# Patient Record
Sex: Male | Born: 2000 | Race: White | Hispanic: No | Marital: Single | Smoking: Current some day smoker
Health system: Southern US, Community
[De-identification: ages and names within clinical notes are randomized; demographics above are authoritative.]

## PROBLEM LIST (undated history)

## (undated) DIAGNOSIS — J45909 Unspecified asthma, uncomplicated: Secondary | ICD-10-CM

## (undated) DIAGNOSIS — Z8489 Family history of other specified conditions: Secondary | ICD-10-CM

## (undated) DIAGNOSIS — K219 Gastro-esophageal reflux disease without esophagitis: Secondary | ICD-10-CM

---
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
## 2023-08-25 ENCOUNTER — Ambulatory Visit: Admitting: Podiatry

## 2024-01-24 ENCOUNTER — Other Ambulatory Visit: Payer: Self-pay

## 2024-01-24 ENCOUNTER — Encounter (HOSPITAL_COMMUNITY): Payer: Self-pay

## 2024-01-24 ENCOUNTER — Emergency Department (HOSPITAL_COMMUNITY)

## 2024-01-24 DIAGNOSIS — E872 Acidosis, unspecified: Secondary | ICD-10-CM | POA: Diagnosis present

## 2024-01-24 DIAGNOSIS — S82401B Unspecified fracture of shaft of right fibula, initial encounter for open fracture type I or II: Secondary | ICD-10-CM | POA: Diagnosis present

## 2024-01-24 DIAGNOSIS — S93122A Dislocation of metatarsophalangeal joint of left great toe, initial encounter: Secondary | ICD-10-CM | POA: Diagnosis present

## 2024-01-24 DIAGNOSIS — S8252XA Displaced fracture of medial malleolus of left tibia, initial encounter for closed fracture: Secondary | ICD-10-CM | POA: Diagnosis present

## 2024-01-24 DIAGNOSIS — T07XXXA Unspecified multiple injuries, initial encounter: Secondary | ICD-10-CM | POA: Diagnosis not present

## 2024-01-24 DIAGNOSIS — D72829 Elevated white blood cell count, unspecified: Secondary | ICD-10-CM | POA: Diagnosis present

## 2024-01-24 DIAGNOSIS — S72001A Fracture of unspecified part of neck of right femur, initial encounter for closed fracture: Secondary | ICD-10-CM | POA: Diagnosis not present

## 2024-01-24 DIAGNOSIS — S72322A Displaced transverse fracture of shaft of left femur, initial encounter for closed fracture: Secondary | ICD-10-CM | POA: Diagnosis present

## 2024-01-24 DIAGNOSIS — S36031A Moderate laceration of spleen, initial encounter: Secondary | ICD-10-CM | POA: Diagnosis present

## 2024-01-24 DIAGNOSIS — K5900 Constipation, unspecified: Secondary | ICD-10-CM | POA: Diagnosis not present

## 2024-01-24 DIAGNOSIS — S2243XA Multiple fractures of ribs, bilateral, initial encounter for closed fracture: Secondary | ICD-10-CM | POA: Diagnosis present

## 2024-01-24 DIAGNOSIS — S32059A Unspecified fracture of fifth lumbar vertebra, initial encounter for closed fracture: Secondary | ICD-10-CM | POA: Diagnosis present

## 2024-01-24 DIAGNOSIS — E559 Vitamin D deficiency, unspecified: Secondary | ICD-10-CM | POA: Diagnosis present

## 2024-01-24 DIAGNOSIS — R339 Retention of urine, unspecified: Secondary | ICD-10-CM | POA: Diagnosis not present

## 2024-01-24 DIAGNOSIS — S72351A Displaced comminuted fracture of shaft of right femur, initial encounter for closed fracture: Secondary | ICD-10-CM | POA: Diagnosis present

## 2024-01-24 DIAGNOSIS — S72401A Unspecified fracture of lower end of right femur, initial encounter for closed fracture: Secondary | ICD-10-CM

## 2024-01-24 DIAGNOSIS — Z91040 Latex allergy status: Secondary | ICD-10-CM

## 2024-01-24 DIAGNOSIS — N179 Acute kidney failure, unspecified: Secondary | ICD-10-CM | POA: Diagnosis present

## 2024-01-24 DIAGNOSIS — R000 Tachycardia, unspecified: Secondary | ICD-10-CM | POA: Diagnosis not present

## 2024-01-24 DIAGNOSIS — Y92410 Unspecified street and highway as the place of occurrence of the external cause: Secondary | ICD-10-CM | POA: Diagnosis not present

## 2024-01-24 DIAGNOSIS — D696 Thrombocytopenia, unspecified: Secondary | ICD-10-CM | POA: Diagnosis not present

## 2024-01-24 DIAGNOSIS — S82201B Unspecified fracture of shaft of right tibia, initial encounter for open fracture type I or II: Secondary | ICD-10-CM

## 2024-01-24 DIAGNOSIS — S92322A Displaced fracture of second metatarsal bone, left foot, initial encounter for closed fracture: Secondary | ICD-10-CM | POA: Diagnosis present

## 2024-01-24 DIAGNOSIS — M79606 Pain in leg, unspecified: Secondary | ICD-10-CM | POA: Diagnosis not present

## 2024-01-24 DIAGNOSIS — S27321A Contusion of lung, unilateral, initial encounter: Secondary | ICD-10-CM | POA: Diagnosis present

## 2024-01-24 DIAGNOSIS — F418 Other specified anxiety disorders: Secondary | ICD-10-CM | POA: Diagnosis not present

## 2024-01-24 DIAGNOSIS — D62 Acute posthemorrhagic anemia: Secondary | ICD-10-CM | POA: Diagnosis present

## 2024-01-24 DIAGNOSIS — S92332A Displaced fracture of third metatarsal bone, left foot, initial encounter for closed fracture: Secondary | ICD-10-CM | POA: Diagnosis present

## 2024-01-24 DIAGNOSIS — E861 Hypovolemia: Secondary | ICD-10-CM | POA: Diagnosis present

## 2024-01-24 DIAGNOSIS — S42322A Displaced transverse fracture of shaft of humerus, left arm, initial encounter for closed fracture: Secondary | ICD-10-CM | POA: Diagnosis present

## 2024-01-24 DIAGNOSIS — R52 Pain, unspecified: Secondary | ICD-10-CM | POA: Diagnosis not present

## 2024-01-24 DIAGNOSIS — I959 Hypotension, unspecified: Secondary | ICD-10-CM | POA: Diagnosis present

## 2024-01-24 DIAGNOSIS — Z79899 Other long term (current) drug therapy: Secondary | ICD-10-CM

## 2024-01-24 DIAGNOSIS — K5901 Slow transit constipation: Secondary | ICD-10-CM | POA: Diagnosis not present

## 2024-01-24 DIAGNOSIS — S8251XB Displaced fracture of medial malleolus of right tibia, initial encounter for open fracture type I or II: Secondary | ICD-10-CM | POA: Diagnosis present

## 2024-01-24 DIAGNOSIS — S82041A Displaced comminuted fracture of right patella, initial encounter for closed fracture: Secondary | ICD-10-CM | POA: Diagnosis present

## 2024-01-24 DIAGNOSIS — S82142A Displaced bicondylar fracture of left tibia, initial encounter for closed fracture: Secondary | ICD-10-CM | POA: Diagnosis present

## 2024-01-24 DIAGNOSIS — S36892A Contusion of other intra-abdominal organs, initial encounter: Secondary | ICD-10-CM | POA: Diagnosis present

## 2024-01-24 DIAGNOSIS — S36039A Unspecified laceration of spleen, initial encounter: Secondary | ICD-10-CM

## 2024-01-24 DIAGNOSIS — S069XAD Unspecified intracranial injury with loss of consciousness status unknown, subsequent encounter: Secondary | ICD-10-CM | POA: Diagnosis not present

## 2024-01-24 DIAGNOSIS — S42191A Fracture of other part of scapula, right shoulder, initial encounter for closed fracture: Secondary | ICD-10-CM | POA: Diagnosis present

## 2024-01-24 DIAGNOSIS — M21332 Wrist drop, left wrist: Secondary | ICD-10-CM | POA: Diagnosis not present

## 2024-01-24 DIAGNOSIS — S42309A Unspecified fracture of shaft of humerus, unspecified arm, initial encounter for closed fracture: Secondary | ICD-10-CM | POA: Diagnosis present

## 2024-01-24 DIAGNOSIS — F4321 Adjustment disorder with depressed mood: Secondary | ICD-10-CM | POA: Diagnosis not present

## 2024-01-24 DIAGNOSIS — F40240 Claustrophobia: Secondary | ICD-10-CM | POA: Diagnosis present

## 2024-01-24 DIAGNOSIS — Z887 Allergy status to serum and vaccine status: Secondary | ICD-10-CM

## 2024-01-24 DIAGNOSIS — S42302A Unspecified fracture of shaft of humerus, left arm, initial encounter for closed fracture: Secondary | ICD-10-CM

## 2024-01-24 DIAGNOSIS — R4189 Other symptoms and signs involving cognitive functions and awareness: Secondary | ICD-10-CM | POA: Diagnosis present

## 2024-01-24 DIAGNOSIS — S92342A Displaced fracture of fourth metatarsal bone, left foot, initial encounter for closed fracture: Secondary | ICD-10-CM | POA: Diagnosis present

## 2024-01-24 DIAGNOSIS — S72402A Unspecified fracture of lower end of left femur, initial encounter for closed fracture: Secondary | ICD-10-CM

## 2024-01-24 DIAGNOSIS — G5632 Lesion of radial nerve, left upper limb: Secondary | ICD-10-CM | POA: Diagnosis present

## 2024-01-24 DIAGNOSIS — F17290 Nicotine dependence, other tobacco product, uncomplicated: Secondary | ICD-10-CM | POA: Diagnosis present

## 2024-01-24 HISTORY — DX: Family history of other specified conditions: Z84.89

## 2024-01-24 LAB — COMPREHENSIVE METABOLIC PANEL WITH GFR
ALT: 732 U/L — ABNORMAL HIGH (ref 0–44)
AST: 488 U/L — ABNORMAL HIGH (ref 15–41)
Albumin: 4 g/dL (ref 3.5–5.0)
Alkaline Phosphatase: 68 U/L (ref 38–126)
Anion gap: 16 — ABNORMAL HIGH (ref 5–15)
BUN: 15 mg/dL (ref 6–20)
CO2: 19 mmol/L — ABNORMAL LOW (ref 22–32)
Calcium: 8.2 mg/dL — ABNORMAL LOW (ref 8.9–10.3)
Chloride: 105 mmol/L (ref 98–111)
Creatinine, Ser: 1.46 mg/dL — ABNORMAL HIGH (ref 0.61–1.24)
GFR, Estimated: >60 mL/min
Glucose, Bld: 176 mg/dL — ABNORMAL HIGH (ref 70–99)
Potassium: 3.4 mmol/L — ABNORMAL LOW (ref 3.5–5.1)
Sodium: 139 mmol/L (ref 135–145)
Total Bilirubin: 0.3 mg/dL (ref 0.0–1.2)
Total Protein: 6.2 g/dL — ABNORMAL LOW (ref 6.5–8.1)

## 2024-01-24 LAB — I-STAT CHEM 8, ED
BUN: 16 mg/dL (ref 6–20)
Calcium, Ion: 1.06 mmol/L — ABNORMAL LOW (ref 1.15–1.40)
Chloride: 105 mmol/L (ref 98–111)
Creatinine, Ser: 1.6 mg/dL — ABNORMAL HIGH (ref 0.61–1.24)
Glucose, Bld: 168 mg/dL — ABNORMAL HIGH (ref 70–99)
HCT: 37 % — ABNORMAL LOW (ref 39.0–52.0)
Hemoglobin: 12.6 g/dL — ABNORMAL LOW (ref 13.0–17.0)
Potassium: 3.2 mmol/L — ABNORMAL LOW (ref 3.5–5.1)
Sodium: 141 mmol/L (ref 135–145)
TCO2: 20 mmol/L — ABNORMAL LOW (ref 22–32)

## 2024-01-24 LAB — CBC
HCT: 39.2 % (ref 39.0–52.0)
Hemoglobin: 13 g/dL (ref 13.0–17.0)
MCH: 30.9 pg (ref 26.0–34.0)
MCHC: 33.2 g/dL (ref 30.0–36.0)
MCV: 93.1 fL (ref 80.0–100.0)
Platelets: 287 K/uL (ref 150–400)
RBC: 4.21 MIL/uL — ABNORMAL LOW (ref 4.22–5.81)
RDW: 12.3 % (ref 11.5–15.5)
WBC: 19.6 K/uL — ABNORMAL HIGH (ref 4.0–10.5)
nRBC: 0 % (ref 0.0–0.2)

## 2024-01-24 LAB — ABO/RH: ABO/RH(D): O POS

## 2024-01-24 LAB — ETHANOL: Alcohol, Ethyl (B): <15 mg/dL

## 2024-01-24 LAB — I-STAT CG4 LACTIC ACID, ED: Lactic Acid, Venous: 3.5 mmol/L (ref 0.5–1.9)

## 2024-01-24 LAB — PROTIME-INR
INR: 1.3 — ABNORMAL HIGH (ref 0.8–1.2)
Prothrombin Time: 16.4 s — ABNORMAL HIGH (ref 11.4–15.2)

## 2024-01-24 MED ADMIN — Ondansetron HCl Inj 4 MG/2ML (2 MG/ML): 4 mg | INTRAVENOUS | NDC 60505613005

## 2024-01-24 MED ADMIN — Iohexol IV Soln 350 MG/ML: 100 mL | INTRAVENOUS | NDC 00407141491

## 2024-01-24 MED ADMIN — Cefazolin Sodium-Dextrose IV Solution 2 GM/100ML-4%: 2 g | INTRAVENOUS | NDC 00338350841

## 2024-01-24 MED ADMIN — Fentanyl Citrate PF Soln Prefilled Syringe 50 MCG/ML: 50 ug | INTRAVENOUS | NDC 63323080801

## 2024-01-24 MED ADMIN — Ondansetron HCl Inj 4 MG/2ML (2 MG/ML): 4 mg | INTRAVENOUS | NDC 00409475518

## 2024-01-24 MED ADMIN — Hydromorphone HCl Inj 1 MG/ML: 1 mg | INTRAVENOUS | NDC 76045000901

## 2024-01-24 MED ADMIN — Sodium Chloride IV Soln 0.9%: 10 mL/h | INTRAVENOUS | NDC 00264580200

## 2024-01-24 MED FILL — Polyethylene Glycol 3350 Oral Packet 17 GM: 17.0000 g | ORAL | Qty: 1 | Status: AC

## 2024-01-24 MED FILL — Acetaminophen Tab 500 MG: 1000.0000 mg | ORAL | Qty: 2 | Status: CN

## 2024-01-24 MED FILL — Acetaminophen Tab 500 MG: 1000.0000 mg | ORAL | Qty: 2 | Status: AC

## 2024-01-24 MED FILL — Hydromorphone HCl Inj 1 MG/ML: 1.0000 mg | INTRAMUSCULAR | Qty: 1 | Status: AC

## 2024-01-24 MED FILL — Oxycodone HCl Tab 5 MG: 10.0000 mg | ORAL | Qty: 2 | Status: AC

## 2024-01-24 MED FILL — Oxycodone HCl Tab 5 MG: 10.0000 mg | ORAL | Qty: 2 | Status: CN

## 2024-01-24 MED FILL — Gabapentin Cap 300 MG: 300.0000 mg | ORAL | Qty: 1 | Status: AC

## 2024-01-24 NOTE — ED Notes (Signed)
 Patient transported to CT with RN and cardiac monitoring.

## 2024-01-24 NOTE — Progress Notes (Signed)
 Orthopedic Tech Progress Note Patient Details:  DAAIEL STARLIN 01-Apr-2000 [id number]  Patient ID: [name], male   DOB: [birth year], 23 y.o.   MRN: [id number] I attended trauma page. Chandra Dorn PARAS 01/24/2024, 10:40 PM

## 2024-01-24 NOTE — ED Notes (Signed)
 Family at bedside.
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
## 2024-01-24 NOTE — ED Notes (Signed)
 Back to room.
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
## 2024-01-25 ENCOUNTER — Inpatient Hospital Stay (HOSPITAL_COMMUNITY)

## 2024-01-25 DIAGNOSIS — S72001A Fracture of unspecified part of neck of right femur, initial encounter for closed fracture: Secondary | ICD-10-CM

## 2024-01-25 HISTORY — PX: FEMUR IM NAIL: SHX1597

## 2024-01-25 HISTORY — PX: CLOSED REDUCTION METATARSAL: SHX5774

## 2024-01-25 HISTORY — PX: TIBIA IM NAIL INSERTION: SHX2516

## 2024-01-25 HISTORY — PX: BILATERAL OPEN REDUCTION INTERNAL FIXATION (ORIF) CALCANEUS: SHX6443

## 2024-01-25 LAB — POCT I-STAT 7, (LYTES, BLD GAS, ICA,H+H)
Acid-base deficit: 9 mmol/L — ABNORMAL HIGH (ref 0.0–2.0)
Acid-base deficit: 9 mmol/L — ABNORMAL HIGH (ref 0.0–2.0)
Acid-base deficit: 9 mmol/L — ABNORMAL HIGH (ref 0.0–2.0)
Acid-base deficit: 7 mmol/L — ABNORMAL HIGH (ref 0.0–2.0)
Bicarbonate: 18.5 mmol/L — ABNORMAL LOW (ref 20.0–28.0)
Bicarbonate: 16.8 mmol/L — ABNORMAL LOW (ref 20.0–28.0)
Bicarbonate: 17.4 mmol/L — ABNORMAL LOW (ref 20.0–28.0)
Bicarbonate: 18 mmol/L — ABNORMAL LOW (ref 20.0–28.0)
Calcium, Ion: 1.06 mmol/L — ABNORMAL LOW (ref 1.15–1.40)
Calcium, Ion: 1.16 mmol/L (ref 1.15–1.40)
Calcium, Ion: 1.2 mmol/L (ref 1.15–1.40)
Calcium, Ion: 1.26 mmol/L (ref 1.15–1.40)
HCT: 19 % — ABNORMAL LOW (ref 39.0–52.0)
HCT: 24 % — ABNORMAL LOW (ref 39.0–52.0)
HCT: 25 % — ABNORMAL LOW (ref 39.0–52.0)
HCT: 23 % — ABNORMAL LOW (ref 39.0–52.0)
Hemoglobin: 6.5 g/dL — CL (ref 13.0–17.0)
Hemoglobin: 8.2 g/dL — ABNORMAL LOW (ref 13.0–17.0)
Hemoglobin: 8.5 g/dL — ABNORMAL LOW (ref 13.0–17.0)
Hemoglobin: 7.8 g/dL — ABNORMAL LOW (ref 13.0–17.0)
O2 Saturation: 100 %
O2 Saturation: 100 %
O2 Saturation: 100 %
O2 Saturation: 100 %
Patient temperature: 36
Patient temperature: 36
Patient temperature: 36.2
Patient temperature: 36.3
Potassium: 5.1 mmol/L (ref 3.5–5.1)
Potassium: 5.2 mmol/L — ABNORMAL HIGH (ref 3.5–5.1)
Potassium: 5.2 mmol/L — ABNORMAL HIGH (ref 3.5–5.1)
Potassium: 5 mmol/L (ref 3.5–5.1)
Sodium: 141 mmol/L (ref 135–145)
Sodium: 141 mmol/L (ref 135–145)
Sodium: 141 mmol/L (ref 135–145)
Sodium: 141 mmol/L (ref 135–145)
TCO2: 20 mmol/L — ABNORMAL LOW (ref 22–32)
TCO2: 18 mmol/L — ABNORMAL LOW (ref 22–32)
TCO2: 19 mmol/L — ABNORMAL LOW (ref 22–32)
TCO2: 19 mmol/L — ABNORMAL LOW (ref 22–32)
pCO2 arterial: 43.2 mmHg (ref 32–48)
pCO2 arterial: 35 mmHg (ref 32–48)
pCO2 arterial: 36.3 mmHg (ref 32–48)
pCO2 arterial: 33.8 mmHg (ref 32–48)
pH, Arterial: 7.234 — ABNORMAL LOW (ref 7.35–7.45)
pH, Arterial: 7.284 — ABNORMAL LOW (ref 7.35–7.45)
pH, Arterial: 7.284 — ABNORMAL LOW (ref 7.35–7.45)
pH, Arterial: 7.331 — ABNORMAL LOW (ref 7.35–7.45)
pO2, Arterial: 242 mmHg — ABNORMAL HIGH (ref 83–108)
pO2, Arterial: 244 mmHg — ABNORMAL HIGH (ref 83–108)
pO2, Arterial: 254 mmHg — ABNORMAL HIGH (ref 83–108)
pO2, Arterial: 231 mmHg — ABNORMAL HIGH (ref 83–108)

## 2024-01-25 LAB — CBC
HCT: 32.2 % — ABNORMAL LOW (ref 39.0–52.0)
HCT: 30.4 % — ABNORMAL LOW (ref 39.0–52.0)
Hemoglobin: 10.8 g/dL — ABNORMAL LOW (ref 13.0–17.0)
Hemoglobin: 10.8 g/dL — ABNORMAL LOW (ref 13.0–17.0)
MCH: 30.3 pg (ref 26.0–34.0)
MCH: 29.6 pg (ref 26.0–34.0)
MCHC: 33.5 g/dL (ref 30.0–36.0)
MCHC: 35.5 g/dL (ref 30.0–36.0)
MCV: 90.4 fL (ref 80.0–100.0)
MCV: 83.3 fL (ref 80.0–100.0)
Platelets: 235 K/uL (ref 150–400)
Platelets: 86 K/uL — ABNORMAL LOW (ref 150–400)
RBC: 3.56 MIL/uL — ABNORMAL LOW (ref 4.22–5.81)
RBC: 3.65 MIL/uL — ABNORMAL LOW (ref 4.22–5.81)
RDW: 12.9 % (ref 11.5–15.5)
RDW: 14.1 % (ref 11.5–15.5)
WBC: 21.7 K/uL — ABNORMAL HIGH (ref 4.0–10.5)
WBC: 8.1 K/uL (ref 4.0–10.5)
nRBC: 0 % (ref 0.0–0.2)
nRBC: 0 % (ref 0.0–0.2)

## 2024-01-25 LAB — SURGICAL PCR SCREEN
MRSA, PCR: NEGATIVE
Staphylococcus aureus: NEGATIVE

## 2024-01-25 LAB — BASIC METABOLIC PANEL WITH GFR
Anion gap: 14 (ref 5–15)
BUN: 18 mg/dL (ref 6–20)
CO2: 20 mmol/L — ABNORMAL LOW (ref 22–32)
Calcium: 7.4 mg/dL — ABNORMAL LOW (ref 8.9–10.3)
Chloride: 110 mmol/L (ref 98–111)
Creatinine, Ser: 1.47 mg/dL — ABNORMAL HIGH (ref 0.61–1.24)
GFR, Estimated: >60 mL/min
Glucose, Bld: 149 mg/dL — ABNORMAL HIGH (ref 70–99)
Potassium: 4.3 mmol/L (ref 3.5–5.1)
Sodium: 143 mmol/L (ref 135–145)

## 2024-01-25 LAB — HIV ANTIBODY (ROUTINE TESTING W REFLEX): HIV Screen 4th Generation wRfx: NONREACTIVE

## 2024-01-25 LAB — GLUCOSE, CAPILLARY: Glucose-Capillary: 150 mg/dL — ABNORMAL HIGH (ref 70–99)

## 2024-01-25 LAB — PREPARE RBC (CROSSMATCH)

## 2024-01-25 LAB — CREATININE, SERUM
Creatinine, Ser: 0.98 mg/dL (ref 0.61–1.24)
GFR, Estimated: >60 mL/min

## 2024-01-25 SURGERY — INSERTION, INTRAMEDULLARY ROD, FEMUR, RETROGRADE
Anesthesia: General | Site: Toe | Laterality: Right

## 2024-01-25 MED ADMIN — Oxycodone HCl Tab 5 MG: 10 mg | ORAL | NDC 00406055223

## 2024-01-25 MED ADMIN — henylephrine-NaCl Pref Syr 0.8 MG/10ML-0.9% (80 MCG/ML): 240 ug | INTRAVENOUS | NDC 65302050510

## 2024-01-25 MED ADMIN — henylephrine-NaCl Pref Syr 0.8 MG/10ML-0.9% (80 MCG/ML): 160 ug | INTRAVENOUS | NDC 65302050510

## 2024-01-25 MED ADMIN — Acetaminophen Tab 500 MG: 1000 mg | ORAL | NDC 50580045711

## 2024-01-25 MED ADMIN — Calcium Chloride Inj 10%: 100 mg | INTRAVENOUS | NDC 76329330401

## 2024-01-25 MED ADMIN — Calcium Chloride Inj 10%: 250 mg | INTRAVENOUS | NDC 76329330401

## 2024-01-25 MED ADMIN — Calcium Chloride Inj 10%: 1 g | INTRAVENOUS | NDC 76329330401

## 2024-01-25 MED ADMIN — Gabapentin Cap 300 MG: 300 mg | ORAL | NDC 60687059111

## 2024-01-25 MED ADMIN — Gabapentin Cap 300 MG: 300 mg | ORAL | NDC 16714066202

## 2024-01-25 MED ADMIN — Sodium Chloride Flush IV Soln 0.9%: 10 mL | NDC 8881579121

## 2024-01-25 MED ADMIN — Hydromorphone HCl Inj 1 MG/ML: 1 mg | INTRAVENOUS | NDC 76045000901

## 2024-01-25 MED ADMIN — PROPOFOL 200 MG/20ML IV EMUL: 100 mg | INTRAVENOUS | NDC 00069020910

## 2024-01-25 MED ADMIN — PROPOFOL 200 MG/20ML IV EMUL: 50 mg | INTRAVENOUS | NDC 00069020910

## 2024-01-25 MED ADMIN — Chlorhexidine Gluconate Pads 2%: 6 | TOPICAL | NDC 53462070523

## 2024-01-25 MED ADMIN — Ketamine HCl Soln Pref Syr 50 MG/5ML (10 MG/ML): 50 mg | INTRAVENOUS | NDC 69374098255

## 2024-01-25 MED ADMIN — Cefazolin Sodium-Dextrose IV Solution 2 GM/100ML-4%: 2 g | INTRAVENOUS | NDC 00338350841

## 2024-01-25 MED ADMIN — Fentanyl Citrate Preservative Free (PF) Inj 250 MCG/5ML: 100 ug | INTRAVENOUS | NDC 72572017125

## 2024-01-25 MED ADMIN — Fentanyl Citrate Preservative Free (PF) Inj 250 MCG/5ML: 50 ug | INTRAVENOUS | NDC 72572017125

## 2024-01-25 MED ADMIN — Fentanyl Citrate Preservative Free (PF) Inj 250 MCG/5ML: 150 ug | INTRAVENOUS | NDC 72572017125

## 2024-01-25 MED ADMIN — Midazolam HCl Inj PF 2 MG/2ML (Base Equivalent): 1 mg | INTRAVENOUS | NDC 00409000125

## 2024-01-25 MED ADMIN — Methocarbamol Tab 500 MG: 500 mg | ORAL | NDC 70010075405

## 2024-01-25 MED ADMIN — Rocuronium Bromide IV Soln Pref Syr 100 MG/10ML (10 MG/ML): 30 mg | INTRAVENOUS | NDC 99999070048

## 2024-01-25 MED ADMIN — Rocuronium Bromide IV Soln Pref Syr 100 MG/10ML (10 MG/ML): 40 mg | INTRAVENOUS | NDC 99999070048

## 2024-01-25 MED ADMIN — Rocuronium Bromide IV Soln Pref Syr 100 MG/10ML (10 MG/ML): 60 mg | INTRAVENOUS | NDC 99999070048

## 2024-01-25 MED ADMIN — Ondansetron HCl Inj 4 MG/2ML (2 MG/ML): 4 mg | INTRAVENOUS | NDC 00409475518

## 2024-01-25 MED ADMIN — Ondansetron HCl Inj 4 MG/2ML (2 MG/ML): 4 mg | INTRAVENOUS | NDC 60505613005

## 2024-01-25 MED ADMIN — Lidocaine HCl Local Soln Prefilled Syringe 100 MG/5ML (2%): 100 mg | INTRAVENOUS | NDC 70004072309

## 2024-01-25 MED ADMIN — Hydromorphone HCl Inj 1 MG/ML: 0.5 mg | INTRAVENOUS | NDC 76045000901

## 2024-01-25 MED ADMIN — Sugammadex Sodium IV 200 MG/2ML (Base Equivalent): 400 mg | INTRAVENOUS | NDC 00006542302

## 2024-01-25 MED ADMIN — Sodium Chloride Irrigation Soln 0.9%: 3000 mL | NDC 00338004724

## 2024-01-25 MED ADMIN — Methocarbamol Inj 1000 MG/10ML: 500 mg | INTRAVENOUS | NDC 55150022310

## 2024-01-25 MED ADMIN — Phenylephrine-NaCl IV Solution 20 MG/250ML-0.9%: 50 ug/min | INTRAVENOUS | NDC 70004080840

## 2024-01-25 MED ADMIN — Sodium Chloride Irrigation Soln 0.9%: 1000 mL | NDC 99999050048

## 2024-01-25 MED ADMIN — Dexamethasone Sod Phosphate Preservative Free Inj 10 MG/ML: 10 mg | INTRAVENOUS | NDC 25021005301

## 2024-01-25 MED FILL — Docusate Sodium Cap 100 MG: 100.0000 mg | ORAL | Qty: 1 | Status: AC

## 2024-01-25 SURGICAL SUPPLY — 72 items
BAG COUNTER SPONGE SURGICOUNT (BAG) ×4 IMPLANT
BIT DRILL 2.9 CANN QC NONSTRL (BIT) IMPLANT
BIT DRILL 4.3 FREE (DRILL) IMPLANT
BIT DRILL CALIBRATED 4.3MMX365 (DRILL) IMPLANT
BIT DRILL CROWE PNT TWST 4.5MM (DRILL) IMPLANT
BLADE SURG 10 STRL SS (BLADE) ×4 IMPLANT
BNDG ELASTIC 4X5.8 VLCR NS LF (GAUZE/BANDAGES/DRESSINGS) IMPLANT
BNDG ELASTIC 6INX 5YD STR LF (GAUZE/BANDAGES/DRESSINGS) IMPLANT
BNDG ELASTIC 6X10 VLCR STRL LF (GAUZE/BANDAGES/DRESSINGS) IMPLANT
BRUSH SCRUB EZ 4% CHG (MISCELLANEOUS) ×4 IMPLANT
BRUSH SCRUB EZ PLAIN DRY (MISCELLANEOUS) ×8 IMPLANT
COVER MAYO STAND STRL (DRAPES) ×4 IMPLANT
COVER SURGICAL LIGHT HANDLE (MISCELLANEOUS) ×8 IMPLANT
DRAPE C-ARM 42X72 X-RAY (DRAPES) ×4 IMPLANT
DRAPE C-ARMOR (DRAPES) ×4 IMPLANT
DRAPE HALF SHEET 40X57 (DRAPES) IMPLANT
DRAPE IMP U-DRAPE 54X76 (DRAPES) ×4 IMPLANT
DRAPE INCISE IOBAN 66X45 STRL (DRAPES) ×4 IMPLANT
DRAPE SURG ORHT 6 SPLT 77X108 (DRAPES) ×8 IMPLANT
DRAPE U-SHAPE 47X51 STRL (DRAPES) ×4 IMPLANT
DRESSING MEPILEX FLEX 4X4 (GAUZE/BANDAGES/DRESSINGS) IMPLANT
DRILL SHORT INTERLOCK 4.3 IMPLANT
ELECTRODE REM PT RTRN 9FT ADLT (ELECTROSURGICAL) ×4 IMPLANT
GAUZE SPONGE 4X4 12PLY STRL (GAUZE/BANDAGES/DRESSINGS) ×4 IMPLANT
GAUZE XEROFORM 5X9 LF (GAUZE/BANDAGES/DRESSINGS) IMPLANT
GLOVE BIOGEL PI IND STRL 8 (GLOVE) ×4 IMPLANT
GLOVE BIOGEL PI IND STRL 9 (GLOVE) IMPLANT
GLOVE SKINSENSE STRL SZ7.0 (GLOVE) IMPLANT
GLOVE SURG SS PI 6.5 STRL IVOR (GLOVE) IMPLANT
GLOVE SURG SYN 7.5 PF PI (GLOVE) IMPLANT
GLOVE SURG SYN 8.0 PF PI (GLOVE) IMPLANT
GOWN STRL REUS W/ TWL LRG LVL3 (GOWN DISPOSABLE) ×8 IMPLANT
GOWN STRL REUS W/ TWL XL LVL3 (GOWN DISPOSABLE) ×4 IMPLANT
GUIDEPIN VERSANAIL DSP 3.2X444 (ORTHOPEDIC DISPOSABLE SUPPLIES) IMPLANT
GUIDEWIRE BEAD TIP (WIRE) IMPLANT
GUIDEWIRE BEAD TIP 100 ST (WIRE) IMPLANT
IMMOBILIZER KNEE 20 THIGH 36 (SOFTGOODS) IMPLANT
KIT BASIN OR (CUSTOM PROCEDURE TRAY) ×4 IMPLANT
KIT TURNOVER KIT B (KITS) ×4 IMPLANT
KWIRE ACE 1.6X6 (WIRE) IMPLANT
NAIL FEM RETRO 10.5X380 (Nail) IMPLANT
NAIL TIB AFFIX ST 9X380 (Nail) IMPLANT
PACK ORTHO EXTREMITY (CUSTOM PROCEDURE TRAY) ×4 IMPLANT
PACK UNIVERSAL I (CUSTOM PROCEDURE TRAY) ×4 IMPLANT
PAD ABD 8X10 STRL (GAUZE/BANDAGES/DRESSINGS) IMPLANT
PAD ARMBOARD POSITIONER FOAM (MISCELLANEOUS) ×8 IMPLANT
PADDING CAST ABS COTTON 4X4 ST (CAST SUPPLIES) IMPLANT
PADDING CAST COTTON 6X4 STRL (CAST SUPPLIES) ×4 IMPLANT
PENCIL BUTTON HOLSTER BLD 10FT (ELECTRODE) IMPLANT
SCREW ACE CAN 4.0 40M (Screw) IMPLANT
SCREW CORT AFFIX ST 5X28 (Screw) IMPLANT
SCREW CORT AFFIX ST 5X40 (Screw) IMPLANT
SCREW CORT AFFIX ST 5X44 (Screw) IMPLANT
SCREW CORT TI DBL LEAD 5X32 (Screw) IMPLANT
SCREW CORT TI DBL LEAD 5X44 (Screw) IMPLANT
SCREW CORT TI DBL LEAD 5X56 (Screw) IMPLANT
SCREW CORT TI DBL LEAD 5X85 (Screw) IMPLANT
SCREW CORT TI DBL LEAD 5X90 (Screw) IMPLANT
SPONGE T-LAP 18X18 ~~LOC~~+RFID (SPONGE) ×4 IMPLANT
STAPLER SKIN PROX 35W (STAPLE) ×4 IMPLANT
STOCKINETTE IMPERVIOUS LG (DRAPES) ×4 IMPLANT
SUCTION TUBE FRAZIER 10FR DISP (SUCTIONS) IMPLANT
SUT ETHILON 2 0 FS 18 (SUTURE) ×8 IMPLANT
SUT ETHILON 3 0 PS 1 (SUTURE) IMPLANT
SUT VIC AB 1 CT1 27XBRD ANBCTR (SUTURE) IMPLANT
SUT VIC AB 2-0 CT1 TAPERPNT 27 (SUTURE) ×4 IMPLANT
SUT VIC AB 2-0 CT2 27 (SUTURE) IMPLANT
TOWEL GREEN STERILE (TOWEL DISPOSABLE) ×8 IMPLANT
TOWEL GREEN STERILE FF (TOWEL DISPOSABLE) ×4 IMPLANT
TRAY FOL W/BAG SLVR 16FR STRL (SET/KITS/TRAYS/PACK) IMPLANT
TUBE CONNECTING 12X1/4 (SUCTIONS) ×4 IMPLANT
YANKAUER SUCT BULB TIP NO VENT (SUCTIONS) ×4 IMPLANT

## 2024-01-25 NOTE — Transfer of Care (Signed)
 Immediate Anesthesia Transfer of Care Note  Patient: Aaron Herrera  Procedure(s) Performed: RETROGRADE NAIL FEMUR, BILATERAL (Bilateral: Leg Upper) IM NAIL, TIBIA AND PARTIAL  RIGHT PATELLECTOMY (Right: Leg Lower) CLOSED REDUCTION, FRACTURE, METATARSAL BONES (Left: Toe) (ORIF) MEDIAL MALLEOLUS, RIGHT (Right: Ankle)  Patient Location: PACU  Anesthesia Type:General  Level of Consciousness: sedated, lethargic, and responds to stimulation  Airway & Oxygen Therapy: Patient Spontanous Breathing and Patient connected to face mask oxygen  Post-op Assessment: Report given to RN and Post -op Vital signs reviewed and stable  Post vital signs: Reviewed and stable  Last Vitals:  Vitals Value Taken Time  BP 159/95 01/25/24 14:38  Temp 36.8 C 01/25/24 14:30  Pulse 122 01/25/24 14:39  Resp 13 01/25/24 14:39  SpO2 100 % 01/25/24 14:39  Vitals shown include unfiled device data.  Last Pain:  Vitals:   01/25/24 0624  TempSrc: Oral  PainSc:          Complications:  Encounter Notable Events  Notable Event Outcome Phase Comment  Hypotension Resolved in Lab Intraprocedure

## 2024-01-25 NOTE — Progress Notes (Signed)
 Orthopedic Tech Progress Note Patient Details:  Aaron Herrera 02-25-2000 [id number] Applied CAM walker to RLE after OR nurse requested it Ortho Devices Type of Ortho Device: CAM walker Ortho Device/Splint Location: RLE Ortho Device/Splint Interventions: Ordered, Application   Post Interventions Patient Tolerated: Well Instructions Provided: Care of device, Adjustment of device  Shadaya Marschner A Dannie Woolen 01/25/2024, 5:55 PM
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
## 2024-01-25 NOTE — Interval H&P Note (Signed)
 History and Physical Interval Note:  01/25/2024 7:30 AM  Aaron Herrera  has presented today for surgery, with the diagnosis of Bilateral  femur fractures, Right open tibia fracture, dislocated Left great toe.  The various methods of treatment have been discussed with the patient and family. After consideration of risks, benefits and other options for treatment, the patient has consented to  Procedures with comments: INSERTION, INTRAMEDULLARY ROD, FEMUR, RETROGRADE (Bilateral) INSERTION, INTRAMEDULLARY ROD, TIBIA (Right) CLOSED REDUCTION, FRACTURE, METATARSAL BONE (Left) - CLOSED REDUCTION OF LEFT GREAT TOE as a surgical intervention.  The patient's history has been reviewed, patient examined, no change in status, stable for surgery.  I have reviewed the patient's chart and labs.  Questions were answered to the patient's satisfaction.     Cordella SHAUNNA Rhein

## 2024-01-25 NOTE — Anesthesia Procedure Notes (Signed)
 Arterial Line Insertion Start/End12/30/2025 8:26 AM, 01/25/2024 8:29 AM Performed by: Paul Lamarr BRAVO, MD, anesthesiologist  Patient location: OR. Preanesthetic checklist: patient identified, IV checked, risks and benefits discussed, surgical consent, monitors and equipment checked, pre-op evaluation, timeout performed and anesthesia consent Patient sedated Right, radial was placed Catheter size: 20 G Hand hygiene performed  and maximum sterile barriers used   Attempts: 1 Procedure performed using ultrasound to evaluate access site. Ultrasound Notes:relevant anatomy identified, ultrasound used to visualize needle entry and vessel patent under ultrasound. Following insertion, dressing applied and Biopatch. Post procedure assessment: normal and unchanged  Patient tolerated the procedure well with no immediate complications. Additional procedure comments: No US  image saved.

## 2024-01-25 NOTE — Anesthesia Procedure Notes (Signed)
 Central Venous Catheter Insertion Performed by: Paul Lamarr BRAVO, MD, anesthesiologist Start/End12/30/2025 8:40 AM, 01/25/2024 8:44 AM Patient location: OR. Emergency situation Preanesthetic checklist: patient identified, IV checked, risks and benefits discussed, surgical consent, monitors and equipment checked, pre-op evaluation, timeout performed and anesthesia consent Position: Trendelenburg Patient sedated Hand hygiene performed  and maximum sterile barriers used  Catheter size: 8 Fr Total catheter length 16. Central line was placed.Double lumen Procedure performed using ultrasound to evaluate access site. Ultrasound Notes:relevant anatomy identified, ultrasound used to visualize needle entry, vessel patent under ultrasound and image(s) printed for medical record. Attempts: 1 Following insertion, dressing applied, line sutured and Biopatch. Post procedure assessment: blood return through all ports  Patient tolerated the procedure well with no immediate complications.
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
## 2024-01-25 NOTE — Progress Notes (Addendum)
 Orthopedic Tech Progress Note Patient Details:  AMIL BOUWMAN 2000-09-30 [id number]  Ortho Devices Type of Ortho Device: Coapt, Arm sling Ortho Device/Splint Location: lue Ortho Device/Splint Interventions: Ordered, Adjustment, Application  I applied splint with assist from the ortho dr.  Lucia Interventions Patient Tolerated: Well Instructions Provided: Care of device, Adjustment of device  Chandra Dorn PARAS 01/25/2024, 1:32 AM

## 2024-01-25 NOTE — Anesthesia Procedure Notes (Signed)
 Procedure Name: Intubation Date/Time: 01/25/2024 8:18 AM  Performed by: Chaney Ozell CROME, CRNAPre-anesthesia Checklist: Patient identified, Emergency Drugs available, Suction available and Patient being monitored Patient Re-evaluated:Patient Re-evaluated prior to induction Oxygen Delivery Method: Circle System Utilized Preoxygenation: Pre-oxygenation with 100% oxygen Induction Type: IV induction Ventilation: Mask ventilation without difficulty Laryngoscope Size: Mac and 4 Grade View: Grade I Tube type: Oral Tube size: 8.0 mm Number of attempts: 1 Airway Equipment and Method: Stylet and Video-laryngoscopy Placement Confirmation: ETT inserted through vocal cords under direct vision, positive ETCO2 and breath sounds checked- equal and bilateral Secured at: 23 cm Tube secured with: Tape Dental Injury: Teeth and Oropharynx as per pre-operative assessment
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
## 2024-01-26 DIAGNOSIS — S82142A Displaced bicondylar fracture of left tibia, initial encounter for closed fracture: Secondary | ICD-10-CM

## 2024-01-26 HISTORY — PX: ORIF HUMERUS FRACTURE: SHX2126

## 2024-01-26 HISTORY — PX: ORIF TIBIA PLATEAU: SHX2132

## 2024-01-26 SURGERY — OPEN REDUCTION INTERNAL FIXATION (ORIF) HUMERAL SHAFT FRACTURE
Anesthesia: General | Laterality: Left

## 2024-01-26 MED ADMIN — Oxycodone HCl Tab 5 MG: 10 mg | ORAL | NDC 00406055223

## 2024-01-26 MED ADMIN — Acetaminophen Tab 500 MG: 1000 mg | ORAL | NDC 50580045711

## 2024-01-26 MED ADMIN — Gabapentin Cap 300 MG: 300 mg | ORAL | NDC 16714066202

## 2024-01-26 MED ADMIN — Docusate Sodium Cap 100 MG: 100 mg | ORAL | NDC 00904718361

## 2024-01-26 MED ADMIN — Sodium Chloride Flush IV Soln 0.9%: 10 mL | NDC 8881579121

## 2024-01-26 MED ADMIN — Hydromorphone HCl Inj 1 MG/ML: 1 mg | INTRAVENOUS | NDC 00409128331

## 2024-01-26 MED ADMIN — Hydromorphone HCl Inj 1 MG/ML: 1 mg | INTRAVENOUS | NDC 76045000901

## 2024-01-26 MED ADMIN — Cefazolin Sodium-Dextrose IV Solution 2 GM/100ML-4%: 2 g | INTRAVENOUS | NDC 00338350841

## 2024-01-26 MED ADMIN — Chlorhexidine Gluconate Pads 2%: 6 | TOPICAL | NDC 53462070523

## 2024-01-26 MED ADMIN — Fentanyl Citrate Preservative Free (PF) Inj 250 MCG/5ML: 100 ug | INTRAVENOUS | NDC 72572017125

## 2024-01-26 MED ADMIN — Fentanyl Citrate Preservative Free (PF) Inj 250 MCG/5ML: 50 ug | INTRAVENOUS | NDC 72572017125

## 2024-01-26 MED ADMIN — Rocuronium Bromide IV Soln Pref Syr 100 MG/10ML (10 MG/ML): 100 mg | INTRAVENOUS | NDC 99999070048

## 2024-01-26 MED ADMIN — Vancomycin HCl For Inj 1000 MG: 1000 mg | TOPICAL | NDC 16714030910

## 2024-01-26 MED ADMIN — Lidocaine HCl Local Soln Prefilled Syringe 100 MG/5ML (2%): 80 mg | INTRAVENOUS | NDC 70004072309

## 2024-01-26 MED ADMIN — Methocarbamol Tab 500 MG: 500 mg | ORAL | NDC 70010075405

## 2024-01-26 MED ADMIN — Ondansetron HCl Inj 4 MG/2ML (2 MG/ML): 4 mg | INTRAVENOUS | NDC 60505613005

## 2024-01-26 MED ADMIN — Midazolam HCl Inj PF 2 MG/2ML (Base Equivalent): 2 mg | INTRAVENOUS | NDC 00409000125

## 2024-01-26 MED ADMIN — Phenylephrine-NaCl IV Solution 20 MG/250ML-0.9%: 20 ug/min | INTRAVENOUS | NDC 70004080840

## 2024-01-26 MED ADMIN — henylephrine-NaCl Pref Syr 0.8 MG/10ML-0.9% (80 MCG/ML): 160 ug | INTRAVENOUS | NDC 65302050510

## 2024-01-26 MED ADMIN — henylephrine-NaCl Pref Syr 0.8 MG/10ML-0.9% (80 MCG/ML): 80 ug | INTRAVENOUS | NDC 65302050510

## 2024-01-26 MED ADMIN — Fentanyl Citrate Preservative Free (PF) Inj 100 MCG/2ML: 25 ug | INTRAVENOUS | NDC 72572017001

## 2024-01-26 MED ADMIN — Sugammadex Sodium IV 200 MG/2ML (Base Equivalent): 200 mg | INTRAVENOUS | NDC 00006542302

## 2024-01-26 MED ADMIN — Dexamethasone Sod Phosphate Preservative Free Inj 10 MG/ML: 8 mg | INTRAVENOUS | NDC 25021005301

## 2024-01-26 MED ADMIN — Methocarbamol Inj 1000 MG/10ML: 500 mg | INTRAVENOUS | NDC 55150022310

## 2024-01-26 MED ADMIN — ORAL CARE MOUTH RINSE: 15 mL | OROMUCOSAL | NDC 99999080097

## 2024-01-26 MED ADMIN — PROPOFOL 200 MG/20ML IV EMUL: 100 mg | INTRAVENOUS | NDC 00069020910

## 2024-01-26 MED ADMIN — Sodium Chloride Irrigation Soln 0.9%: 1000 mL | NDC 99999050048

## 2024-01-26 SURGICAL SUPPLY — 81 items
BAG COUNTER SPONGE SURGICOUNT (BAG) ×1 IMPLANT
BIT DRILL QC 2.5MM SHRT EVO SM (DRILL) IMPLANT
BIT DRILL SHORT QC SHORT 3.7 (DRILL) IMPLANT
BLADE CLIPPER SURG (BLADE) IMPLANT
BLADE SURG 15 STRL LF DISP TIS (BLADE) ×1 IMPLANT
BNDG COHESIVE 4X5 TAN STRL LF (GAUZE/BANDAGES/DRESSINGS) ×1 IMPLANT
BNDG COMPR ESMARK 6X3 LF (GAUZE/BANDAGES/DRESSINGS) ×1 IMPLANT
BNDG ELASTIC 4INX 5YD STR LF (GAUZE/BANDAGES/DRESSINGS) ×1 IMPLANT
BNDG ELASTIC 6INX 5YD STR LF (GAUZE/BANDAGES/DRESSINGS) ×1 IMPLANT
BNDG GAUZE DERMACEA FLUFF 4 (GAUZE/BANDAGES/DRESSINGS) ×1 IMPLANT
BRUSH SCRUB EZ PLAIN DRY (MISCELLANEOUS) ×2 IMPLANT
CANISTER SUCTION 3000ML PPV (SUCTIONS) ×1 IMPLANT
CHLORAPREP W/TINT 26 (MISCELLANEOUS) ×2 IMPLANT
COVER SURGICAL LIGHT HANDLE (MISCELLANEOUS) ×2 IMPLANT
CUFF TRNQT CYL 34X4.125X (TOURNIQUET CUFF) ×1 IMPLANT
DERMABOND ADVANCED .7 DNX12 (GAUZE/BANDAGES/DRESSINGS) ×2 IMPLANT
DRAPE C-ARM 42X72 X-RAY (DRAPES) ×1 IMPLANT
DRAPE C-ARMOR (DRAPES) ×1 IMPLANT
DRAPE INCISE IOBAN 66X45 STRL (DRAPES) ×1 IMPLANT
DRAPE SURG 17X23 STRL (DRAPES) ×1 IMPLANT
DRAPE SURG ORHT 6 SPLT 77X108 (DRAPES) ×2 IMPLANT
DRAPE U-SHAPE 47X51 STRL (DRAPES) ×2 IMPLANT
DRESSING MEPILEX FLEX 4X4 (GAUZE/BANDAGES/DRESSINGS) IMPLANT
DRSG MEPILEX POST OP 4X12 (GAUZE/BANDAGES/DRESSINGS) IMPLANT
DRSG MEPILEX POST OP 4X8 (GAUZE/BANDAGES/DRESSINGS) ×1 IMPLANT
DRSG MEPITEL 4X7.2 (GAUZE/BANDAGES/DRESSINGS) IMPLANT
ELECTRODE REM PT RTRN 9FT ADLT (ELECTROSURGICAL) ×1 IMPLANT
EVACUATOR 1/8 PVC DRAIN (DRAIN) IMPLANT
GAUZE PAD ABD 8X10 STRL (GAUZE/BANDAGES/DRESSINGS) ×2 IMPLANT
GAUZE SPONGE 4X4 12PLY STRL (GAUZE/BANDAGES/DRESSINGS) ×1 IMPLANT
GLOVE BIO SURGEON STRL SZ 6.5 (GLOVE) ×3 IMPLANT
GLOVE BIO SURGEON STRL SZ7.5 (GLOVE) ×4 IMPLANT
GLOVE BIOGEL PI IND STRL 6.5 (GLOVE) ×1 IMPLANT
GLOVE BIOGEL PI IND STRL 7.5 (GLOVE) ×1 IMPLANT
GOWN STRL REUS W/ TWL LRG LVL3 (GOWN DISPOSABLE) ×2 IMPLANT
IMMOBILIZER KNEE 22 UNIV (SOFTGOODS) ×1 IMPLANT
KIT BASIN OR (CUSTOM PROCEDURE TRAY) ×1 IMPLANT
KIT TURNOVER KIT B (KITS) ×1 IMPLANT
KWIRE FX150X1.6XTROC PNT (WIRE) IMPLANT
MANIFOLD NEPTUNE II (INSTRUMENTS) ×1 IMPLANT
NDL SUT 6 .5 CRC .975X.05 MAYO (NEEDLE) ×1 IMPLANT
NEEDLE HYPO 25X1 1.5 SAFETY (NEEDLE) ×1 IMPLANT
NEEDLE TAPERED W/ NITINOL LOOP (MISCELLANEOUS) ×1 IMPLANT
PACK ORTHO EXTREMITY (CUSTOM PROCEDURE TRAY) ×1 IMPLANT
PACK TOTAL JOINT (CUSTOM PROCEDURE TRAY) ×1 IMPLANT
PAD ARMBOARD POSITIONER FOAM (MISCELLANEOUS) ×2 IMPLANT
PAD CAST 4YDX4 CTTN HI CHSV (CAST SUPPLIES) ×1 IMPLANT
PADDING CAST COTTON 6X4 STRL (CAST SUPPLIES) ×1 IMPLANT
PLATE LOCK EVOX 4.5X159 9H (Plate) IMPLANT
PLATE TIB EVOS 8H L 3.5X117 (Plate) IMPLANT
SCREW CORT 3.5X32 ST EVOS (Screw) IMPLANT
SCREW CORT 4.5X20 STRL (Screw) IMPLANT
SCREW CORT 4.5X22 STRL (Screw) IMPLANT
SCREW CORTEX 3.5X24MM (Screw) IMPLANT
SCREW CORTEX 3.5X26 (Screw) IMPLANT
SCREW CTX 3.5X36MM EVOS (Screw) IMPLANT
SCREW LOCK 3.5X60MM EVOS (Screw) IMPLANT
SCREW LOCK 3.5X65MM (Screw) IMPLANT
SCREW LOCK ST 3.5X48 (Screw) IMPLANT
SCREW LOCKING 3.5X75 (Screw) IMPLANT
SOLN 0.9% NACL POUR BTL 1000ML (IV SOLUTION) ×1 IMPLANT
SOLN STERILE WATER BTL 1000 ML (IV SOLUTION) ×2 IMPLANT
SPONGE T-LAP 18X18 ~~LOC~~+RFID (SPONGE) ×1 IMPLANT
STAPLER SKIN PROX 35W (STAPLE) ×1 IMPLANT
SUCTION TUBE FRAZIER 10FR DISP (SUCTIONS) ×1 IMPLANT
SUT ETHILON 2 0 FS 18 (SUTURE) ×1 IMPLANT
SUT ETHILON 3 0 PS 1 (SUTURE) ×2 IMPLANT
SUT MNCRL AB 3-0 PS2 18 (SUTURE) ×1 IMPLANT
SUT MNCRL AB 3-0 PS2 27 (SUTURE) IMPLANT
SUT MON AB 2-0 CT1 36 (SUTURE) IMPLANT
SUT VIC AB 0 CT1 27XBRD ANBCTR (SUTURE) ×2 IMPLANT
SUT VIC AB 1 CT1 18XCR BRD 8 (SUTURE) IMPLANT
SUT VIC AB 1 CT1 27XBRD ANBCTR (SUTURE) ×1 IMPLANT
SUT VIC AB 2-0 CT1 TAPERPNT 27 (SUTURE) ×2 IMPLANT
SUTURE FIBERWR #2 38 T-5 BLUE (SUTURE) IMPLANT
SYR CONTROL 10ML LL (SYRINGE) ×1 IMPLANT
TOWEL GREEN STERILE (TOWEL DISPOSABLE) ×3 IMPLANT
TOWEL GREEN STERILE FF (TOWEL DISPOSABLE) ×1 IMPLANT
TRAY FOLEY MTR SLVR 16FR STAT (SET/KITS/TRAYS/PACK) IMPLANT
TUBE CONNECTING 12X1/4 (SUCTIONS) ×1 IMPLANT
YANKAUER SUCT BULB TIP NO VENT (SUCTIONS) IMPLANT
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
## 2024-01-26 NOTE — Transfer of Care (Signed)
 Immediate Anesthesia Transfer of Care Note  Patient: Aaron Herrera  Procedure(s) Performed: OPEN REDUCTION INTERNAL FIXATION (ORIF) HUMERAL SHAFT FRACTURE (Left) OPEN REDUCTION INTERNAL FIXATION (ORIF) TIBIAL PLATEAU (Left)  Patient Location: PACU  Anesthesia Type:General  Level of Consciousness: drowsy and patient cooperative  Airway & Oxygen Therapy: Patient Spontanous Breathing and Patient connected to face mask oxygen  Post-op Assessment: Report given to RN and Post -op Vital signs reviewed and stable  Post vital signs: Reviewed and stable  Last Vitals:  Vitals Value Taken Time  BP 147/87 01/26/24 14:15  Temp    Pulse 114 01/26/24 14:19  Resp 13 01/26/24 14:19  SpO2 100 % 01/26/24 14:19  Vitals shown include unfiled device data.  Last Pain:  Vitals:   01/26/24 1125  TempSrc:   PainSc: 4       Patients Stated Pain Goal: 0 (01/26/24 1125)  Complications: No notable events documented.

## 2024-01-26 NOTE — TOC CAGE-AID Note (Signed)
 Transition of Care [facility]) - CAGE-AID Screening   Patient Details  Name: Aaron Herrera MRN: [id number] Date of Birth: [birth year]  Transition of Care [facility]) CM/SW Contact:    LEBRON ROCKIE LELON, RN Phone Number: [phone] 01/26/2024, 4:51 PM   Clinical Narrative: Pt just awoke from surgery and is very pleasant and feeling pretty comfortable at this time.  Pt denies illicit drug use aside from occasional marijuana.  Pt admits to drinking occasional alcohol but not daily.  No resources needed at this time. Screening complete.    CAGE-AID Screening:    Have You Ever Felt You Ought to Cut Down on Your Drinking or Drug Use?: No Have People Annoyed You By Critizing Your Drinking Or Drug Use?: No Have You Felt Bad Or Guilty About Your Drinking Or Drug Use?: No Have You Ever Had a Drink or Used Drugs First Thing In The Morning to Steady Your Nerves or to Get Rid of a Hangover?: No CAGE-AID Score: 0  Substance Abuse Education Offered: No

## 2024-01-26 NOTE — Progress Notes (Signed)
 Orthopaedic Progress Note  S: Patient resting in bed.  He complains of pain in his left arm as well as in his bilateral knees.  Overall this is tolerable for him.  He is otherwise awake alert and interactive  O:  Vitals:   01/26/24 0600 01/26/24 0615  BP: 127/87 112/77  Pulse: (!) 105 (!) 110  Resp: 16 20  Temp:    SpO2: 95% 96%    No acute distress.  Resting comfortably in bed.  Left arm is held in a sling.  He is diffusely decreased sensation in the radial, ulnar, median nerve distributions.  He has 0 out of 5 strength in the PIN.  3-4 strength in the AIN and ulnar nerve distributions  Examination of his bilateral legs reveal the bandages on both legs with knee immobilizers.  He is in a boot on the right side.  10 is a patient with left toes.  Bilaterally he has mildly decreased but there is intact station the saphenous sural, tibial, peroneal nerve distributions.  No pain with passive stretch of the toes.  3-5 strength EHL and FHL bilaterally  Imaging: Postoperative x-rays show couple position of intramedullary rods in the bilateral femurs as well as in the right tibia.  There is hardware in the right medial malleolus.  Partial patellectomy me on the right knee  Labs:  Results for orders placed or performed during the hospital encounter of 01/24/24 (from the past 24 hours)  I-STAT 7, (LYTES, BLD GAS, ICA, H+H)     Status: Abnormal   Collection Time: 01/25/24  9:29 AM  Result Value Ref Range   pH, Arterial 7.234 (L) 7.35 - 7.45   pCO2 arterial 43.2 32 - 48 mmHg   pO2, Arterial 242 (H) 83 - 108 mmHg   Bicarbonate 18.5 (L) 20.0 - 28.0 mmol/L   TCO2 20 (L) 22 - 32 mmol/L   O2 Saturation 100 %   Acid-base deficit 9.0 (H) 0.0 - 2.0 mmol/L   Sodium 141 135 - 145 mmol/L   Potassium 5.1 3.5 - 5.1 mmol/L   Calcium , Ion 1.06 (L) 1.15 - 1.40 mmol/L   HCT 19.0 (L) 39.0 - 52.0 %   Hemoglobin 6.5 (LL) 13.0 - 17.0 g/dL   Patient temperature 63.9 C    Sample type ARTERIAL   Prepare RBC  (crossmatch)     Status: None   Collection Time: 01/25/24  9:32 AM  Result Value Ref Range   Order Confirmation      ORDER PROCESSED BY BLOOD BANK Performed at [facility], [address], [place], [place] [address]   I-STAT 7, (LYTES, BLD GAS, ICA, H+H)     Status: Abnormal   Collection Time: 01/25/24 10:35 AM  Result Value Ref Range   pH, Arterial 7.284 (L) 7.35 - 7.45   pCO2 arterial 36.3 32 - 48 mmHg   pO2, Arterial 244 (H) 83 - 108 mmHg   Bicarbonate 17.4 (L) 20.0 - 28.0 mmol/L   TCO2 19 (L) 22 - 32 mmol/L   O2 Saturation 100 %   Acid-base deficit 9.0 (H) 0.0 - 2.0 mmol/L   Sodium 141 135 - 145 mmol/L   Potassium 5.2 (H) 3.5 - 5.1 mmol/L   Calcium , Ion 1.20 1.15 - 1.40 mmol/L   HCT 24.0 (L) 39.0 - 52.0 %   Hemoglobin 8.2 (L) 13.0 - 17.0 g/dL   Patient temperature 63.9 C    Sample type ARTERIAL   Prepare RBC (crossmatch)  Status: None   Collection Time: 01/25/24 10:40 AM  Result Value Ref Range   Order Confirmation      ORDER PROCESSED BY BLOOD BANK Performed at [facility], [address], [place], [place] [address]   I-STAT 7, (LYTES, BLD GAS, ICA, H+H)     Status: Abnormal   Collection Time: 01/25/24 11:26 AM  Result Value Ref Range   pH, Arterial 7.284 (L) 7.35 - 7.45   pCO2 arterial 35.0 32 - 48 mmHg   pO2, Arterial 254 (H) 83 - 108 mmHg   Bicarbonate 16.8 (L) 20.0 - 28.0 mmol/L   TCO2 18 (L) 22 - 32 mmol/L   O2 Saturation 100 %   Acid-base deficit 9.0 (H) 0.0 - 2.0 mmol/L   Sodium 141 135 - 145 mmol/L   Potassium 5.2 (H) 3.5 - 5.1 mmol/L   Calcium , Ion 1.16 1.15 - 1.40 mmol/L   HCT 25.0 (L) 39.0 - 52.0 %   Hemoglobin 8.5 (L) 13.0 - 17.0 g/dL   Patient temperature 63.7 C    Sample type ARTERIAL   Prepare fresh frozen plasma     Status: None (Preliminary result)   Collection Time: 01/25/24 12:02 PM  Result Value Ref Range   Unit Number [id number]    Blood Component Type THW PLS APHR    Unit division B0    Status of Unit ISSUED     Transfusion Status OK TO TRANSFUSE    Unit Number [id number]    Blood Component Type THW PLS APHR    Unit division A0    Status of Unit ISSUED    Transfusion Status OK TO TRANSFUSE   I-STAT 7, (LYTES, BLD GAS, ICA, H+H)     Status: Abnormal   Collection Time: 01/25/24  1:23 PM  Result Value Ref Range   pH, Arterial 7.331 (L) 7.35 - 7.45   pCO2 arterial 33.8 32 - 48 mmHg   pO2, Arterial 231 (H) 83 - 108 mmHg   Bicarbonate 18.0 (L) 20.0 - 28.0 mmol/L   TCO2 19 (L) 22 - 32 mmol/L   O2 Saturation 100 %   Acid-base deficit 7.0 (H) 0.0 - 2.0 mmol/L   Sodium 141 135 - 145 mmol/L   Potassium 5.0 3.5 - 5.1 mmol/L   Calcium , Ion 1.26 1.15 - 1.40 mmol/L   HCT 23.0 (L) 39.0 - 52.0 %   Hemoglobin 7.8 (L) 13.0 - 17.0 g/dL   Patient temperature 63.6 C    Sample type ARTERIAL   Prepare RBC (crossmatch)     Status: None   Collection Time: 01/25/24  4:00 PM  Result Value Ref Range   Order Confirmation      ORDER PROCESSED BY BLOOD BANK Performed at [facility], [address], [place], [place] [address]   HIV Antibody (routine testing w rflx)     Status: None   Collection Time: 01/25/24  4:30 PM  Result Value Ref Range   HIV Screen 4th Generation wRfx Non Reactive Non Reactive  Creatinine, serum     Status: None   Collection Time: 01/25/24  4:30 PM  Result Value Ref Range   Creatinine, Ser 0.98 0.61 - 1.24 mg/dL   GFR, Estimated >39 >39 mL/min  CBC     Status: Abnormal   Collection Time: 01/25/24  7:59 PM  Result Value Ref Range   WBC 8.1 4.0 - 10.5 K/uL   RBC 3.65 (L) 4.22 - 5.81 MIL/uL   Hemoglobin 10.8 (L) 13.0 -  17.0 g/dL   HCT 69.5 (L) 60.9 - 47.9 %   MCV 83.3 80.0 - 100.0 fL   MCH 29.6 26.0 - 34.0 pg   MCHC 35.5 30.0 - 36.0 g/dL   RDW 85.8 88.4 - 84.4 %   Platelets 86 (L) 150 - 400 K/uL   nRBC 0.0 0.0 - 0.2 %  Glucose, capillary     Status: Abnormal   Collection Time: 01/25/24  8:05 PM  Result Value Ref Range   Glucose-Capillary 150 (H) 70 - 99 mg/dL     Assessment: Postop day 1 status post IM rod of bilateral femurs, I&D and IM rod of right tibia, ORIF right medial malleolus, partial colectomy left leg.  Also close reduction of left toes.  The patient does seem to be overall fairly stable considering this significant large surgeries that he had yesterday.  There is no repeat CBC from this morning and his platelets were slightly low so we will order this this morning.  He is making good urine output.  He does report some chest injection.  Will make sure he has incentive spirometer.  Will also get a chest x-ray to make sure there is nothing more going on by defer to medicine for further management of this.  I discussed with case with Dr. Kendal.  He is hopeful to go take him to the operating today for the left humerus as well as the left to a plateau fracture.  He does have a new left sided wrist drop.  This is likely a neuropraxia injury as he was intact on the night of injury.  I will make sure that Occupational Therapy sees him.  Will make sure that he has a splint for his left wrist.  Injuries: Bilateral femur fractures, right open tibia fracture, left tibial plateau fracture, right medial malleolus fracture, left toe dislocations and metatarsal fractures of the 2nd and 3rd rays, left tibial plateau fracture, left humerus fracture, right scapular fracture  Weightbearing: Nonweightbearing bilateral lower extremities, nonweightbearing left upper extremity.  Weightbearing as hide right upper extremity  Insicional and dressing care: Will start changing dressings tomorrow  Orthopedic device(s): Bilateral knee immobilizers, right ankle brace.  Will get postoperative shoe for the left foot  CV/Blood loss: H&H is stable.  Will continue to monitor  Pain management: Continue his current pain management  VTE prophylaxis: Will start Lovenox  after his surgeries  ID: Continue IV antibiotics for the open fracture.  This may discontinued  tomorrow.  Foley/Lines: Continue with Foley until after surgery.  Can DC per trauma.  Continue those lines per trauma    Dispo: Likely require rehab placement     Cordella Rhein, MD, MS [facility] / [place] [phone]
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
## 2024-01-26 NOTE — Progress Notes (Signed)
 Orthopedic Tech Progress Note Patient Details:  BRAXEN DOBEK [birth year] [id number]  Ortho Devices Type of Ortho Device: Velcro wrist splint, Postop shoe/boot Ortho Device/Splint Location: LUE, LLE Ortho Device/Splint Interventions: Ordered, Application, Adjustment, Removal  patient stated he was going to have surgery this morning so I showed RN how to apply VELCRO ITEMS once patient returns back to room. reason I removed them I did not want them to be thrown away when patient goes into surgery then OR/RN call needing new ones because they decided to throw them away while in surgery    Post Interventions Patient Tolerated: Fair, Well Instructions Provided: Care of device  Delanna LITTIE Pac 01/26/2024, 8:47 AM
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
## 2024-01-26 NOTE — Plan of Care (Signed)
  Problem: Education: Goal: Knowledge of General Education information will improve Description: Including pain rating scale, medication(s)/side effects and non-pharmacologic comfort measures Outcome: Progressing   Problem: Clinical Measurements: Goal: Ability to maintain clinical measurements within normal limits will improve Outcome: Progressing   Problem: Coping: Goal: Level of anxiety will decrease Outcome: Progressing   
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
## 2024-01-26 NOTE — Anesthesia Postprocedure Evaluation (Signed)
"   Anesthesia Post Note  Patient: Aaron Herrera  Procedure(s) Performed: OPEN REDUCTION INTERNAL FIXATION (ORIF) HUMERAL SHAFT FRACTURE (Left) OPEN REDUCTION INTERNAL FIXATION (ORIF) TIBIAL PLATEAU (Left)     Patient location during evaluation: PACU Anesthesia Type: General Level of consciousness: awake and alert Pain management: pain level controlled Vital Signs Assessment: post-procedure vital signs reviewed and stable Respiratory status: spontaneous breathing, nonlabored ventilation, respiratory function stable and patient connected to nasal cannula oxygen Cardiovascular status: blood pressure returned to baseline and stable Postop Assessment: no apparent nausea or vomiting Anesthetic complications: no   No notable events documented.  Last Vitals:  Vitals:   01/26/24 1445 01/26/24 1500  BP: (!) 146/87 (!) 153/86  Pulse: (!) 123 (!) 121  Resp: 20 13  Temp:  36.7 C  SpO2: 96% 96%    Last Pain:  Vitals:   01/26/24 1500  TempSrc:   PainSc: Asleep                 [name]      "

## 2024-01-26 NOTE — Interval H&P Note (Signed)
 History and Physical Interval Note:  01/26/2024 11:21 AM  Aaron Herrera  has presented today for surgery, with the diagnosis of Left tibial plateau and left humerus fractue.  The various methods of treatment have been discussed with the patient and family. After consideration of risks, benefits and other options for treatment, the patient has consented to  Procedures: OPEN REDUCTION INTERNAL FIXATION (ORIF) HUMERAL SHAFT FRACTURE (Left) OPEN REDUCTION INTERNAL FIXATION (ORIF) TIBIAL PLATEAU (Left) as a surgical intervention.  The patient's history has been reviewed, patient examined, no change in status, stable for surgery.  I have reviewed the patient's chart and labs.  Questions were answered to the patient's satisfaction.     Lilja Soland P Mazal Ebey
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
## 2024-01-26 NOTE — Anesthesia Procedure Notes (Signed)
 Procedure Name: Intubation Date/Time: 01/26/2024 12:06 PM  Performed by: Marva Lonni PARAS, CRNAPre-anesthesia Checklist: Patient identified, Emergency Drugs available, Suction available and Patient being monitored Patient Re-evaluated:Patient Re-evaluated prior to induction Oxygen Delivery Method: Circle System Utilized Preoxygenation: Pre-oxygenation with 100% oxygen Induction Type: IV induction Ventilation: Mask ventilation without difficulty Laryngoscope Size: Mac and 4 Grade View: Grade I Tube type: Oral Tube size: 7.5 mm Number of attempts: 1 Airway Equipment and Method: Stylet Placement Confirmation: ETT inserted through vocal cords under direct vision, positive ETCO2 and breath sounds checked- equal and bilateral Secured at: 22 cm Tube secured with: Tape Dental Injury: Teeth and Oropharynx as per pre-operative assessment

## 2024-01-27 MED ADMIN — Oxycodone HCl Tab 5 MG: 10 mg | ORAL | NDC 00406055223

## 2024-01-27 MED ADMIN — Acetaminophen Tab 500 MG: 1000 mg | ORAL | NDC 50580045711

## 2024-01-27 MED ADMIN — Gabapentin Cap 300 MG: 300 mg | ORAL | NDC 16714066202

## 2024-01-27 MED ADMIN — Docusate Sodium Cap 100 MG: 100 mg | ORAL | NDC 00904718361

## 2024-01-27 MED ADMIN — Sodium Chloride Flush IV Soln 0.9%: 10 mL | NDC 68883060010

## 2024-01-27 MED ADMIN — Sodium Chloride Flush IV Soln 0.9%: 10 mL | NDC 8881579121

## 2024-01-27 MED ADMIN — Hydromorphone HCl Inj 1 MG/ML: 1 mg | INTRAVENOUS | NDC 76045000901

## 2024-01-27 MED ADMIN — Cefazolin Sodium-Dextrose IV Solution 2 GM/100ML-4%: 2 g | INTRAVENOUS | NDC 00338350841

## 2024-01-27 MED ADMIN — Chlorhexidine Gluconate Pads 2%: 6 | TOPICAL | NDC 53462070523

## 2024-01-27 MED ADMIN — Methocarbamol Tab 500 MG: 500 mg | ORAL | NDC 70010075405

## 2024-01-27 NOTE — Progress Notes (Addendum)
" ° °  Inpatient Rehab Admissions Coordinator :  Per therapy recommendations patient was screened for CIR candidacy by Heron Leavell RN MSN. Patient is not yet at a level to tolerate the intensity required to pursue a CIR admit . Noted NWB to 3 extremities. The CIR admissions team will follow and monitor for progress and place a Rehab Consult order if felt to be appropriate. Please contact me with any questions.  Heron Leavell RN MSN Admissions Coordinator [phone]  "

## 2024-01-27 NOTE — Progress Notes (Signed)
 Orthopaedic Progress Note  S: Patient is resting comfortably in bed.  He reports frustration that he still has weakness in his left hand.  His pain is overall tolerable  O:  Vitals:   01/27/24 1200 01/27/24 1207  BP: 136/86   Pulse: (!) 130   Resp: 14   Temp:  97.9 F (36.6 C)  SpO2: 96%     Bandages on the left upper arm.  Upper arm are soft and compressible.  Diffusely decreased sensation in the radial, ulnar, median nerve distributions.  0/5 motor function in the PIN, 3-4 of 5 strength AIN and ulnar nerve distributions  Clean dressing on his left leg.  Bruising at the toes.  Intact sensation in the saphenous, sural, tibial, and peroneal nerve distributions.  3 out of 5 strength EHL FHL calf is soft and compressible.  Examination of the right lower extremity reveals a knee immobilizer and boot in place.  The open fracture incision is clean and dry with no drainage no signs of erythema.  He has intact sensation in the saphenous, sural, tibial, and peroneal nerve distributions.  4-5 strength EHL FHL calf to soft and compressible    Labs:  Results for orders placed or performed during the hospital encounter of 01/24/24 (from the past 24 hours)  VITAMIN D  25 Hydroxy (Vit-D Deficiency, Fractures)     Status: Abnormal   Collection Time: 01/26/24  3:37 PM  Result Value Ref Range   Vit D, 25-Hydroxy 12.2 (L) 30 - 100 ng/mL  Basic metabolic panel with GFR     Status: Abnormal   Collection Time: 01/27/24  5:29 AM  Result Value Ref Range   Sodium 141 135 - 145 mmol/L   Potassium 4.1 3.5 - 5.1 mmol/L   Chloride 104 98 - 111 mmol/L   CO2 29 22 - 32 mmol/L   Glucose, Bld 141 (H) 70 - 99 mg/dL   BUN 13 6 - 20 mg/dL   Creatinine, Ser 9.17 0.61 - 1.24 mg/dL   Calcium  8.6 (L) 8.9 - 10.3 mg/dL   GFR, Estimated >39 >39 mL/min   Anion gap 8 5 - 15  CBC     Status: Abnormal   Collection Time: 01/27/24  5:29 AM  Result Value Ref Range   WBC 7.1 4.0 - 10.5 K/uL   RBC 2.68 (L) 4.22 - 5.81  MIL/uL   Hemoglobin 8.0 (L) 13.0 - 17.0 g/dL   HCT 77.3 (L) 60.9 - 47.9 %   MCV 84.3 80.0 - 100.0 fL   MCH 29.9 26.0 - 34.0 pg   MCHC 35.4 30.0 - 36.0 g/dL   RDW 85.8 88.4 - 84.4 %   Platelets 83 (L) 150 - 400 K/uL   nRBC 0.0 0.0 - 0.2 %    Assessment: Postop day 1 status post ORIF of left humerus and left tibial plateau, postop day 2 status post intramedullary nail bilateral femurs, I&D and intramedullary nail of right tibia, ORIF right medial malleolus, partial patellectomy right patella fracture, and closed reduction of left 1st through 3rd toes.  Also with right scapular injury  The patient is overall doing relatively well considering his multiple injuries.  He is resting comfortably his pain is controlled.  I will have him start physical therapy.  He is nonweightbearing in his bilateral lower extremities as well as his left upper extremity.  Will start Lovenox  for DVT prophylaxis.  His H&H has been stable but we will continue to monitor this.  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  Will have physical therapy and Occupational Therapy work with him.  Will keep in a splint to prevent stiffness and a wrist drop.  With regards to the toe fractures, these will be treated nonoperatively.  He can use a postop shoe when he is mobilized if he is just laying in bed he does not need to wear it.  With regards to the bandages, we will change the dressing starting tomorrow.  Injuries: Postop day 1 status post ORIF of left humerus and left tibial plateau, postop day 2 status post intramedullary nail bilateral femurs, I&D and intramedullary nail of right tibia, ORIF right medial malleolus, partial patellectomy right patella fracture, and closed reduction of left 1st through 3rd toes  Weightbearing: Nonweightbearing bilateral lower extremities, and left upper extremity.  Weightbearing as tolerated right upper extremity  Insicional and dressing care: Dressing changes  start tomorrow   CV/Blood loss: H&H stable but will monitor  Pain management: To current pain regimen  VTE prophylaxis: Lovenox      Cordella Rhein, MD, MS [facility] / [facility] [phone]
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
## 2024-01-27 NOTE — Anesthesia Postprocedure Evaluation (Signed)
"   Anesthesia Post Note  Patient: Aaron Herrera  Procedure(s) Performed: RETROGRADE NAIL FEMUR, BILATERAL (Bilateral: Leg Upper) IM NAIL, TIBIA AND PARTIAL  RIGHT PATELLECTOMY (Right: Leg Lower) CLOSED REDUCTION, FRACTURE, METATARSAL BONES (Left: Toe) (ORIF) MEDIAL MALLEOLUS, RIGHT (Right: Ankle)     Patient location during evaluation: PACU Anesthesia Type: General Level of consciousness: awake and alert Pain management: pain level controlled Vital Signs Assessment: post-procedure vital signs reviewed and stable Respiratory status: spontaneous breathing, nonlabored ventilation and respiratory function stable Cardiovascular status: blood pressure returned to baseline Postop Assessment: no apparent nausea or vomiting Anesthetic complications: no            Vertell Row      "

## 2024-01-28 MED ADMIN — Oxycodone HCl Tab 5 MG: 10 mg | ORAL | NDC 00406055223

## 2024-01-28 MED ADMIN — Acetaminophen Tab 500 MG: 1000 mg | ORAL | NDC 50580045711

## 2024-01-28 MED ADMIN — Acetaminophen Tab 500 MG: 1000 mg | ORAL | NDC 00904672040

## 2024-01-28 MED ADMIN — Nutritional Supplement Liquid: 237 mL | ORAL | NDC 4390018629

## 2024-01-28 MED ADMIN — Gabapentin Cap 300 MG: 300 mg | ORAL | NDC 16714066202

## 2024-01-28 MED ADMIN — Docusate Sodium Cap 100 MG: 100 mg | ORAL | NDC 00904718361

## 2024-01-28 MED ADMIN — Sodium Chloride Flush IV Soln 0.9%: 10 mL | NDC 8881579121

## 2024-01-28 MED ADMIN — Hydromorphone HCl Inj 1 MG/ML: 1 mg | INTRAVENOUS | NDC 76045000901

## 2024-01-28 MED ADMIN — Oxycodone HCl Tab 5 MG: 5 mg | ORAL | NDC 00406055223

## 2024-01-28 MED ADMIN — Chlorhexidine Gluconate Pads 2%: 6 | TOPICAL | NDC 53462070523

## 2024-01-28 MED ADMIN — Tamsulosin HCl Cap 0.4 MG: 0.4 mg | ORAL | NDC 65862059801

## 2024-01-28 MED ADMIN — Methocarbamol Tab 500 MG: 500 mg | ORAL | NDC 70010075405

## 2024-01-28 MED ADMIN — Enoxaparin Sodium Inj Soln Pref Syr 40 MG/0.4ML: 40 mg | SUBCUTANEOUS | NDC 71839011010

## 2024-01-28 MED ADMIN — Potassium Chloride Microencapsulated Crys ER Tab 20 mEq: 20 meq | ORAL | NDC 00245531989

## 2024-01-28 MED FILL — Tamsulosin HCl Cap 0.4 MG: 0.4000 mg | ORAL | Qty: 1 | Status: AC

## 2024-01-28 MED FILL — Enoxaparin Sodium Inj Soln Pref Syr 40 MG/0.4ML: 40.0000 mg | INTRAMUSCULAR | Qty: 0.4 | Status: AC
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
## 2024-01-28 NOTE — Progress Notes (Signed)
" ° °  Inpatient Rehabilitation Admissions Coordinator   Noted updated weight bearing restrictions. I will place rehab consult for assessment for candidacy for possible [facility] admit.  Heron Leavell, RN, MSN Rehab Admissions Coordinator [phone] 01/28/2024 5:41 PM  "

## 2024-01-28 NOTE — Progress Notes (Addendum)
 OT NOTE  Exercises for L UE: Hold your L hand with your R and raise up/ down as tolerated-5x per hour Bend elbow / straighten elbow- 5x per hour Remove wrist brace several times a day and do the prayer stretch Keep fingers straight when resting Incentive spirometer: 10 times per hour Unrestricted range of motion to L arm  R Wrist Cock-up Splint (black) Sleep in splint at night Use as needed during the day   Radial Nerve splint ( custom splint with finger bands) Wear for functional tasks and exercises for 1-2 hour intervals during waking hours or as tolerated If you have questions, please call [phone] OT acute care office [facility], OT/L   Acute OT Clinical Specialist Acute Rehabilitation Services Pager [phone] Office [phone]
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
## 2024-01-28 NOTE — Progress Notes (Cosign Needed)
 OT NOTE  RN STAFF  OT fabricated a custom radial nerve splint for LUE. The splint should only be worn for functional task for maximum 1 hour at a time.    Please check skin each time after removing splints for: pain redness swelling  If any symptoms above present remove splint for 15 minutes. If symptoms continue - keep the splint removed and notify OT staff [phone] immediately.   Keep the UE elevated at all times on pillows / towels.  Splint can be cleaned with warm soapy water and alcohol swab. Splint should not be placed in heat of any kind because the splint with mold into a new shape.    The patient is NWB on LUE at all times.
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
## 2024-01-28 NOTE — Progress Notes (Signed)
" °   01/28/24 1605  Vitals  Temp 98.2 F (36.8 C)  Temp Source Oral  BP 120/63  MAP (mmHg) 79  BP Location Right Arm  BP Method Automatic  Patient Position (if appropriate) Lying  Pulse Rate (!) 114  Pulse Rate Source Monitor  ECG Heart Rate (!) 114  Resp 15  Level of Consciousness  Level of Consciousness Alert  MEWS COLOR  MEWS Score Color Yellow  Oxygen Therapy  SpO2 94 %  O2 Device Nasal Cannula  O2 Flow Rate (L/min) 2 L/min  MEWS Score  MEWS Temp 0  MEWS Systolic 0  MEWS Pulse 2  MEWS RR 0  MEWS LOC 0  MEWS Score 2   Pt admitted to 4[facility] from [facility] ICU. A&O x 4, denies pain at this time. Assessment and VS documented. L wrist splint removed at this time. Pt oriented to unit. Pt parents at bedside.  "

## 2024-01-28 NOTE — Progress Notes (Addendum)
 OT NOTE  R UE radial nerve splint Place the fingers into the white loops first for all 5 fingers with the splint on the dorsal side of the hand Slide the splint loops down the fingers to the MCP Once splint is in place - apply all 3 straps starting at forearm, wrist and then palm     Kreg Sink, OT/L   Acute OT Clinical Specialist Acute Rehabilitation Services Pager [phone] Office [phone]

## 2024-01-28 NOTE — Progress Notes (Signed)
 Trauma Event Note    Rounded on pt: Dozing in bed- states he wants to get up again today- c/o pain with movement. Mother has been at bedside.    Last imported Vital Signs BP (!) 150/88   Pulse (!) 107   Temp 97.9 F (36.6 C) (Oral)   Resp 17   Ht 5' 11 (1.803 m)   Wt 238 lb 1.6 oz (108 kg)   SpO2 100%   BMI 33.21 kg/m   Trending CBC Recent Labs    01/26/24 0845 01/27/24 0529 01/28/24 0644  WBC 8.9 7.1 5.3  HGB 9.7* 8.0* 7.6*  HCT 27.7* 22.6* 22.7*  PLT 81* 83* 100*    Trending Coag's No results for input(s): APTT, INR in the last 72 hours.  Trending BMET Recent Labs    01/25/24 1323 01/25/24 1630 01/27/24 0529 01/28/24 0644  NA 141  --  141 144  K 5.0  --  4.1 3.8  CL  --   --  104 105  CO2  --   --  29 32  BUN  --   --  13 16  CREATININE  --  0.98 0.82 0.72  GLUCOSE  --   --  141* 84      Aaron Herrera  Trauma Response RN  Please call TRN at [phone] for further assistance.
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
## 2024-01-29 MED ADMIN — Oxycodone HCl Tab 5 MG: 10 mg | ORAL | NDC 00406055223

## 2024-01-29 MED ADMIN — Acetaminophen Tab 500 MG: 1000 mg | ORAL | NDC 49348004214

## 2024-01-29 MED ADMIN — Acetaminophen Tab 500 MG: 1000 mg | ORAL | NDC 50580045711

## 2024-01-29 MED ADMIN — Nutritional Supplement Liquid: 237 mL | ORAL | NDC 4390018629

## 2024-01-29 MED ADMIN — Gabapentin Cap 300 MG: 300 mg | ORAL | NDC 16714066202

## 2024-01-29 MED ADMIN — Gabapentin Cap 300 MG: 300 mg | ORAL | NDC 60687059111

## 2024-01-29 MED ADMIN — Docusate Sodium Cap 100 MG: 100 mg | ORAL | NDC 00904718361

## 2024-01-29 MED ADMIN — Sodium Chloride Flush IV Soln 0.9%: 10 mL | NDC 8881579121

## 2024-01-29 MED ADMIN — Hydromorphone HCl Inj 1 MG/ML: 1 mg | INTRAVENOUS | NDC 76045000901

## 2024-01-29 MED ADMIN — Metoprolol Tartrate Tab 25 MG: 25 mg | ORAL | NDC 52817025000

## 2024-01-29 MED ADMIN — Chlorhexidine Gluconate Pads 2%: 6 | TOPICAL | NDC 53462070523

## 2024-01-29 MED ADMIN — Tamsulosin HCl Cap 0.4 MG: 0.4 mg | ORAL | NDC 68382013201

## 2024-01-29 MED ADMIN — Methocarbamol Tab 500 MG: 500 mg | ORAL | NDC 70010075405

## 2024-01-29 MED ADMIN — Enoxaparin Sodium Inj Soln Pref Syr 40 MG/0.4ML: 40 mg | SUBCUTANEOUS | NDC 00781324602

## 2024-01-29 MED ADMIN — Potassium Chloride Powder Packet 20 mEq: 20 meq | ORAL | NDC 00245036089

## 2024-01-29 MED FILL — Metoprolol Tartrate Tab 25 MG: 25.0000 mg | ORAL | Qty: 1 | Status: AC
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
## 2024-01-30 MED ADMIN — Oxycodone HCl Tab 5 MG: 10 mg | ORAL | NDC 00406055223

## 2024-01-30 MED ADMIN — Acetaminophen Tab 500 MG: 1000 mg | ORAL | NDC 50580045711

## 2024-01-30 MED ADMIN — Nutritional Supplement Liquid: 237 mL | ORAL | NDC 4390018629

## 2024-01-30 MED ADMIN — Gabapentin Cap 300 MG: 300 mg | ORAL | NDC 16714066202

## 2024-01-30 MED ADMIN — Gabapentin Cap 300 MG: 300 mg | ORAL | NDC 60687059111

## 2024-01-30 MED ADMIN — Docusate Sodium Cap 100 MG: 100 mg | ORAL | NDC 00904718361

## 2024-01-30 MED ADMIN — Sodium Chloride Flush IV Soln 0.9%: 20 mL | NDC 8881579121

## 2024-01-30 MED ADMIN — Sodium Chloride Flush IV Soln 0.9%: 10 mL | NDC 8881579121

## 2024-01-30 MED ADMIN — Oxycodone HCl Tab 5 MG: 5 mg | ORAL | NDC 00406055223

## 2024-01-30 MED ADMIN — Metoprolol Tartrate Tab 25 MG: 25 mg | ORAL | NDC 52817025000

## 2024-01-30 MED ADMIN — Metoprolol Tartrate Tab 25 MG: 25 mg | ORAL | NDC 51079025501

## 2024-01-30 MED ADMIN — Chlorhexidine Gluconate Pads 2%: 6 | TOPICAL | NDC 53462070523

## 2024-01-30 MED ADMIN — Tamsulosin HCl Cap 0.4 MG: 0.4 mg | ORAL | NDC 68382013201

## 2024-01-30 MED ADMIN — Methocarbamol Tab 500 MG: 500 mg | ORAL | NDC 70010075405

## 2024-01-30 MED ADMIN — Enoxaparin Sodium Inj Soln Pref Syr 40 MG/0.4ML: 40 mg | SUBCUTANEOUS | NDC 00781324664

## 2024-01-30 MED ADMIN — Polyethylene Glycol 3350 Oral Packet 17 GM: 17 g | ORAL | NDC 00904693186

## 2024-01-31 DIAGNOSIS — S069XAD Unspecified intracranial injury with loss of consciousness status unknown, subsequent encounter: Secondary | ICD-10-CM | POA: Diagnosis not present

## 2024-01-31 DIAGNOSIS — T07XXXA Unspecified multiple injuries, initial encounter: Secondary | ICD-10-CM

## 2024-01-31 LAB — BASIC METABOLIC PANEL WITH GFR
Anion gap: 8 (ref 5–15)
BUN: 18 mg/dL (ref 6–20)
CO2: 27 mmol/L (ref 22–32)
Calcium: 8.5 mg/dL — ABNORMAL LOW (ref 8.9–10.3)
Chloride: 102 mmol/L (ref 98–111)
Creatinine, Ser: 0.72 mg/dL (ref 0.61–1.24)
GFR, Estimated: >60 mL/min
Glucose, Bld: 117 mg/dL — ABNORMAL HIGH (ref 70–99)
Potassium: 4.1 mmol/L (ref 3.5–5.1)
Sodium: 137 mmol/L (ref 135–145)

## 2024-01-31 LAB — CBC
HCT: 26.3 % — ABNORMAL LOW (ref 39.0–52.0)
Hemoglobin: 8.8 g/dL — ABNORMAL LOW (ref 13.0–17.0)
MCH: 30 pg (ref 26.0–34.0)
MCHC: 33.5 g/dL (ref 30.0–36.0)
MCV: 89.8 fL (ref 80.0–100.0)
Platelets: 216 K/uL (ref 150–400)
RBC: 2.93 MIL/uL — ABNORMAL LOW (ref 4.22–5.81)
RDW: 13.9 % (ref 11.5–15.5)
WBC: 7.6 K/uL (ref 4.0–10.5)
nRBC: 0.8 % — ABNORMAL HIGH (ref 0.0–0.2)

## 2024-01-31 MED ADMIN — Oxycodone HCl Tab 5 MG: 10 mg | ORAL | NDC 00406055223

## 2024-01-31 MED ADMIN — Acetaminophen Tab 500 MG: 1000 mg | ORAL | NDC 50580045711

## 2024-01-31 MED ADMIN — Acetaminophen Tab 500 MG: 1000 mg | ORAL | NDC 49348004214

## 2024-01-31 MED ADMIN — Nutritional Supplement Liquid: 237 mL | ORAL | NDC 4390018629

## 2024-01-31 MED ADMIN — Gabapentin Cap 300 MG: 300 mg | ORAL | NDC 60687059111

## 2024-01-31 MED ADMIN — Gabapentin Cap 300 MG: 300 mg | ORAL | NDC 16714066202

## 2024-01-31 MED ADMIN — Docusate Sodium Cap 100 MG: 100 mg | ORAL | NDC 00904718361

## 2024-01-31 MED ADMIN — Sodium Chloride Flush IV Soln 0.9%: 10 mL | NDC 8881579121

## 2024-01-31 MED ADMIN — Hydromorphone HCl Inj 1 MG/ML: 1 mg | INTRAVENOUS | NDC 76045000901

## 2024-01-31 MED ADMIN — Metoprolol Tartrate Tab 25 MG: 25 mg | ORAL | NDC 52817025000

## 2024-01-31 MED ADMIN — Chlorhexidine Gluconate Pads 2%: 6 | TOPICAL | NDC 53462070523

## 2024-01-31 MED ADMIN — Tamsulosin HCl Cap 0.4 MG: 0.4 mg | ORAL | NDC 65862059801

## 2024-01-31 MED ADMIN — Methocarbamol Tab 500 MG: 500 mg | ORAL | NDC 70010075405

## 2024-01-31 MED ADMIN — Enoxaparin Sodium Inj Soln Pref Syr 40 MG/0.4ML: 40 mg | SUBCUTANEOUS | NDC 00781324602
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
## 2024-01-31 NOTE — Progress Notes (Signed)
 Inpatient Rehab Coordinator Note:  I met with patient and his mom at bedside to discuss CIR recommendations and goals/expectations of CIR stay.  We reviewed 3 hrs/day of therapy, physician follow up, and average length of stay 2 weeks (dependent upon progress) with goals tbd, likely w/c level.  Plan for pt to discharge to either his parents or grandparents home with 24/7 support from mom/dad.  Dad works early morning until early afternoon and can provide lifting assist for mobility.  Mom can provide supervision and assist for ADLs.  We discussed ramp for home entry and set up for w/c level if bathroom is not accessible.  Await updated therapy notes and PMR consult and I will start auth request today.    Reche Lowers, PT, DPT Admissions Coordinator [phone] 01/31/2024 12:17 PM
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
## 2024-01-31 NOTE — TOC Progression Note (Signed)
 Transition of Care [facility]) - Progression Note    Patient Details  Name: Aaron Herrera MRN: [id number] Date of Birth: 10/03/2000  Transition of Care [facility]) CM/SW Contact  Jak Haggar E Kamillah Didonato, LCSW Phone Number: 01/31/2024, 3:09 PM  Clinical Narrative:    Patient was reviewed in trauma rounds, plan for CIR pending insurance authorization. ICM will continue to follow.   Expected Discharge Plan: IP Rehab Facility Barriers to Discharge: Continued Medical Work up               Expected Discharge Plan and Services   Discharge Planning Services: CM Consult   Living arrangements for the past 2 months: Single Family Home                                       Social Drivers of Health (SDOH) Interventions SDOH Screenings   Food Insecurity: No Food Insecurity (01/25/2024)  Housing: Low Risk (01/25/2024)  Transportation Needs: No Transportation Needs (01/25/2024)  Utilities: Not At Risk (01/25/2024)  Tobacco Use: High Risk (01/26/2024)    Readmission Risk Interventions     No data to display

## 2024-01-31 NOTE — Plan of Care (Signed)

## 2024-02-01 ENCOUNTER — Encounter (HOSPITAL_COMMUNITY): Payer: Self-pay | Admitting: Physical Medicine and Rehabilitation

## 2024-02-01 ENCOUNTER — Other Ambulatory Visit: Payer: Self-pay

## 2024-02-01 ENCOUNTER — Encounter (HOSPITAL_BASED_OUTPATIENT_CLINIC_OR_DEPARTMENT_OTHER): Payer: Self-pay | Admitting: *Deleted

## 2024-02-01 ENCOUNTER — Inpatient Hospital Stay (HOSPITAL_COMMUNITY)
Admission: AD | Admit: 2024-02-01 | Discharge: 2024-02-25 | DRG: 560 | Disposition: A | Discharge status: Home / Self Care | Source: Intra-hospital | Attending: Physical Medicine and Rehabilitation | Admitting: Physical Medicine and Rehabilitation

## 2024-02-01 DIAGNOSIS — S7291XA Unspecified fracture of right femur, initial encounter for closed fracture: Secondary | ICD-10-CM | POA: Diagnosis present

## 2024-02-01 DIAGNOSIS — R5381 Other malaise: Secondary | ICD-10-CM

## 2024-02-01 DIAGNOSIS — N179 Acute kidney failure, unspecified: Secondary | ICD-10-CM | POA: Diagnosis present

## 2024-02-01 DIAGNOSIS — E66811 Obesity, class 1: Secondary | ICD-10-CM | POA: Diagnosis present

## 2024-02-01 DIAGNOSIS — J302 Other seasonal allergic rhinitis: Secondary | ICD-10-CM | POA: Diagnosis present

## 2024-02-01 DIAGNOSIS — S36039A Unspecified laceration of spleen, initial encounter: Secondary | ICD-10-CM | POA: Diagnosis present

## 2024-02-01 DIAGNOSIS — S27329A Contusion of lung, unspecified, initial encounter: Secondary | ICD-10-CM | POA: Diagnosis present

## 2024-02-01 DIAGNOSIS — S7292XA Unspecified fracture of left femur, initial encounter for closed fracture: Secondary | ICD-10-CM | POA: Diagnosis present

## 2024-02-01 DIAGNOSIS — D62 Acute posthemorrhagic anemia: Secondary | ICD-10-CM | POA: Diagnosis present

## 2024-02-01 DIAGNOSIS — S42309A Unspecified fracture of shaft of humerus, unspecified arm, initial encounter for closed fracture: Secondary | ICD-10-CM | POA: Diagnosis present

## 2024-02-01 DIAGNOSIS — S93105A Unspecified dislocation of left toe(s), initial encounter: Secondary | ICD-10-CM | POA: Diagnosis present

## 2024-02-01 DIAGNOSIS — K5900 Constipation, unspecified: Secondary | ICD-10-CM | POA: Diagnosis present

## 2024-02-01 DIAGNOSIS — G5632 Lesion of radial nerve, left upper limb: Secondary | ICD-10-CM | POA: Diagnosis present

## 2024-02-01 DIAGNOSIS — I472 Ventricular tachycardia, unspecified: Secondary | ICD-10-CM | POA: Diagnosis present

## 2024-02-01 DIAGNOSIS — S2249XA Multiple fractures of ribs, unspecified side, initial encounter for closed fracture: Secondary | ICD-10-CM | POA: Diagnosis present

## 2024-02-01 DIAGNOSIS — T07XXXA Unspecified multiple injuries, initial encounter: Principal | ICD-10-CM

## 2024-02-01 DIAGNOSIS — S92302A Fracture of unspecified metatarsal bone(s), left foot, initial encounter for closed fracture: Secondary | ICD-10-CM | POA: Diagnosis present

## 2024-02-01 DIAGNOSIS — S62624A Displaced fracture of medial phalanx of right ring finger, initial encounter for closed fracture: Secondary | ICD-10-CM | POA: Diagnosis present

## 2024-02-01 DIAGNOSIS — S82401B Unspecified fracture of shaft of right fibula, initial encounter for open fracture type I or II: Secondary | ICD-10-CM | POA: Diagnosis present

## 2024-02-01 DIAGNOSIS — S42109A Fracture of unspecified part of scapula, unspecified shoulder, initial encounter for closed fracture: Secondary | ICD-10-CM | POA: Diagnosis present

## 2024-02-01 DIAGNOSIS — R339 Retention of urine, unspecified: Secondary | ICD-10-CM | POA: Diagnosis present

## 2024-02-01 DIAGNOSIS — S82201B Unspecified fracture of shaft of right tibia, initial encounter for open fracture type I or II: Secondary | ICD-10-CM | POA: Diagnosis present

## 2024-02-01 DIAGNOSIS — F4321 Adjustment disorder with depressed mood: Secondary | ICD-10-CM | POA: Diagnosis present

## 2024-02-01 DIAGNOSIS — F411 Generalized anxiety disorder: Secondary | ICD-10-CM | POA: Diagnosis present

## 2024-02-01 DIAGNOSIS — S82132A Displaced fracture of medial condyle of left tibia, initial encounter for closed fracture: Secondary | ICD-10-CM | POA: Diagnosis present

## 2024-02-01 LAB — BASIC METABOLIC PANEL WITH GFR
Anion gap: 12 (ref 5–15)
BUN: 20 mg/dL (ref 6–20)
CO2: 23 mmol/L (ref 22–32)
Calcium: 8.8 mg/dL — ABNORMAL LOW (ref 8.9–10.3)
Chloride: 102 mmol/L (ref 98–111)
Creatinine, Ser: 0.73 mg/dL (ref 0.61–1.24)
GFR, Estimated: >60 mL/min
Glucose, Bld: 103 mg/dL — ABNORMAL HIGH (ref 70–99)
Potassium: 4.3 mmol/L (ref 3.5–5.1)
Sodium: 138 mmol/L (ref 135–145)

## 2024-02-01 LAB — CBC
HCT: 26.2 % — ABNORMAL LOW (ref 39.0–52.0)
Hemoglobin: 8.9 g/dL — ABNORMAL LOW (ref 13.0–17.0)
MCH: 30.2 pg (ref 26.0–34.0)
MCHC: 34 g/dL (ref 30.0–36.0)
MCV: 88.8 fL (ref 80.0–100.0)
Platelets: 263 K/uL (ref 150–400)
RBC: 2.95 MIL/uL — ABNORMAL LOW (ref 4.22–5.81)
RDW: 15.1 % (ref 11.5–15.5)
WBC: 7.4 K/uL (ref 4.0–10.5)
nRBC: 0.7 % — ABNORMAL HIGH (ref 0.0–0.2)

## 2024-02-01 MED ORDER — FLEET ENEMA RE ENEM
1.0000 | ENEMA | Freq: Once | RECTAL | Status: AC | PRN
  Administered 2024-02-06: 1 via RECTAL
  Filled 2024-02-01: qty 1

## 2024-02-01 MED ORDER — HYDROXYZINE HCL 25 MG PO TABS: 25.0000 mg | ORAL_TABLET | Freq: Every day | ORAL | Status: DC

## 2024-02-01 MED ORDER — BISACODYL 10 MG RE SUPP: 10.0000 mg | Freq: Every day | RECTAL | Status: DC | PRN

## 2024-02-01 MED ORDER — POLYETHYLENE GLYCOL 3350 17 G PO PACK
17.0000 g | PACK | Freq: Two times a day (BID) | ORAL | Status: DC
  Administered 2024-02-01 – 2024-02-02 (×2): 17 g via ORAL
  Filled 2024-02-01 (×2): qty 1

## 2024-02-01 MED ORDER — ENOXAPARIN SODIUM 40 MG/0.4ML IJ SOSY
40.0000 mg | PREFILLED_SYRINGE | Freq: Two times a day (BID) | INTRAMUSCULAR | Status: AC
  Administered 2024-02-01 – 2024-02-11 (×21): 40 mg via SUBCUTANEOUS
  Filled 2024-02-01 (×21): qty 0.4

## 2024-02-01 MED ORDER — ENSURE PLUS HIGH PROTEIN PO LIQD
237.0000 mL | Freq: Two times a day (BID) | ORAL | Status: DC
  Administered 2024-02-02 – 2024-02-14 (×22): 237 mL via ORAL

## 2024-02-01 MED ORDER — ACETAMINOPHEN 500 MG PO TABS
1000.0000 mg | ORAL_TABLET | Freq: Four times a day (QID) | ORAL | Status: DC
  Administered 2024-02-01 – 2024-02-03 (×5): 1000 mg via ORAL
  Filled 2024-02-01 (×5): qty 2

## 2024-02-01 MED ORDER — PROCHLORPERAZINE EDISYLATE 10 MG/2ML IJ SOLN: 5.0000 mg | Freq: Four times a day (QID) | INTRAMUSCULAR | Status: DC | PRN

## 2024-02-01 MED ORDER — TAMSULOSIN HCL 0.4 MG PO CAPS
0.4000 mg | ORAL_CAPSULE | Freq: Every day | ORAL | Status: DC
  Administered 2024-02-02: 0.4 mg via ORAL
  Filled 2024-02-01: qty 1

## 2024-02-01 MED ORDER — OXYCODONE HCL 5 MG PO TABS
10.0000 mg | ORAL_TABLET | ORAL | Status: DC | PRN
  Administered 2024-02-02 – 2024-02-03 (×2): 10 mg via ORAL
  Filled 2024-02-01 (×3): qty 2

## 2024-02-01 MED ORDER — METHOCARBAMOL 500 MG PO TABS
500.0000 mg | ORAL_TABLET | Freq: Four times a day (QID) | ORAL | Status: DC | PRN
  Administered 2024-02-02 – 2024-02-03 (×3): 500 mg via ORAL
  Filled 2024-02-01 (×2): qty 1

## 2024-02-01 MED ORDER — HYDROXYZINE HCL 25 MG PO TABS: 25.0000 mg | ORAL_TABLET | Freq: Three times a day (TID) | ORAL | Status: DC | PRN

## 2024-02-01 MED ORDER — PROCHLORPERAZINE MALEATE 5 MG PO TABS
5.0000 mg | ORAL_TABLET | Freq: Four times a day (QID) | ORAL | Status: DC | PRN
  Administered 2024-02-14 – 2024-02-21 (×2): 10 mg via ORAL
  Filled 2024-02-01 (×2): qty 2

## 2024-02-01 MED ORDER — PROCHLORPERAZINE 25 MG RE SUPP: 12.5000 mg | Freq: Four times a day (QID) | RECTAL | Status: DC | PRN

## 2024-02-01 MED ORDER — ENOXAPARIN SODIUM 40 MG/0.4ML IJ SOSY: 40.0000 mg | PREFILLED_SYRINGE | Freq: Two times a day (BID) | INTRAMUSCULAR | Status: DC

## 2024-02-01 MED ORDER — CHLORHEXIDINE GLUCONATE CLOTH 2 % EX PADS
6.0000 | MEDICATED_PAD | Freq: Every day | CUTANEOUS | Status: DC
  Administered 2024-02-02 – 2024-02-05 (×4): 6 via TOPICAL

## 2024-02-01 MED ORDER — DOCUSATE SODIUM 100 MG PO CAPS
100.0000 mg | ORAL_CAPSULE | Freq: Two times a day (BID) | ORAL | Status: DC
  Administered 2024-02-01 – 2024-02-03 (×3): 100 mg via ORAL
  Filled 2024-02-01 (×4): qty 1

## 2024-02-01 MED ORDER — MELATONIN 5 MG PO TABS
5.0000 mg | ORAL_TABLET | Freq: Every evening | ORAL | Status: DC | PRN
  Administered 2024-02-01 – 2024-02-04 (×2): 5 mg via ORAL
  Filled 2024-02-01 (×2): qty 1

## 2024-02-01 MED ORDER — GUAIFENESIN-DM 100-10 MG/5ML PO SYRP: 5.0000 mL | ORAL_SOLUTION | Freq: Four times a day (QID) | ORAL | Status: DC | PRN

## 2024-02-01 MED ORDER — BISACODYL 10 MG RE SUPP
10.0000 mg | Freq: Every day | RECTAL | Status: DC | PRN
  Administered 2024-02-02: 10 mg via RECTAL
  Filled 2024-02-01 (×2): qty 1

## 2024-02-01 MED ORDER — GABAPENTIN 300 MG PO CAPS
300.0000 mg | ORAL_CAPSULE | Freq: Three times a day (TID) | ORAL | Status: DC
  Administered 2024-02-01 – 2024-02-09 (×23): 300 mg via ORAL
  Filled 2024-02-01 (×23): qty 1

## 2024-02-01 MED ORDER — SODIUM CHLORIDE 0.9% FLUSH
10.0000 mL | Freq: Two times a day (BID) | INTRAVENOUS | Status: DC
  Administered 2024-02-01 – 2024-02-12 (×20): 10 mL

## 2024-02-01 MED ORDER — ZINC SULFATE 220 (50 ZN) MG PO CAPS
220.0000 mg | ORAL_CAPSULE | Freq: Every day | ORAL | Status: AC
  Administered 2024-02-01 – 2024-02-14 (×14): 220 mg via ORAL
  Filled 2024-02-01 (×15): qty 1

## 2024-02-01 MED ORDER — ALUM & MAG HYDROXIDE-SIMETH 200-200-20 MG/5ML PO SUSP: 30.0000 mL | ORAL | Status: DC | PRN

## 2024-02-01 MED ORDER — ACETAMINOPHEN 325 MG PO TABS
325.0000 mg | ORAL_TABLET | ORAL | Status: DC | PRN
  Filled 2024-02-01: qty 2

## 2024-02-01 MED ORDER — MAGNESIUM CITRATE PO SOLN
0.5000 | Freq: Once | ORAL | Status: AC
  Administered 2024-02-01: 0.5 via ORAL
  Filled 2024-02-01: qty 296

## 2024-02-01 MED ORDER — METOPROLOL TARTRATE 25 MG PO TABS
25.0000 mg | ORAL_TABLET | Freq: Two times a day (BID) | ORAL | Status: DC
  Administered 2024-02-01 – 2024-02-04 (×7): 25 mg via ORAL
  Filled 2024-02-01 (×8): qty 1

## 2024-02-01 MED ORDER — VITAMIN C 500 MG PO TABS
1000.0000 mg | ORAL_TABLET | Freq: Every day | ORAL | Status: DC
  Administered 2024-02-01 – 2024-02-25 (×25): 1000 mg via ORAL
  Filled 2024-02-01 (×26): qty 2

## 2024-02-01 MED ORDER — HYDROXYZINE HCL 25 MG PO TABS
25.0000 mg | ORAL_TABLET | Freq: Every evening | ORAL | Status: DC | PRN
  Administered 2024-02-01 – 2024-02-18 (×22): 25 mg via ORAL
  Filled 2024-02-01 (×22): qty 1

## 2024-02-01 MED ADMIN — Oxycodone HCl Tab 5 MG: 10 mg | ORAL | NDC 00406055223

## 2024-02-01 MED ADMIN — Oxycodone HCl Tab 5 MG: 10 mg | ORAL | NDC 68084035411

## 2024-02-01 MED ADMIN — Acetaminophen Tab 500 MG: 1000 mg | ORAL | NDC 50580045711

## 2024-02-01 MED ADMIN — Acetaminophen Tab 500 MG: 1000 mg | ORAL | NDC 49348004214

## 2024-02-01 MED ADMIN — Nutritional Supplement Liquid: 237 mL | ORAL | NDC 4390018629

## 2024-02-01 MED ADMIN — Gabapentin Cap 300 MG: 300 mg | ORAL | NDC 60687059111

## 2024-02-01 MED ADMIN — Docusate Sodium Cap 100 MG: 100 mg | ORAL | NDC 00904718361

## 2024-02-01 MED ADMIN — Sodium Chloride Flush IV Soln 0.9%: 10 mL | NDC 8881579121

## 2024-02-01 MED ADMIN — Hydromorphone HCl Inj 1 MG/ML: 1 mg | INTRAVENOUS | NDC 00409128331

## 2024-02-01 MED ADMIN — Hydromorphone HCl Inj 1 MG/ML: 1 mg | INTRAVENOUS | NDC 76045000901

## 2024-02-01 MED ADMIN — Metoprolol Tartrate Tab 25 MG: 25 mg | ORAL | NDC 52817025000

## 2024-02-01 MED ADMIN — Chlorhexidine Gluconate Pads 2%: 6 | TOPICAL | NDC 53462070523

## 2024-02-01 MED ADMIN — Tamsulosin HCl Cap 0.4 MG: 0.4 mg | ORAL | NDC 68382013201

## 2024-02-01 MED ADMIN — Enoxaparin Sodium Inj Soln Pref Syr 40 MG/0.4ML: 40 mg | SUBCUTANEOUS | NDC 00781324664

## 2024-02-01 NOTE — TOC Transition Note (Signed)
 Transition of Care [facility]) - Discharge Note   Patient Details  Name: Aaron Herrera MRN: [id number] Date of Birth: [birth year]  Transition of Care [facility]) CM/SW Contact:  Jillana Selph E Norva Bowe, LCSW Phone Number: 02/01/2024, 3:26 PM   Clinical Narrative:    Patient is discharging to [facility] today.   Final next level of care: IP Rehab Facility Barriers to Discharge: Barriers Resolved   Patient Goals and CMS Choice            Discharge Placement                    Patient and family notified of of transfer: 02/01/24  Discharge Plan and Services Additional resources added to the After Visit Summary for     Discharge Planning Services: CM Consult                                 Social Drivers of Health (SDOH) Interventions SDOH Screenings   Food Insecurity: No Food Insecurity (01/25/2024)  Housing: Low Risk (01/25/2024)  Transportation Needs: No Transportation Needs (01/25/2024)  Utilities: Not At Risk (01/25/2024)  Tobacco Use: High Risk (01/26/2024)     Readmission Risk Interventions     No data to display
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
## 2024-02-01 NOTE — Progress Notes (Signed)
 Inpatient Rehab Admissions Coordinator:    I have insurance approval and a bed available for pt to admit to CIR today. Vertell Pringle, PA-C, in agreement and TOC aware.  I will notify pt/family and make arrangements.    Reche Lowers, PT, DPT Admissions Coordinator [phone] 02/01/2024 3:23 PM

## 2024-02-01 NOTE — Plan of Care (Signed)
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
## 2024-02-01 NOTE — Progress Notes (Signed)
 Inpatient Rehab Admissions Coordinator:   Insurance auth for [employer] pending.  Will follow.   Reche Lowers, PT, DPT Admissions Coordinator [phone] 02/01/2024 11:11 AM

## 2024-02-02 ENCOUNTER — Inpatient Hospital Stay (HOSPITAL_COMMUNITY)

## 2024-02-02 ENCOUNTER — Encounter (HOSPITAL_COMMUNITY): Payer: Self-pay | Admitting: Student

## 2024-02-02 DIAGNOSIS — T07XXXA Unspecified multiple injuries, initial encounter: Secondary | ICD-10-CM | POA: Diagnosis not present

## 2024-02-02 LAB — COMPREHENSIVE METABOLIC PANEL WITH GFR
ALT: 94 U/L — ABNORMAL HIGH (ref 0–44)
AST: 59 U/L — ABNORMAL HIGH (ref 15–41)
Albumin: 3.6 g/dL (ref 3.5–5.0)
Alkaline Phosphatase: 87 U/L (ref 38–126)
Anion gap: 16 — ABNORMAL HIGH (ref 5–15)
BUN: 21 mg/dL — ABNORMAL HIGH (ref 6–20)
CO2: 20 mmol/L — ABNORMAL LOW (ref 22–32)
Calcium: 9.2 mg/dL (ref 8.9–10.3)
Chloride: 102 mmol/L (ref 98–111)
Creatinine, Ser: 0.72 mg/dL (ref 0.61–1.24)
GFR, Estimated: >60 mL/min
Glucose, Bld: 97 mg/dL (ref 70–99)
Potassium: 4.1 mmol/L (ref 3.5–5.1)
Sodium: 138 mmol/L (ref 135–145)
Total Bilirubin: 1.2 mg/dL (ref 0.0–1.2)
Total Protein: 6.5 g/dL (ref 6.5–8.1)

## 2024-02-02 LAB — CBC WITH DIFFERENTIAL/PLATELET
Abs Immature Granulocytes: 0.48 K/uL — ABNORMAL HIGH (ref 0.00–0.07)
Basophils Absolute: 0 K/uL (ref 0.0–0.1)
Basophils Relative: 0 %
Eosinophils Absolute: 0.2 K/uL (ref 0.0–0.5)
Eosinophils Relative: 2 %
HCT: 27.7 % — ABNORMAL LOW (ref 39.0–52.0)
Hemoglobin: 9.2 g/dL — ABNORMAL LOW (ref 13.0–17.0)
Immature Granulocytes: 7 %
Lymphocytes Relative: 27 %
Lymphs Abs: 2 K/uL (ref 0.7–4.0)
MCH: 30.5 pg (ref 26.0–34.0)
MCHC: 33.2 g/dL (ref 30.0–36.0)
MCV: 91.7 fL (ref 80.0–100.0)
Monocytes Absolute: 0.8 K/uL (ref 0.1–1.0)
Monocytes Relative: 11 %
Neutro Abs: 3.9 K/uL (ref 1.7–7.7)
Neutrophils Relative %: 53 %
Platelets: 327 K/uL (ref 150–400)
RBC: 3.02 MIL/uL — ABNORMAL LOW (ref 4.22–5.81)
RDW: 15.6 % — ABNORMAL HIGH (ref 11.5–15.5)
Smear Review: NORMAL
WBC: 7.3 K/uL (ref 4.0–10.5)
nRBC: 1.1 % — ABNORMAL HIGH (ref 0.0–0.2)

## 2024-02-02 MED ORDER — SODIUM CHLORIDE 0.9 % IV BOLUS
500.0000 mL | Freq: Once | INTRAVENOUS | Status: AC
  Administered 2024-02-02: 500 mL via INTRAVENOUS

## 2024-02-02 MED ORDER — TAMSULOSIN HCL 0.4 MG PO CAPS
0.4000 mg | ORAL_CAPSULE | Freq: Every day | ORAL | Status: DC
  Administered 2024-02-03 – 2024-02-11 (×9): 0.4 mg via ORAL
  Filled 2024-02-02 (×9): qty 1

## 2024-02-02 MED ORDER — POLYETHYLENE GLYCOL 3350 17 GM/SCOOP PO POWD
119.0000 g | Freq: Once | ORAL | Status: AC
  Administered 2024-02-02: 119 g via ORAL
  Filled 2024-02-02: qty 119

## 2024-02-02 NOTE — Progress Notes (Signed)
 Inpatient Rehabilitation Admission Medication Review by a Pharmacist  A complete drug regimen review was completed for this patient to identify any potential clinically significant medication issues.  High Risk Drug Classes Is patient taking? Indication by Medication  Antipsychotic Yes, as an intravenous medication Prochlorperazine  - N/V  Anticoagulant Yes Enoxaparin  - VTE ppx  Antibiotic No   Opioid Yes Oxycodone  - pain  Antiplatelet No   Hypoglycemics/insulin No   Vasoactive Medication Yes Metoprolol  - tachycardia Tamsulosin  - urinary retention  Chemotherapy No   Other Yes APAP - pain Maalox - prn indigestion Vitamin C  - supplement Colace - bowel regimen Gabapentin  - pain Guaifenesin  DM - prn cough Hydroxyzine  - anxiety Melatonin - sleep Methocarbamol  - prn muscle spasm Miralax  - bowel regimen Fleet enema - prn constipation Zinc  - supplement     Type of Medication Issue Identified Description of Issue Recommendation(s)  Drug Interaction(s) (clinically significant)     Duplicate Therapy     Allergy     No Medication Administration End Date     Incorrect Dose     Additional Drug Therapy Needed     Significant med changes from prior encounter (inform family/care partners about these prior to discharge).    Other       Clinically significant medication issues were identified that warrant physician communication and completion of prescribed/recommended actions by midnight of the next day:  No  Name of provider notified for urgent issues identified:   Provider Method of Notification:     Pharmacist comments:   Time spent performing this drug regimen review (minutes):  15   Molina Hollenback, Pharm.D., BCPS Clinical Pharmacist Clinical phone for 02/02/2024 from 7:30-3:00 is [phone].  **Pharmacist phone directory can be found on [email or web address] listed under [facility].  02/02/2024 10:09 AM

## 2024-02-02 NOTE — Progress Notes (Signed)
 Orthopedic Tech Progress Note Patient Details:  Aaron Herrera [birth year] [id number]  Patient ID: Aaron Herrera, male   DOB: 19-Nov-2000, 24 y.o.   MRN: [id number] Delivered abdominal binder to bedside. Aaron Herrera 02/02/2024, 5:08 PM
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
## 2024-02-02 NOTE — Plan of Care (Signed)
" °  Problem: RH Problem Solving Goal: LTG Patient will demonstrate problem solving for (SLP) Description: LTG:  Patient will demonstrate problem solving for basic/complex daily situations with cues  (SLP) Flowsheets (Taken 02/02/2024 2112) LTG Patient will demonstrate problem solving for: Modified Independent   Problem: RH Memory Goal: LTG Patient will demonstrate ability for day to day (SLP) Description: LTG:   Patient will demonstrate ability for day to day recall/carryover during cognitive/linguistic activities with assist  (SLP) Flowsheets (Taken 02/02/2024 2112) LTG: Patient will demonstrate ability for day to day recall:  New information  Daily complex information LTG: Patient will demonstrate ability for day to day recall/carryover during cognitive/linguistic activities with assist (SLP): Modified Independent   Problem: RH Attention Goal: LTG Patient will demonstrate this level of attention during functional activites (SLP) Description: LTG:  Patient will will demonstrate this level of attention during functional activites (SLP) Flowsheets (Taken 02/02/2024 2112) Patient will demonstrate during cognitive/linguistic activities the attention type of: Alternating Patient will demonstrate this level of attention during cognitive/linguistic activities in: Controlled LTG: Patient will demonstrate this level of attention during cognitive/linguistic activities with assistance of (SLP): Modified Independent Number of minutes patient will demonstrate attention during cognitive/linguistic activities: 25   Problem: RH Pre-functional/Other (Specify) Goal: RH LTG SLP (Specify) 1 Description: RH LTG SLP (Specify) 1 Flowsheets (Taken 02/02/2024 2112) LTG: Other SLP (Specify) 1: Pt will process complex information @ modI   "

## 2024-02-02 NOTE — Progress Notes (Signed)
 Inpatient Rehabilitation  Patient information reviewed and entered into eRehab system by [facility]. Karen Kays., CCC/SLP, PPS Coordinator.  Information including medical coding, functional ability and quality indicators will be reviewed and updated through discharge.

## 2024-02-02 NOTE — Plan of Care (Signed)
" °  Problem: RH Balance Goal: LTG Patient will maintain dynamic sitting balance (PT) Description: LTG:  Patient will maintain dynamic sitting balance with assistance during mobility activities (PT) Flowsheets (Taken 02/02/2024 1234) LTG: Pt will maintain dynamic sitting balance during mobility activities with:: Independent   Problem: Sit to Stand Goal: LTG:  Patient will perform sit to stand with assistance level (PT) Description: LTG:  Patient will perform sit to stand with assistance level (PT) Flowsheets (Taken 02/02/2024 1234) LTG: PT will perform sit to stand in preparation for functional mobility with assistance level: Supervision/Verbal cueing   Problem: RH Bed Mobility Goal: LTG Patient will perform bed mobility with assist (PT) Description: LTG: Patient will perform bed mobility with assistance, with/without cues (PT). Flowsheets (Taken 02/02/2024 1234) LTG: Pt will perform bed mobility with assistance level of: Supervision/Verbal cueing   Problem: RH Bed to Chair Transfers Goal: LTG Patient will perform bed/chair transfers w/assist (PT) Description: LTG: Patient will perform bed to chair transfers with assistance (PT). Flowsheets (Taken 02/02/2024 1234) LTG: Pt will perform Bed to Chair Transfers with assistance level: Supervision/Verbal cueing   Problem: RH Car Transfers Goal: LTG Patient will perform car transfers with assist (PT) Description: LTG: Patient will perform car transfers with assistance (PT). Flowsheets (Taken 02/02/2024 1234) LTG: Pt will perform car transfers with assist:: Supervision/Verbal cueing   Problem: RH Wheelchair Mobility Goal: LTG Patient will propel w/c in controlled environment (PT) Description: LTG: Patient will propel wheelchair in controlled environment, # of feet with assist (PT) Flowsheets (Taken 02/02/2024 1234) LTG: Pt will propel w/c in controlled environ  assist needed:: Independent with assistive device LTG: Propel w/c distance in controlled  environment: 150' Goal: LTG Patient will propel w/c in home environment (PT) Description: LTG: Patient will propel wheelchair in home environment, # of feet with assistance (PT). Flowsheets (Taken 02/02/2024 1234) LTG: Propel w/c distance in home environment: 32'   "
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
## 2024-02-02 NOTE — Plan of Care (Signed)
" °  Problem: Consults Goal: RH GENERAL PATIENT EDUCATION Description: See Patient Education module for education specifics. Outcome: Progressing Goal: Skin Care Protocol Initiated - if Braden Score 18 or less Description: If consults are not indicated, leave blank or document N/A Outcome: Progressing Goal: Nutrition Consult-if indicated Outcome: Progressing   Problem: RH BOWEL ELIMINATION Goal: RH STG MANAGE BOWEL WITH ASSISTANCE Description: STG Manage Bowel with mod I Assistance. Outcome: Progressing Goal: RH STG MANAGE BOWEL W/MEDICATION W/ASSISTANCE Description: STG Manage Bowel with Medication with mod I Assistance. Outcome: Progressing   Problem: RH BLADDER ELIMINATION Goal: RH STG MANAGE BLADDER WITH ASSISTANCE Description: STG Manage Bladder With min  Assistance Outcome: Progressing Goal: RH STG MANAGE BLADDER WITH MEDICATION WITH ASSISTANCE Description: STG Manage Bladder With Medication With mod I Assistance. Outcome: Progressing Goal: RH STG MANAGE BLADDER WITH EQUIPMENT WITH ASSISTANCE Description: STG Manage Bladder With Equipment With min Assistance Outcome: Progressing   Problem: RH SKIN INTEGRITY Goal: RH STG SKIN FREE OF INFECTION/BREAKDOWN Description: Manage skin without infection with min assist Outcome: Progressing   Problem: RH SAFETY Goal: RH STG ADHERE TO SAFETY PRECAUTIONS W/ASSISTANCE/DEVICE Description: STG Adhere to Safety Precautions With cues Assistance/Device. Outcome: Progressing   Problem: RH PAIN MANAGEMENT Goal: RH STG PAIN MANAGED AT OR BELOW PT'S PAIN GOAL Description: Pain < 4 with prns Outcome: Progressing   Problem: RH KNOWLEDGE DEFICIT GENERAL Goal: RH STG INCREASE KNOWLEDGE OF SELF CARE AFTER HOSPITALIZATION Description: Patient and parents will be able to manage care using educational resources for medications, skin care/ foley care and dietary modification independently Outcome: Progressing   "

## 2024-02-02 NOTE — Plan of Care (Signed)
" °  Problem: RH Balance Goal: LTG: Patient will maintain dynamic sitting balance (OT) Description: LTG:  Patient will maintain dynamic sitting balance with assistance during activities of daily living (OT) Flowsheets (Taken 02/02/2024 1503) LTG: Pt will maintain dynamic sitting balance during ADLs with: Supervision/Verbal cueing   Problem: Sit to Stand Goal: LTG:  Patient will perform sit to stand in prep for activites of daily living with assistance level (OT) Description: LTG:  Patient will perform sit to stand in prep for activites of daily living with assistance level (OT) Flowsheets (Taken 02/02/2024 1503) LTG: PT will perform sit to stand in prep for activites of daily living with assistance level: Maximal Assistance - Patient 25 - 49%   Problem: RH Eating Goal: LTG Patient will perform eating w/assist, cues/equip (OT) Description: LTG: Patient will perform eating with assist, with/without cues using equipment (OT) Flowsheets (Taken 02/02/2024 1503) LTG: Pt will perform eating with assistance level of: Independent with assistive device    Problem: RH Grooming Goal: LTG Patient will perform grooming w/assist,cues/equip (OT) Description: LTG: Patient will perform grooming with assist, with/without cues using equipment (OT) Flowsheets (Taken 02/02/2024 1503) LTG: Pt will perform grooming with assistance level of: Independent with assistive device    Problem: RH Bathing Goal: LTG Patient will bathe all body parts with assist levels (OT) Description: LTG: Patient will bathe all body parts with assist levels (OT) Flowsheets (Taken 02/02/2024 1503) LTG: Pt will perform bathing with assistance level/cueing: Minimal Assistance - Patient > 75%   Problem: RH Dressing Goal: LTG Patient will perform upper body dressing (OT) Description: LTG Patient will perform upper body dressing with assist, with/without cues (OT). Flowsheets (Taken 02/02/2024 1503) LTG: Pt will perform upper body dressing with  assistance level of: Set up assist Goal: LTG Patient will perform lower body dressing w/assist (OT) Description: LTG: Patient will perform lower body dressing with assist, with/without cues in positioning using equipment (OT) Flowsheets (Taken 02/02/2024 1503) LTG: Pt will perform lower body dressing with assistance level of: Minimal Assistance - Patient > 75%   Problem: RH Toileting Goal: LTG Patient will perform toileting task (3/3 steps) with assistance level (OT) Description: LTG: Patient will perform toileting task (3/3 steps) with assistance level (OT)  Flowsheets (Taken 02/02/2024 1503) LTG: Pt will perform toileting task (3/3 steps) with assistance level: Minimal Assistance - Patient > 75%   Problem: RH Toilet Transfers Goal: LTG Patient will perform toilet transfers w/assist (OT) Description: LTG: Patient will perform toilet transfers with assist, with/without cues using equipment (OT) Flowsheets (Taken 02/02/2024 1503) LTG: Pt will perform toilet transfers with assistance level of: Contact Guard/Touching assist   "

## 2024-02-02 NOTE — Progress Notes (Signed)
" °  Chaplain attempted to respond to [facility] consult, but pt had just been placed on bedpan. Will continue to follow.  Alan HERO. Davee Lomax, M.Div. [facility] Chaplain Pager [phone] Office [phone]    02/02/24 1609  Spiritual Encounters  Type of Visit Attempt (pt unavailable)    "
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
## 2024-02-02 NOTE — Progress Notes (Signed)
 Met with patient to review current situation , team conference and plan of care. Reviewed medications, weight bearing restrictions, diet, pain,sleep, incision care and MD follow up after discharge. Continue to follow along to provide educational needs to facilitate preparation for discharge.

## 2024-02-02 NOTE — Progress Notes (Signed)
 Orthopedic Tech Progress Note Patient Details:  Aaron Herrera [birth year] [id number]  Patient ID: [name], male   DOB: 2001/01/16, 24 y.o.   MRN: [id number] Resting hand splint called into [facility] w[facility] Adine MARLA Blush 02/02/2024, 3:44 PM
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
## 2024-02-03 ENCOUNTER — Inpatient Hospital Stay (HOSPITAL_COMMUNITY)

## 2024-02-03 DIAGNOSIS — M79606 Pain in leg, unspecified: Secondary | ICD-10-CM | POA: Diagnosis not present

## 2024-02-03 DIAGNOSIS — T07XXXA Unspecified multiple injuries, initial encounter: Secondary | ICD-10-CM | POA: Diagnosis not present

## 2024-02-03 MED ORDER — OXYCODONE HCL 5 MG PO TABS
5.0000 mg | ORAL_TABLET | Freq: Four times a day (QID) | ORAL | Status: DC | PRN
  Administered 2024-02-04 – 2024-02-09 (×11): 5 mg via ORAL
  Filled 2024-02-03 (×13): qty 1

## 2024-02-03 MED ORDER — SENNOSIDES-DOCUSATE SODIUM 8.6-50 MG PO TABS
1.0000 | ORAL_TABLET | Freq: Two times a day (BID) | ORAL | Status: DC
  Administered 2024-02-03 – 2024-02-04 (×2): 1 via ORAL
  Filled 2024-02-03 (×2): qty 1

## 2024-02-03 MED ORDER — METHOCARBAMOL 500 MG PO TABS
500.0000 mg | ORAL_TABLET | Freq: Four times a day (QID) | ORAL | Status: DC
  Administered 2024-02-03 – 2024-02-08 (×21): 500 mg via ORAL
  Filled 2024-02-03 (×21): qty 1

## 2024-02-03 MED ORDER — OXYCODONE HCL 5 MG PO TABS
5.0000 mg | ORAL_TABLET | Freq: Four times a day (QID) | ORAL | Status: DC
  Administered 2024-02-03 – 2024-02-06 (×12): 5 mg via ORAL
  Filled 2024-02-03 (×11): qty 1

## 2024-02-03 MED ORDER — POLYETHYLENE GLYCOL 3350 17 G PO PACK
17.0000 g | PACK | Freq: Two times a day (BID) | ORAL | Status: DC
  Administered 2024-02-03 – 2024-02-25 (×39): 17 g via ORAL
  Filled 2024-02-03 (×44): qty 1

## 2024-02-03 MED ORDER — ACETAMINOPHEN 500 MG PO TABS
1000.0000 mg | ORAL_TABLET | Freq: Three times a day (TID) | ORAL | Status: DC
  Administered 2024-02-03 – 2024-02-25 (×66): 1000 mg via ORAL
  Filled 2024-02-03 (×66): qty 2

## 2024-02-03 NOTE — Care Management (Signed)
 Inpatient Rehabilitation Center Individual Statement of Services  Patient Name:  Aaron Herrera  Date:  02/03/2024  Welcome to the Inpatient Rehabilitation Center.  Our goal is to provide you with an individualized program based on your diagnosis and situation, designed to meet your specific needs.  With this comprehensive rehabilitation program, you will be expected to participate in at least 3 hours of rehabilitation therapies Monday-Friday, with modified therapy programming on the weekends.  Your rehabilitation program will include the following services:  Physical Therapy (PT), Occupational Therapy (OT), Speech Therapy (ST), 24 hour per day rehabilitation nursing, Therapeutic Recreaction (TR), Psychology, Neuropsychology, Care Coordinator, Rehabilitation Medicine, Nutrition Services, Pharmacy Services, and Other  Weekly team conferences will be held on Tuesday to discuss your progress.  Your Inpatient Rehabilitation Care Coordinator will talk with you frequently to get your input and to update you on team discussions.  Team conferences with you and your family in attendance may also be held.  Expected length of stay: 3-4 weeks  Overall anticipated outcome: Supervision  Depending on your progress and recovery, your program may change. Your Inpatient Rehabilitation Care Coordinator will coordinate services and will keep you informed of any changes. Your Inpatient Rehabilitation Care Coordinator's name and contact numbers are listed  below.  The following services may also be recommended but are not provided by the Inpatient Rehabilitation Center:  Driving Evaluations Home Health Rehabiltiation Services Outpatient Rehabilitation Services Vocational Rehabilitation   Arrangements will be made to provide these services after discharge if needed.  Arrangements include referral to agencies that provide these services.  Your insurance has been verified to be:  [place] [employer]  Your  primary doctor is:  No PCP  Pertinent information will be shared with your doctor and your insurance company.  Inpatient Rehabilitation Care Coordinator:  Graeme Jude, KEN [phone] or [phone]  Information discussed with and copy given to patient by: Graeme DELENA Jude, 02/03/2024, 1:02 PM

## 2024-02-03 NOTE — Progress Notes (Signed)
 Patient ID: Aaron Herrera, male   DOB: 09-04-2000, 24 y.o.   MRN: [id number]  Pt not in room at time of visit. SW provided his mother with Statement of Service. She reports that she would like for him to be here long enough to get better, and have enough time to install the ramp and remodel the bathroom. SW shared will share her concerns with team, and will provide updates after team conference.   Graeme Jude, MSW, LCSW Office: [phone] Cell: [phone] Fax: [phone]

## 2024-02-03 NOTE — Progress Notes (Signed)
 Occupational Therapy Session Note  Patient Details  Name: Aaron Herrera MRN: [id number] Date of Birth: 04-Dec-2000  Today's Date: 02/04/2024 OT Individual Time: 8691-8598 OT Individual Time Calculation (min): 53 min    Short Term Goals: Week 1:  OT Short Term Goal 1 (Week 1): Pt will donn UB shirt with CGA for balance OT Short Term Goal 2 (Week 1): Pt will complete toilet transfer with max A OT Short Term Goal 3 (Week 1): Pt will donn LUE radial nerve palsy splint on LUE with set up A  Skilled Therapeutic Interventions/Progress Updates:   Pt greeted resting in bed, intermediate reports of pain with mobility, rest/positioning provided as needed. Session focused on BADL retraining at bed-level. Pt completes sponge-bathing with assistance for distal LLE, buttock, and thoroughness of RUE. Pt dons top with Min A for lowering material down back. Pt requires Max A for LB garments and Max (x2 for rolling). MD completes rounding during session. OT provides skin care/protection onto LLE incisions. Pt remained resting in bed with all immediate needs met.   Therapy Documentation Precautions:  Precautions Precautions: Fall Precaution/Restrictions Comments: watch BP Required Braces or Orthoses: Knee Immobilizer - Right, Other Brace Knee Immobilizer - Right: On at all times Other Brace: R CAM boot, L post op shoe, L RNP splint, L wrist cock up splint Restrictions Weight Bearing Restrictions Per Provider Order: No RUE Weight Bearing Per Provider Order: Weight bearing as tolerated LUE Weight Bearing Per Provider Order: Weight bearing as tolerated RLE Weight Bearing Per Provider Order: Weight bearing as tolerated LLE Weight Bearing Per Provider Order: Non weight bearing Other Position/Activity Restrictions: no ROM of R knee, NO ROM restrictions L knee, no shoulder ROM restrictions   Therapy/Group: Individual Therapy  Nereida Habermann, OTR/L, MSOT  02/04/2024, 7:55 AM

## 2024-02-03 NOTE — Progress Notes (Signed)
 Speech Language Pathology Daily Session Note  Patient Details  Name: Aaron Herrera MRN: [id number] Date of Birth: [birth year]  Today's Date: 02/03/2024 SLP Individual Time: 9099-9041 SLP Individual Time Calculation (min): 58 min  Short Term Goals: Week 1: SLP Short Term Goal 1 (Week 1): Pt will recall detailed information w/ 90% accuracy given supervisionA SLP Short Term Goal 2 (Week 1): Pt will process complex information w/ supervisionA SLP Short Term Goal 3 (Week 1): Pt will complex cognitive tasks targeting selective attention or greater w/ supervisionA SLP Short Term Goal 4 (Week 1): Pt will solve complex problems w/ supervisionA  Skilled Therapeutic Interventions:  The pt and his mom were greeted at bedside for tx targeting cognition. SLP provided additional education re cognitive domains, emphasizing memory. Introduced [employer] compensatory strategies and provided examples relevant to home/community environment. He was then challenged to a structured recall task w/ 4 unrelated words. Immediately, he was able to recall 4/4 words. After an ~3 min delay, he recalled 4/4 words. 4/4 words also recalled after additional 15 min and 12 min delay. Between recall times, he completed moderately complex deductive reasoning tasks x2. First task completed independently, second one w/ only supervisionA for inferencing. He rated the first task a 4/10 difficulty, second task increasing to 6/10 difficulty. No headache noted w/ cognitive strain, but he did report a little fatigue at the end of tx tasks. SLP provided continued education re cognitive endurance and potential for reduced tolerance s/p MTBI. He was left in bed w/ the alarm set and call light within reach. Mom remained present at bedside as well. Recommend cont ST per POC.    Pain Pain Assessment Pain Scale: 0-10 Pain Score: 4  Pain Type: Acute pain Pain Location: Leg Pain Descriptors / Indicators: Aching Pain Frequency: Intermittent Pain Onset:  Progressive Patients Stated Pain Goal: 2 Pain Intervention(s): Medication (See eMAR)  Therapy/Group: Individual Therapy  [name] 02/03/2024, 9:33 AM

## 2024-02-03 NOTE — Progress Notes (Signed)
 Physical Therapy Session Note  Patient Details  Name: Aaron Herrera MRN: [id number] Date of Birth: [birth year]  Today's Date: 02/03/2024 PT Individual Time: 8994-8884 PT Individual Time Calculation (min): 70 min   Short Term Goals: Week 1:  PT Short Term Goal 1 (Week 1): Pt will complete bed mobility with mod assist PT Short Term Goal 2 (Week 1): Pt will complete transfers with LRAD with mod assist PT Short Term Goal 3 (Week 1): Pt will tolerate OOB 2 hours outside of therapies  Skilled Therapeutic Interventions/Progress Updates:    Pt presents in room in bed, agreeable to PT. Pt does not report pain at rest however does demonstrating pain avoidant behaviors with BLE movements. Session focused on therapeutic activities to improve bed mobility and OOB tolerance. Therapist dons pt pants with total assist and increased time due to pain with BLE movement.  Pt completes rolling to R with mod assist x1 for placement of maxi move pad and pulling pants over hips, rolling to L with max assist x2. Pt then tolerates total assist transfer via maxi move to TIS [facility], therapist managing BLEs to maintain BLE knee extension during transfer and once seated in WC. Pt requires increased time for managing leg rests to ensure comfort in upright position with pt requiring max elevated leg rests and slideboard placed horizontally across leg rests for added support. Pt then transported dependently via WC to day room and vitals assessed to be WNL. Pt then positioned in upright position and pt completes UE exercises to improve cardiac output as well as tolerance to upright. Exercises with 2# bar and included: - bilateral bicep curls x10 - chest press x10 - kayak rows x10 - front raise x10 Vitals assessed following 10 minutes of sitting upright and following exercises, continues to remain WNL with pt denying symptoms. Pt mother educated on tilt function and brake for TIS WC to allow for adjusting tilt with sitting in WC  outside of therapies with pt mother demonstrating understanding. Pt returns to room and remains seated in [facility] with all needs within reach, cal light in place and pt mother at bedside at end of session.   Therapy Documentation Precautions:  Precautions Precautions: Fall Precaution/Restrictions Comments: watch BP Required Braces or Orthoses: Knee Immobilizer - Right, Other Brace Knee Immobilizer - Right: On at all times Other Brace: R CAM boot, L post op shoe, L RNP splint, L wrist cock up splint Restrictions Weight Bearing Restrictions Per Provider Order: No RUE Weight Bearing Per Provider Order: Weight bearing as tolerated LUE Weight Bearing Per Provider Order: Weight bearing as tolerated RLE Weight Bearing Per Provider Order: Weight bearing as tolerated LLE Weight Bearing Per Provider Order: Non weight bearing Other Position/Activity Restrictions: no ROM of R knee, NO ROM restrictions L knee, no shoulder ROM restrictions   Therapy/Group: Individual Therapy  Reche Ohara PT, DPT 02/03/2024, 5:12 PM

## 2024-02-03 NOTE — Progress Notes (Signed)
 " Inpatient Rehabilitation Care Coordinator Assessment and Plan Patient Details  Name: Aaron Herrera MRN: [id number] Date of Birth: April 24, 2000  Today's Date: 02/03/2024  Hospital Problems: Principal Problem:   Critical polytrauma  Past Medical History:  Past Medical History:  Diagnosis Date   Asthma    Family history of adverse reaction to anesthesia    Mom- shaking spells, N/V   GERD (gastroesophageal reflux disease)    Past Surgical History:  Past Surgical History:  Procedure Laterality Date   BILATERAL OPEN REDUCTION INTERNAL FIXATION (ORIF) CALCANEUS Right 01/25/2024   Procedure: (ORIF) MEDIAL MALLEOLUS, RIGHT;  Surgeon: Reyne Cordella SQUIBB, MD;  Location: [facility] OR;  Service: Orthopedics;  Laterality: Right;   CLOSED REDUCTION METATARSAL Left 01/25/2024   Procedure: CLOSED REDUCTION, FRACTURE, METATARSAL BONES;  Surgeon: Reyne Cordella SQUIBB, MD;  Location: [facility] OR;  Service: Orthopedics;  Laterality: Left;  CLOSED REDUCTION OF LEFT GREAT TOE   FEMUR IM NAIL Bilateral 01/25/2024   Procedure: RETROGRADE NAIL FEMUR, BILATERAL;  Surgeon: Reyne Cordella SQUIBB, MD;  Location: [facility] OR;  Service: Orthopedics;  Laterality: Bilateral;   ORIF HUMERUS FRACTURE Left 01/26/2024   Procedure: OPEN REDUCTION INTERNAL FIXATION (ORIF) HUMERAL SHAFT FRACTURE;  Surgeon: Kendal Franky SQUIBB, MD;  Location: [facility] OR;  Service: Orthopedics;  Laterality: Left;   ORIF TIBIA PLATEAU Left 01/26/2024   Procedure: OPEN REDUCTION INTERNAL FIXATION (ORIF) TIBIAL PLATEAU;  Surgeon: Kendal Franky SQUIBB, MD;  Location: [facility] OR;  Service: Orthopedics;  Laterality: Left;   TIBIA IM NAIL INSERTION Right 01/25/2024   Procedure: IM NAIL, TIBIA AND PARTIAL  RIGHT PATELLECTOMY;  Surgeon: Reyne Cordella SQUIBB, MD;  Location: [facility] OR;  Service: Orthopedics;  Laterality: Right;   Social History:  reports that he has been smoking cigars. He has never used smokeless tobacco. He reports that he does not currently use alcohol. He reports that he does not  use drugs.  Family / Support Systems Marital Status: Single Spouse/Significant Other: N/A Children: N/A Other Supports: N/A Anticipated Caregiver: parents Ability/Limitations of Caregiver: D/c to home with parents. Mother can only provide supervision; father works 530am-1pm. Medical Laboratory Scientific Officer: 24/7 Family Dynamics: Lives with parent  Social History Preferred language: English Religion: [employer] - How often do you need to have someone help you when you read instructions, pamphlets, or other written material from your doctor or pharmacy?: Never Writes: Yes   Abuse/Neglect Abuse/Neglect Assessment Can Be Completed: Unable to assess, patient is non-responsive or altered mental status Physical Abuse: Denies Verbal Abuse: Denies Sexual Abuse: Denies Exploitation of patient/patient's resources: Denies Self-Neglect: Denies  Patient response to: Social Isolation - How often do you feel lonely or isolated from those around you?: Patient unable to respond  Emotional Status    Patient / Family Perceptions, Expectations & Goals    Building Surveyor: None Premorbid Home Care/DME Agencies: None Transportation available at discharge: TBD Is the patient able to respond to transportation needs?: Yes In the past 12 months, has lack of transportation kept you from medical appointments or from getting medications?: No In the past 12 months, has lack of transportation kept you from meetings, work, or from getting things needed for daily living?: No Resource referrals recommended: Neuropsychology  Discharge Planning Living Arrangements: Parent Support Systems: Parent Type of Residence: Private residence Insurance Resources: [employer] (specify) ([place] Medicaid Healthy [employer]) Financial Resources: Family Support Financial Screen Referred: No Living Expenses: Lives with family Money Management: Family Does the patient have any problems obtaining  your medications?: No  Care Coordinator Barriers to Discharge: Decreased caregiver support, Lack of/limited family support, Insurance for SNF coverage Care Coordinator Anticipated Follow Up Needs: HH/OP Expected length of stay: ELOS 3-4 weeks.  Clinical Impression SW went by pt room to complete assessment and pt not present.   SW completed chart audit for assessment.   Aaron Herrera 02/03/2024, 3:16 PM    "

## 2024-02-03 NOTE — Progress Notes (Signed)
 Occupational Therapy Session Note  Patient Details  Name: Aaron Herrera MRN: [id number] Date of Birth: 12-11-2000  Today's Date: 02/03/2024 OT Individual Time: 8694-8584 OT Individual Time Calculation (min): 70 min    Short Term Goals: Week 1:  OT Short Term Goal 1 (Week 1): Pt will donn UB shirt with CGA for balance OT Short Term Goal 2 (Week 1): Pt will complete toilet transfer with max A OT Short Term Goal 3 (Week 1): Pt will donn LUE radial nerve palsy splint on LUE with set up A  Skilled Therapeutic Interventions/Progress Updates:   Pt greeted sitting in TIS WC, mother at bedside, reports of pain in RLE due to positioning (rest and re-positioning provided for management). Pt dependent for TIS WC transport from room<>main therapy gym. BLE dependently placed on mat table for comfort/skin integrity management. Attempted forward reach exercise at [name], but patient unable to tolerate this session due to sternum pain and feelings of not being able to breathe. Therefore activity discontinued. Educated patient on WC push-up technique as a means of strengthening and pressure relieving measures. Pt able to reach minimal R-hip elevation, re-educated on using TIS features to relive buttock pain/discomfort. Pt then completes 2x10 (subset in 2x5 reps) of overhead press, chest press, and circumduction with use 1# dowel bar for carryover into functional transfers and ADLs. OT provides stabilization at wrist for neutral alignment and prevention of flexion out of splint. Extensive time required for maximove transfer back to bed due to patient's increased pain with BLE ROM. Pt remained resting in bed with immediate needs met, call bell within reach, and NT present in room.   Therapy Documentation Precautions:  Precautions Precautions: Fall Precaution/Restrictions Comments: watch BP Required Braces or Orthoses: Knee Immobilizer - Right, Other Brace Knee Immobilizer - Right: On at all times Other Brace: R  CAM boot, L post op shoe, L RNP splint, L wrist cock up splint Restrictions Weight Bearing Restrictions Per Provider Order: Yes RUE Weight Bearing Per Provider Order: Weight bearing as tolerated LUE Weight Bearing Per Provider Order: Weight bearing as tolerated RLE Weight Bearing Per Provider Order: Weight bearing as tolerated LLE Weight Bearing Per Provider Order: Non weight bearing Other Position/Activity Restrictions: no ROM of R knee, NO ROM restrictions L knee, no shoulder ROM restrictions   Therapy/Group: Individual Therapy  Nereida Habermann, OTR/L, MSOT  02/03/2024, 6:30 AM

## 2024-02-03 NOTE — Plan of Care (Signed)
" °  Problem: Consults Goal: RH GENERAL PATIENT EDUCATION Description: See Patient Education module for education specifics. Outcome: Progressing Goal: Skin Care Protocol Initiated - if Braden Score 18 or less Description: If consults are not indicated, leave blank or document N/A Outcome: Progressing Goal: Nutrition Consult-if indicated Outcome: Progressing   Problem: RH BOWEL ELIMINATION Goal: RH STG MANAGE BOWEL WITH ASSISTANCE Description: STG Manage Bowel with mod I Assistance. Outcome: Progressing Goal: RH STG MANAGE BOWEL W/MEDICATION W/ASSISTANCE Description: STG Manage Bowel with Medication with mod I Assistance. Outcome: Progressing   Problem: RH BLADDER ELIMINATION Goal: RH STG MANAGE BLADDER WITH ASSISTANCE Description: STG Manage Bladder With min  Assistance Outcome: Progressing Goal: RH STG MANAGE BLADDER WITH MEDICATION WITH ASSISTANCE Description: STG Manage Bladder With Medication With mod I Assistance. Outcome: Progressing Goal: RH STG MANAGE BLADDER WITH EQUIPMENT WITH ASSISTANCE Description: STG Manage Bladder With Equipment With min Assistance Outcome: Progressing   Problem: RH SKIN INTEGRITY Goal: RH STG SKIN FREE OF INFECTION/BREAKDOWN Description: Manage skin without infection with min assist Outcome: Progressing   Problem: RH SAFETY Goal: RH STG ADHERE TO SAFETY PRECAUTIONS W/ASSISTANCE/DEVICE Description: STG Adhere to Safety Precautions With cues Assistance/Device. Outcome: Progressing   Problem: RH PAIN MANAGEMENT Goal: RH STG PAIN MANAGED AT OR BELOW PT'S PAIN GOAL Description: Pain < 4 with prns Outcome: Progressing   Problem: RH KNOWLEDGE DEFICIT GENERAL Goal: RH STG INCREASE KNOWLEDGE OF SELF CARE AFTER HOSPITALIZATION Description: Patient and parents will be able to manage care using educational resources for medications, skin care/ foley care and dietary modification independently Outcome: Progressing   "

## 2024-02-03 NOTE — Progress Notes (Signed)
 Bilateral lower extremity venous duplex has been completed.  Results can be found in chart review under CV Proc.  02/03/2024 7:30 PM  Clarrissa Shimkus [facility], RVT.

## 2024-02-03 NOTE — Plan of Care (Signed)
" °  Problem: Consults Goal: RH GENERAL PATIENT EDUCATION Description: See Patient Education module for education specifics. Outcome: Progressing Goal: Skin Care Protocol Initiated - if Braden Score 18 or less Description: If consults are not indicated, leave blank or document N/A Outcome: Progressing Goal: Nutrition Consult-if indicated Outcome: Progressing   Problem: RH BOWEL ELIMINATION Goal: RH STG MANAGE BOWEL WITH ASSISTANCE Description: STG Manage Bowel with mod I Assistance. Outcome: Progressing Goal: RH STG MANAGE BOWEL W/MEDICATION W/ASSISTANCE Description: STG Manage Bowel with Medication with mod I Assistance. Outcome: Progressing   Problem: RH BLADDER ELIMINATION Goal: RH STG MANAGE BLADDER WITH ASSISTANCE Description: STG Manage Bladder With min  Assistance Outcome: Progressing Goal: RH STG MANAGE BLADDER WITH MEDICATION WITH ASSISTANCE Description: STG Manage Bladder With Medication With mod I Assistance. Outcome: Progressing Goal: RH STG MANAGE BLADDER WITH EQUIPMENT WITH ASSISTANCE Description: STG Manage Bladder With Equipment With min Assistance Outcome: Progressing   Problem: RH SKIN INTEGRITY Goal: RH STG SKIN FREE OF INFECTION/BREAKDOWN Description: Manage skin without infection with min assist Outcome: Progressing   Problem: RH PAIN MANAGEMENT Goal: RH STG PAIN MANAGED AT OR BELOW PT'S PAIN GOAL Description: Pain < 4 with prns Outcome: Progressing   Problem: RH KNOWLEDGE DEFICIT GENERAL Goal: RH STG INCREASE KNOWLEDGE OF SELF CARE AFTER HOSPITALIZATION Description: Patient and parents will be able to manage care using educational resources for medications, skin care/ foley care and dietary modification independently Outcome: Progressing   "

## 2024-02-03 NOTE — Progress Notes (Signed)
 Orthopedic Tech Progress Note Patient Details:  Aaron Herrera 06-Aug-2000 [id number]  Ortho Devices Type of Ortho Device: Abdominal binder Ortho Device/Splint Location: at the sink in pt's room Ortho Device/Splint Interventions: Ordered   Post Interventions Instructions Provided: Care of device, Adjustment of device  [name] 02/03/2024, 11:49 AM

## 2024-02-03 NOTE — Progress Notes (Signed)
 "                                                        PROGRESS NOTE   Subjective/Complaints:  Very orthostatic yesterday with therapies, responded well to 500 cc IVF bolus with binder and teds.  Today BP elevated 151/86, HR 120s, T 99 max. Sat 97%.  Medium BM yesterday with suppository  UO 2 L yesteday, PO intakes only 250 ccs  Patient denies any acute complaints today.  Does admit that he has been in some pain and has not been using pain medications frequently.  Per report, had conversation with psychiatry yesterday about significant anxiety, although he did not mention this to me today  ROS: Denies fevers, chills, N/V, abdominal pain, diarrhea, SOB, cough, chest pain, new weakness or paraesthesias.   + Constipation + Bilateral lower extremity pain + Anxiety  Objective:   DG Abd 1 View Result Date: 02/02/2024 CLINICAL DATA:  Constipation. EXAM: ABDOMEN - 1 VIEW COMPARISON:  Abdomen and pelvis CT dated 01/24/2024 FINDINGS: Normal bowel-gas pattern. Small amount of stool. Partially included proximal left femur fixation hardware. Otherwise, unremarkable bones. IMPRESSION: Small amount of stool. Electronically Signed   By: Elspeth Bathe M.D.   On: 02/02/2024 15:00   Recent Labs    02/01/24 0556 02/02/24 0503  WBC 7.4 7.3  HGB 8.9* 9.2*  HCT 26.2* 27.7*  PLT 263 327   Recent Labs    02/01/24 0556 02/02/24 0503  NA 138 138  K 4.3 4.1  CL 102 102  CO2 23 20*  GLUCOSE 103* 97  BUN 20 21*  CREATININE 0.73 0.72  CALCIUM  8.8* 9.2    Intake/Output Summary (Last 24 hours) at 02/03/2024 1004 Last data filed at 02/03/2024 0830 Gross per 24 hour  Intake 60 ml  Output 2225 ml  Net -2165 ml        Physical Exam: Vital Signs Blood pressure (!) 151/86, pulse (!) 121, temperature 97.7 F (36.5 C), temperature source Oral, resp. rate 18, height 5' 11 (1.803 m), weight 104.5 kg, SpO2 97%.  PE: Constitution: Appropriate appearance for age.  Sitting upright in bed. Resp: No  respiratory distress. No accessory muscle usage. on RA and CTAB Cardio: Well perfused appearance. Trace bl peripheral edema. Abdomen: Mildly distended. Nontender.  + BS, normoactive Psych: Appropriate mood and affect. Mildly anxious.  Neuro: AAOx4. No apparent cognitive deficits    Neurologic Exam:   DTRs: Reflexes were 2+ in bilateral patella, biceps, BR and triceps. Hoffmans: negative b/l Sensory exam: revealed normal sensation in all dermatomal regions in bilateral lower extremities, right upper extremity, and with reduced sensation to light touch in L dorsal hand and wrist Motor exam: strength 5/5 throughout right upper extremity and with exception of LUE 3/5 FF, 0/5 FA, 0/5 WE, 4/5 EF, 3/5 EE, 4/5 SA BL LE wiggles all toes  Coordination: Fine motor coordination was normal.     MSK: Bl LE fractures; RLE wrapped oin soft brace   Skin: Sutures and staples intact, c/d/i   Physical exam unchanged from the above on reexamination 02/03/2024   Assessment/Plan: 1. Functional deficits which require 3+ hours per day of interdisciplinary therapy in a comprehensive inpatient rehab setting. Physiatrist is providing close team supervision and 24 hour management of active medical problems listed below. Physiatrist and rehab  team continue to assess barriers to discharge/monitor patient progress toward functional and medical goals  Care Tool:  Bathing    Body parts bathed by patient: Left arm, Chest, Abdomen, Front perineal area, Right upper leg, Face   Body parts bathed by helper: Right arm, Buttocks, Left upper leg, Left lower leg Body parts n/a: Right lower leg (immobilizer/post op shoe)   Bathing assist Assist Level: Maximal Assistance - Patient 24 - 49%     Upper Body Dressing/Undressing Upper body dressing   What is the patient wearing?: Hospital gown only    Upper body assist Assist Level: Moderate Assistance - Patient 50 - 74%    Lower Body Dressing/Undressing Lower body  dressing      What is the patient wearing?: Pants     Lower body assist Assist for lower body dressing: 2 Helpers     Toileting Toileting    Toileting assist Assist for toileting: 2 Helpers     Transfers Chair/bed transfer  Transfers assist  Chair/bed transfer activity did not occur: Safety/medical concerns (orthostatic hypotension)  Chair/bed transfer assist level: Total Assistance - Patient < 25%     Locomotion Ambulation   Ambulation assist   Ambulation activity did not occur: Safety/medical concerns          Walk 10 feet activity   Assist  Walk 10 feet activity did not occur: Safety/medical concerns        Walk 50 feet activity   Assist Walk 50 feet with 2 turns activity did not occur: Safety/medical concerns         Walk 150 feet activity   Assist Walk 150 feet activity did not occur: Safety/medical concerns         Walk 10 feet on uneven surface  activity   Assist Walk 10 feet on uneven surfaces activity did not occur: Safety/medical concerns         Wheelchair     Assist Is the patient using a wheelchair?: Yes Type of Wheelchair: Manual Wheelchair activity did not occur: Safety/medical concerns         Wheelchair 50 feet with 2 turns activity    Assist    Wheelchair 50 feet with 2 turns activity did not occur: Safety/medical concerns       Wheelchair 150 feet activity     Assist  Wheelchair 150 feet activity did not occur: Safety/medical concerns       Blood pressure (!) 151/86, pulse (!) 121, temperature 97.7 F (36.5 C), temperature source Oral, resp. rate 18, height 5' 11 (1.803 m), weight 104.5 kg, SpO2 97%.  Medical Problem List and Plan: 1. Functional deficits secondary to polytrauma             -patient may shower with surgical dressings covered             -ELOS/Goals: 12-16 days, Mod A PT/OT at [facility] level  - stable for IRF   Weight bearing: WBAT B/L upper extremities. May weight bear  on right leg for transfers in boot and knee immobilizer NWB Left leg and should wear post op shoe on left foot when transferring    Splinting: L radial nerve palsy splint (finger bands) during daytime hours PRN for exercise and functional tasks; SLEEP in black wrist cock-up splint and wear PRN during the day when not wearing other splint to prevent wrist drop.    2.  Antithrombotics: -DVT/anticoagulation:  Mechanical: Sequential compression devices, below knee Bilateral lower extremities Pharmaceutical: Lovenox              -  antiplatelet therapy: n/a             1/7: BL LE duplex ordered given BL LE fractures and uncontrolled tachycardia--pending today   3. Pain Management: Tylenol  1000 mg q6h, gabapentin  300 mg 3 times daily.  Oxycodone  10 mg q4h and  Robaxin  500 mg q6 PRN.   1/8: Reduce tylenol  to 1000 mg TID, schedule robaxin  500 mg QID, schedule oxycodone  5 mg QID and reduce PRN to 5 mg Q6H PRN   4. Mood/Behavior/Sleep: LCSW to follow for evaluation and support when available.              -anxiety: d/t poor sleep, worry, and claustrophobia; denies nightmares or re-living traumatic event. Continue hydroxyzine  at bedtime.              -sleep: melatonin 5 mg at bedtime.   - 1-8: Not using as needed Atarax , continue nightly scheduled and DC 4 times daily as needed.  If anxiety ongoing limiting factor, will discuss initiation of BuSpar .   5. Neuropsych/cognition: Psych consult ordered 1/6. Neuropsych Consult ordered in CIR.  This patient is capable of making decisions on his own behalf.   6. Skin/Wound Care: Routine pressure relief measures.               - 1 week post-op; will reach out to orthopedic surgical teams regarding timeline for suture/staple removal, RLE wrapping   -- Per Dr. Reyne, okay to remove sutures/staples. Ordered today.   7. Fluids/Electrolytes/Nutrition: Monitor strict I&O and weights. Follow up labs CBC/CMP               -Regular diet + Ensure and vitamin supplements               - Good p.o. intakes overall   8.  Splenic laceration: 2 cm, monitor hemoglobin with CBC.   9.  Possible liver laceration: Small left zone 2/3 retroperitoneal hematoma.  Same as above   10.  ABLA: S/p 4 units PRBC, 1 unit whole blood, 2 unit FFP on 12/30.  Hemoglobin 8.9 from 8.8.  PLTs improved now to 63.               - 1-7: Stable on admission labs, follow-up labs   11.  AKI: Related to hypovolemia on admission, resolved.  Encourage oral hydration. Monitor BMP               - 1/7: BUN/creatinine stable; monitor, stable at 1-7   12.  Tachycardia/hypertension: Continue Lopressor  25 mg twice daily.               - 1/7: Hypotensive with therapies + tachycardic - 500 cc IVF bolus today--responsive   - 1/8: Remains tachycardic 120s; titrate pain regimen as above; repeat orthostats and add 75 cc/hr drip if +; encourage PO fluids - may need to increase lopressor        02/02/2024    7:34 AM 02/02/2024    4:19 AM 02/02/2024   12:37 AM  Vitals with BMI  Systolic 154 151 863  Diastolic 81 92 78  Pulse 117 115 109    13.  Foley: TOV failed 1/1 & 1/5. Foley replaced on 1/6 - Continue Flomax .  1/7 Flomax  moved to at bedtime due to orthostasis.  1/8: + BM yesterday; will plan foley trial 1/10   14.  Constipation: received mag citrate 1/6 LBM unsure of success. KUB ordered.              -  Miralax  increased to BID. Colace BID, as needed enema.              1/7: Straining with orthostasis - give bowel prep  1/8: medium BM with suppository and prep - schedule miralax  17 g BID, sennakot S 1 tab BID; encourage PO fluids as above   15. Right 4 through 7, left 9 through 10 rib fractures w/pulmonary contusions: Continue pain control and pulmonary toilet.             - Encourage incentive spirometer and flutter valve. 16. L2-5 transverse process fractures - Pain control. 17. Right scapular spine fracture - Dr. Reyne consulted. This to be treated nonoperatively.  18. Right femur fracture -  s/p IM rod 12/30 Dr. Reyne. 19. Open right tib/fib fracture - s/p IM rod right tibia 12/30 Dr. Reyne, ORIF R medial malleolus, Right partial patellectomy Dr. Josefina. 20. Left humeral fracture - ORIF 12/31 Dr. Kendal. 21. Left femur fracture - s/p IM rod 12/30 Dr. Josefina. 22. Left foot fractures - s/p closed reduction left toes 12/30. 23. Left tibial plateau fracture -  washed out 12/30 Dr. Reyne, ORIF 12/31 Hr. Haddix.     LOS: 2 days A FACE TO FACE EVALUATION WAS PERFORMED  Aaron Herrera Likes 02/03/2024, 10:04 AM     "

## 2024-02-04 DIAGNOSIS — T07XXXA Unspecified multiple injuries, initial encounter: Secondary | ICD-10-CM | POA: Diagnosis not present

## 2024-02-04 MED ORDER — BUSPIRONE HCL 15 MG PO TABS
7.5000 mg | ORAL_TABLET | Freq: Two times a day (BID) | ORAL | Status: DC
  Administered 2024-02-04 – 2024-02-05 (×3): 7.5 mg via ORAL
  Filled 2024-02-04 (×4): qty 1

## 2024-02-04 MED ORDER — SENNOSIDES-DOCUSATE SODIUM 8.6-50 MG PO TABS
2.0000 | ORAL_TABLET | Freq: Two times a day (BID) | ORAL | Status: DC
  Administered 2024-02-04 – 2024-02-10 (×12): 2 via ORAL
  Filled 2024-02-04 (×12): qty 2
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
## 2024-02-04 NOTE — Plan of Care (Signed)
" °  Problem: Consults Goal: RH GENERAL PATIENT EDUCATION Description: See Patient Education module for education specifics. Outcome: Progressing Goal: Skin Care Protocol Initiated - if Braden Score 18 or less Description: If consults are not indicated, leave blank or document N/A Outcome: Progressing Goal: Nutrition Consult-if indicated Outcome: Progressing   Problem: RH BOWEL ELIMINATION Goal: RH STG MANAGE BOWEL WITH ASSISTANCE Description: STG Manage Bowel with mod I Assistance. Outcome: Progressing Goal: RH STG MANAGE BOWEL W/MEDICATION W/ASSISTANCE Description: STG Manage Bowel with Medication with mod I Assistance. Outcome: Progressing   Problem: RH BLADDER ELIMINATION Goal: RH STG MANAGE BLADDER WITH ASSISTANCE Description: STG Manage Bladder With min  Assistance Outcome: Progressing Goal: RH STG MANAGE BLADDER WITH MEDICATION WITH ASSISTANCE Description: STG Manage Bladder With Medication With mod I Assistance. Outcome: Progressing Goal: RH STG MANAGE BLADDER WITH EQUIPMENT WITH ASSISTANCE Description: STG Manage Bladder With Equipment With min Assistance Outcome: Progressing   Problem: RH SKIN INTEGRITY Goal: RH STG SKIN FREE OF INFECTION/BREAKDOWN Description: Manage skin without infection with min assist Outcome: Progressing   Problem: RH SAFETY Goal: RH STG ADHERE TO SAFETY PRECAUTIONS W/ASSISTANCE/DEVICE Description: STG Adhere to Safety Precautions With cues Assistance/Device. Outcome: Progressing   Problem: RH PAIN MANAGEMENT Goal: RH STG PAIN MANAGED AT OR BELOW PT'S PAIN GOAL Description: Pain < 4 with prns Outcome: Progressing   Problem: RH KNOWLEDGE DEFICIT GENERAL Goal: RH STG INCREASE KNOWLEDGE OF SELF CARE AFTER HOSPITALIZATION Description: Patient and parents will be able to manage care using educational resources for medications, skin care/ foley care and dietary modification independently Outcome: Progressing   "
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
## 2024-02-04 NOTE — Progress Notes (Signed)
 Patient's heart rate was noted to be high at 116, medicated with pain medication and muscle relaxant, rechecked as 101. He is currently asleep, PRNs effective. Will continue to monitor.

## 2024-02-04 NOTE — Progress Notes (Signed)
 Physical Therapy Session Note  Patient Details  Name: Aaron Herrera MRN: [id number] Date of Birth: 2000/12/26  Today's Date: 02/04/2024 PT Individual Time: 8975-8895 +1432-1527 PT Individual Time Calculation (min): 40 min + 56 min  Short Term Goals: Week 1:  PT Short Term Goal 1 (Week 1): Pt will complete bed mobility with mod assist PT Short Term Goal 2 (Week 1): Pt will complete transfers with LRAD with mod assist PT Short Term Goal 3 (Week 1): Pt will tolerate OOB 2 hours outside of therapies  Skilled Therapeutic Interventions/Progress Updates:    SESSION 1: Pt presents in room in bed, agreeable to PT. Pt reporting soreness from previous sessions yesterday. Pt agreeable to bedside session and getting up out of bed in afternoon sessions. Session focused on therapeutic activities to promote improved bed mobility as well as ROM for LLE. Pt completes rolling in bed with max assist bilaterally, improved from yesterday's session. Therapist provides max assist for doffing shorts, increased time to complete due to BLE pain and ROM restrictions. Pt then completes therapeutic exercises for BLE strengthening and ROM to assist with bed mobility including: - BLE SLR x10 active assist - BLE hip abduction x10 active assist - heel slide LLE x10 active assist Pt provided with frequent rest breaks to promote improved participation and tolerance with tasks. Pt positioned with pillows for comfort, fan in place due to pt feeling hot, all needs within reach, call light in place at end of session.   SESSION 2: Pt presents in room in bed, agreeable to PT. Pt continues to report soreness but agreeable to OOB session. Session focused on bed mobility, OOB transfer with direction of care, and upright tolerance as well as therapeutic exercise to promote BUE/core strengthening needed for transfers. Pt completes rolling max assist for placing maxi move pad. Pt then tolerates maxi move transfer total assist to WC. Pt  transported dependently to day room and positioned upright and BLEs in more dependent position to pt's tolerance to challenge cardiac output. Pt completes therapeutic exercise for BUE/core strengthening including: - bicep curls 2# bar x10 - chest press 2# bar x10 - sit ups pulling upright with BUEs on bar 2x5 - WC press ups 2x5 (therapist assist for sitting upright) Pt tolerates transfer back to bed 2 person assist. Pt completes rolling in bed max assist to remove maxi move pad. Pt positioned with pillows for comfort and for skin protection. Pt remains semi reclined with all needs within reach, call light in place at end of session.   Therapy Documentation Precautions:  Precautions Precautions: Fall Precaution/Restrictions Comments: watch BP Required Braces or Orthoses: Knee Immobilizer - Right, Other Brace Knee Immobilizer - Right: On at all times Other Brace: R CAM boot, L post op shoe, L RNP splint, L wrist cock up splint Restrictions Weight Bearing Restrictions Per Provider Order: No RUE Weight Bearing Per Provider Order: Weight bearing as tolerated LUE Weight Bearing Per Provider Order: Weight bearing as tolerated RLE Weight Bearing Per Provider Order: Weight bearing as tolerated LLE Weight Bearing Per Provider Order: Non weight bearing Other Position/Activity Restrictions: no ROM of R knee, NO ROM restrictions L knee, no shoulder ROM restrictions    Therapy/Group: Individual Therapy  Reche Ohara PT, DPT 02/04/2024, 3:36 PM

## 2024-02-04 NOTE — IPOC Note (Signed)
 Overall Plan of Care [facility]) Patient Details Name: Aaron Herrera MRN: [id number] DOB: [birth year]  Admitting Diagnosis: Critical polytrauma  Hospital Problems: Principal Problem:   Critical polytrauma     Functional Problem List: Nursing Bladder, Pain, Safety, Bowel, Endurance, Medication Management  PT Balance, Edema, Endurance, Motor, Pain, Safety, Sensory  OT Balance, Endurance, Cognition, Edema, Motor, Pain, Sensory  SLP Cognition  TR         Basic ADLs: OT Eating, Grooming, Bathing, Dressing, Toileting     Advanced  ADLs: OT None     Transfers: PT Bed Mobility, Bed to Chair, Customer Service Manager, Tub/Shower     Locomotion: PT Psychologist, [employer], Stairs, Ambulation     Additional Impairments: OT Fuctional Use of Upper Extremity  SLP Social Cognition   Problem Solving, Memory, Attention  TR      Anticipated Outcomes Item Anticipated Outcome  Self Feeding Mod I  Swallowing      Basic self-care  Min A  Toileting  Min A   Bathroom Transfers CGA  Bowel/Bladder  manage bladder w min assist and bowel with mod I assist  Transfers  supervision transfers  Locomotion  supervision WC level  Communication     Cognition  modI  Pain  Pain < 4 with prns  Safety/Judgment  manage safety w cues   Therapy Plan: PT Intensity: Minimum of 1-2 x/day ,45 to 90 minutes PT Frequency: 5 out of 7 days PT Duration Estimated Length of Stay: 2-3 weeks OT Intensity: Minimum of 1-2 x/day, 45 to 90 minutes OT Frequency: 5 out of 7 days OT Duration/Estimated Length of Stay: 3 weeks SLP Intensity: Minumum of 1-2 x/day, 30 to 90 minutes SLP Frequency: 3 to 5 out of 7 days SLP Duration/Estimated Length of Stay: 3-4 weeks   Team Interventions: Nursing Interventions Patient/Family Education, Bladder Management, Bowel Management, Medication Management, Pain Management, Skin Care/Wound Management, Disease Management/Prevention, Discharge Planning  PT interventions Community  reintegration, Neuromuscular re-education, DME/adaptive equipment instruction, Psychosocial support, UE/LE Strength taining/ROM, Wheelchair propulsion/positioning, Warden/ranger, Discharge planning, Pain management, Skin care/wound management, Therapeutic Activities, UE/LE Coordination activities, Cognitive remediation/compensation, Functional mobility training, Patient/family education, Splinting/orthotics, Therapeutic Exercise  OT Interventions Balance/vestibular training, Discharge planning, Functional electrical stimulation, Pain management, Self Care/advanced ADL retraining, Therapeutic Activities, UE/LE Coordination activities, Cognitive remediation/compensation, Functional mobility training, Patient/family education, Therapeutic Exercise, DME/adaptive equipment instruction, Neuromuscular re-education, Psychosocial support, Splinting/orthotics, UE/LE Strength taining/ROM, Wheelchair propulsion/positioning  SLP Interventions Cognitive remediation/compensation, Cueing hierarchy, Functional tasks, Therapeutic Activities, Patient/family education  TR Interventions    SW/CM Interventions Discharge Planning, Psychosocial Support, Patient/Family Education   Barriers to Discharge MD  Medical stability, Home enviroment access/loayout, Wound care, Lack of/limited family support, Insurance for SNF coverage, and Weight bearing restrictions  Nursing Decreased caregiver support, Home environment access/layout, IV antibiotics, Weight bearing restrictions 1 level 3 ste right rail w parents;can d/c to parents home which is one level with stairs to enter, they will work on a ramp, mom/dad can provide 24/7 between the two of them, mom can't lift  PT Inaccessible home environment, Home environment access/layout, Weight bearing restrictions    OT Inaccessible home environment, Decreased caregiver support, Home environment access/layout, Weight bearing restrictions, Other (comments) foley  SLP      SW  Decreased caregiver support, Lack of/limited family support, Community Education Officer for SNF coverage     Team Discharge Planning: Destination: PT-Home ,OT- Home , SLP-Home Projected Follow-up: PT-Outpatient PT, OT-  Outpatient OT, SLP-Other (comment) (TBD) Projected Equipment Needs: PT-Wheelchair cushion (measurements), OT-  To be determined, SLP-None recommended by SLP Equipment Details: PT-standard WC 20 with elevating leg rests, OT-  Patient/family involved in discharge planning: PT- Patient, Family member/caregiver,  OT-Patient, Family member/caregiver, SLP-Patient  MD ELOS: 12-16 days Medical Rehab Prognosis:  Excellent Assessment: The patient has been admitted for CIR therapies with the diagnosis of polytrauma. The team will be addressing functional mobility, strength, stamina, balance, safety, adaptive techniques and equipment, self-care, bowel and bladder mgt, patient and caregiver education,  . Goals have been set at Mod A PT, OT. Anticipated discharge destination is home.       See Team Conference Notes for weekly updates to the plan of care

## 2024-02-04 NOTE — Discharge Instructions (Addendum)
 Inpatient Rehab Discharge Instructions  Aaron Herrera  Discharge date and time: 02/25/2024  Activities/Precautions/ Functional Status:  Activity: activity as tolerated and no lifting, driving, or strenuous exercise for until cleared by provider.   Diet: regular diet  Wound Care: keep wound clean and dry   Functional status:  ___ No restrictions     ___ Walk up steps independently __X_ 24/7 supervision/assistance   __X_ Walk up steps with assistance ___ Intermittent supervision/assistance  ___ Bathe/dress independently ___ Walk with walker     __X_ Bathe/dress with assistance ___ Walk Independently    ___ Shower independently _X__ Walk with assistance    __X_ Shower with assistance __X_ No alcohol     ___ Return to work/school ________  Special Instructions:  Continue Weight Bearing precautions:  No weight bearing in right lower extremity.   My questions have been answered and I understand these instructions. I will adhere to these goals and the provided educational materials after my discharge from the hospital.  Patient/Caregiver Signature _______________________________ Date __________  Clinician Signature _______________________________________ Date __________  Please bring this form and your medication list with you to all your follow-up doctor's appointments.     COMMUNITY REFERRALS UPON DISCHARGE:    THERAPY IS CURRENTLY DEFERRED UNTIL WEIGHT BEARING STATUS CHANGES. BE SURE TO FOLLOW-UP WITH ORTHOPEDIC ABOUT A REFERRAL FOR OUTPATIENT THRPAY WHEN APPROPRIATE.   Medical Equipment/Items Ordered:                                                  Agency/Supplier:   GENERAL COMMUNITY RESOURCES FOR PATIENT/FAMILY:  1) A REFERRAL WAS SUBMITTED ON YOUR BEHALF TO YOUR INSURANCE- [place] DEPT FOR PCS (PERSONAL CARE SERVICES) FOR AIDE CARE IN THE HOME.  BE SURE TO CALL TO CHECK THE STATUS OF THIS REFERRAL AND SCHEDULE AN APPOINTMENT FOR THE NURSE TO COMPLETE A HOME EVALUATION.  CALL #[phone].    Psychotherapy: What to Expect  If youve never done therapy before, youre not alone--and youre not expected to know how it all works right away. The word therapy can mean many different things. There are lots of types of psychotherapy out there: some are short-term and focused on specific goals, while others are more open-ended and longer-term.  What matters most is that you feel heard and supported. A good therapist will take the time to understand your concerns, help you set clear goals, and work with you to develop a treatment plan that fits your needs--regardless of the type of therapy they practice.  Think of finding a therapist like finding the right pair of shoes. Not every pair fits the same person or situation. A good therapist should help you feel supported, safe, and ready to grow. And sometimes, just like with new shoes, it takes a few sessions to feel comfortable. But if it still doesnt feel right after a few visits, its perfectly okay to try someone else. You havent done anything wrong--this is about finding the right fit for you.  Dont give up if the first match isnt ideal. Therapy works best when you feel comfortable enough to speak openly and work through [employer] bothering you.  The term therapy can mean many things. There are many different types of psychotherapy, some are short-term, others take a longer period of time. A good therapist will meet with you, discuss your goals, and  help you set a treatment plan that addresses your major concerns, regardless of what type of therapy they practice. If the first therapist is not a great fit, try another one.   The American Psychological Association (the APA) has resources to learn about different kinds of therapy.  Here is a link to learn more about the different kinds of psychotherapy:   gym[place].si _ Outpatient Therapy and Psychiatry Resources for  Patients: Your psychiatric needs would be well-served by consultation and regular meetings with an outpatient therapist to assist you with your mood-related conditions. Here are a series of links for finding a therapist.    Includes links to the following: [facility] (http://wilson-mayo.com/) (only for [facility] and please reserve for uninsured) [facility] (http://blankenship-martinez.net/) Psychology Today Special Educational Needs Teacher (https://www.psychologytoday.com/us lendell) Psychology Today Support Group Tax Inspector (https://www.psychologytoday.com/us /groups/) [employer] - [employer] Location (https://carolinabehavioralcare.com/staff-location/[place]/) [facility] - Support Group Finder - (recorddebt.fi) [facility] [employer] - Lexicographer (https://fspcares.org/contact/) [employer] for Mental [facility] [place] - NAMI (https://namiguilford.org/support-and-education/support-groups/) [employer] [facility] - Affiliated with [facility] (https://www.[place].com/lb/locations/profile/[facility]-behavioral-medicine-at-[address]/) Dept of Health and Human Services - Find a mental health facility (http://lester.info/) __ As we discussed in the room, I do not think you are likely to progress towards full posttraumatic stress disorder, however if you begin to notice worsening symptoms (worsening flashbacks, nightmares, hypervigilance), I would recommend coming in to see a psychiatrist for help with medication and finding a psychotherapist.  Specifically here some things you can learn about if you notice that your symptoms are worsening:  Psychotherapy for PTSD Post-traumatic stress disorder (PTSD)  can be effectively treated with several types of psychotherapy. Here are the most effective therapies, ranked by the strength of the evidence supporting them: Eye Movement Desensitization and Reprocessing (EMDR): EMDR has shown strong evidence for reducing PTSD symptoms both immediately after treatment and in the long term. It involves recalling traumatic memories while making specific eye movements, which helps process and reduce the distress associated with these memories. Cognitive Processing Therapy (CPT): CPT is a type of cognitive-behavioral therapy (CBT) that focuses on changing negative thoughts and beliefs related to the trauma. It has been shown to be highly effective in reducing PTSD symptoms and maintaining these improvements over time. Prolonged Exposure (PE): PE is another form of CBT that involves repeatedly discussing the traumatic event in a safe environment to reduce the power of the trauma over time. It has strong evidence supporting its effectiveness in reducing PTSD symptoms. Trauma-Focused Cognitive Behavioral Therapy (TF-CBT): This therapy combines elements of cognitive therapy and exposure therapy to help patients process and reduce the impact of traumatic memories. It is particularly effective in both short-term and long-term symptom reduction. Narrative Exposure Therapy (NET): NET involves creating a detailed narrative of the traumatic event and has been shown to be effective in reducing PTSD symptoms, especially in populations with multiple traumas. Present-Centered Therapy (PCT): PCT focuses on current life problems and stressors rather than directly addressing the trauma. It has moderate evidence supporting its effectiveness in reducing PTSD symptoms.  Each of these therapies has been shown to be effective in treating PTSD, but the best choice depends on individual patient needs and preferences. Discuss these options with your healthcare provider to determine the most suitable  therapy for you.

## 2024-02-04 NOTE — Progress Notes (Signed)
 Speech Language Pathology Daily Session Note  Patient Details  Name: TEREN ZURCHER MRN: [id number] Date of Birth: 04/09/2000  Today's Date: 02/04/2024 SLP Individual Time: 0804-0900 SLP Individual Time Calculation (min): 56 min  Short Term Goals: Week 1: SLP Short Term Goal 1 (Week 1): Pt will recall detailed information w/ 90% accuracy given supervisionA SLP Short Term Goal 2 (Week 1): Pt will process complex information w/ supervisionA SLP Short Term Goal 3 (Week 1): Pt will complex cognitive tasks targeting selective attention or greater w/ supervisionA SLP Short Term Goal 4 (Week 1): Pt will solve complex problems w/ supervisionA  Skilled Therapeutic Interventions:   Pt greeted at bedside for tx targeting cognition. He reported reduced sleep last night as compared to previous night. After initial conversation re pt status, he was challenged to a structured recall task w/ 5 words. Immediately, he was able to recall 5/5 words. After ~8 min delay, he recalled 3/5 words, increasing to 5/5 w/ minA. After an additional ~15 min delay, he recalled 4/5 words, increasing to 5/5 w/ supervisionA. Between recall times, he completed complex information processing and organization tasks. He completed tasks x3 re organizing raw data @ modI, demonstrating adequate attention to detail and processing time. He rated today's tasks a consistent 2/10 difficulty, which demonstrates improved endurance as compared to prev day! At the end of tx tasks, he was left in bed w/ the alarm set and call light within reach. Recommend cont ST per POC.   Pain Pain Assessment Pain Scale: 0-10 Pain Score: 4  Pain Location: Generalized Pain Intervention(s): Medication (See eMAR)  Therapy/Group: Individual Therapy  [name] 02/04/2024, 8:40 AM

## 2024-02-05 DIAGNOSIS — K5901 Slow transit constipation: Secondary | ICD-10-CM

## 2024-02-05 DIAGNOSIS — R000 Tachycardia, unspecified: Secondary | ICD-10-CM

## 2024-02-05 DIAGNOSIS — T07XXXA Unspecified multiple injuries, initial encounter: Secondary | ICD-10-CM | POA: Diagnosis not present

## 2024-02-05 DIAGNOSIS — R52 Pain, unspecified: Secondary | ICD-10-CM | POA: Diagnosis not present

## 2024-02-05 DIAGNOSIS — F418 Other specified anxiety disorders: Secondary | ICD-10-CM | POA: Diagnosis not present

## 2024-02-05 MED ORDER — METOPROLOL TARTRATE 50 MG PO TABS: 50.0000 mg | ORAL_TABLET | Freq: Two times a day (BID) | ORAL | Status: DC

## 2024-02-05 MED ORDER — METOPROLOL TARTRATE 50 MG PO TABS
50.0000 mg | ORAL_TABLET | Freq: Two times a day (BID) | ORAL | Status: DC
  Administered 2024-02-05 – 2024-02-17 (×25): 50 mg via ORAL
  Filled 2024-02-05 (×25): qty 1

## 2024-02-05 NOTE — Plan of Care (Signed)
" °  Problem: Consults Goal: RH GENERAL PATIENT EDUCATION Description: See Patient Education module for education specifics. Outcome: Progressing   Problem: RH BOWEL ELIMINATION Goal: RH STG MANAGE BOWEL WITH ASSISTANCE Description: STG Manage Bowel with mod I Assistance. Outcome: Progressing   "

## 2024-02-05 NOTE — Progress Notes (Signed)
 Speech Language Pathology Daily Session Note  Patient Details  Name: Aaron Herrera MRN: [id number] Date of Birth: 03-25-2000  Today's Date: 02/05/2024 SLP Individual Time: 0806-0900 SLP Individual Time Calculation (min): 54 min  Short Term Goals: Week 1: SLP Short Term Goal 1 (Week 1): Pt will recall detailed information w/ 90% accuracy given supervisionA SLP Short Term Goal 2 (Week 1): Pt will process complex information w/ supervisionA SLP Short Term Goal 3 (Week 1): Pt will complex cognitive tasks targeting selective attention or greater w/ supervisionA SLP Short Term Goal 4 (Week 1): Pt will solve complex problems w/ supervisionA  Skilled Therapeutic Interventions:   Pt was greeted at bedside for tx targeting cognition.  He was challenged to a 5 word recall task and immediately recalled 5/5 words. After ~12 min delay, he recalled 2/5 words, increasing to 5/5 w/ modA. He did independently utilize Association (WRAP strategy) to recall those 2 words. SLP assisted to ID examples of Association for 4/5 items. After an additional ~5 min delay, he was able to independently recall 5/5 words using Association. After and additional ~20 min delay, 5/5 words were independently recalled again. Between recall times, he completed complex organization and information processing task initiated in prev tx session @ modI. He also completed a deductive reasoning task @ modI, appropriately self correcting minor mistakes x2. He reported no headache or strain at the end of tx tasks and presented w/ reduced processing time as compared to prev tx sessions. Memory appears to remain his biggest barrier to return to PLOF, though will continue to target executive functioning in tasks of increased complexity. At the end of tx tasks, he was left in bed w/ the call light within reach. Recommend cont ST per POC.   Pain Pain Assessment Pain Scale: 0-10 Pain Score: 7  Pain Location: Generalized Pain Intervention(s):  Medication (See eMAR)  Therapy/Group: Individual Therapy  [name] 02/05/2024, 8:34 AM

## 2024-02-05 NOTE — Progress Notes (Signed)
 Occupational Therapy Session Note  Patient Details  Name: Aaron Herrera MRN: [id number] Date of Birth: 11/27/2000  Today's Date: 02/05/2024 OT Individual Time: 1300-1415 OT Individual Time Calculation (min): 75 min    Short Term Goals: Week 1:  OT Short Term Goal 1 (Week 1): Pt will donn UB shirt with CGA for balance OT Short Term Goal 2 (Week 1): Pt will complete toilet transfer with max A OT Short Term Goal 3 (Week 1): Pt will donn LUE radial nerve palsy splint on LUE with set up A  Skilled Therapeutic Interventions/Progress Updates:    Pt received in bed with his friends present. Pt agreeable to therapy.  Pt participated extremely well tolerating what he could with all mobility and exercises.  - with max A to manage legs and +2 support from rehab tech to give guiding A to upper torso, pt came to EOB and LE place on standard wc seat as a foot rest as pt is not able to bend R knee or tolerate much motion in L. HR 99 at rest in supine and 113 in sitting.  BP 127/76 supine and 137/75 sitting. -pt tolerance sitting for about 7-8 minutes and during that time had him work on gentle reaching with arms forward towards his feet and gentle trunk rotation -total A back to supine with HOB elevated  -LLE PROM to 20 degrees of knee flexion.  Placed sheet in a loop around foot and pt able to hold ends of sheet in his hands to do self ROM to his knee himself actually gaining more ROM closer to 30 degrees of knee flexion. Pt tolerated this well and it seemed to be easier to him to have more control over the movement. With the sheet, he also worked on hip abd/add and stretch of dorsiflexion with active plantar flexion against the sheet  -BUE AROM with active shoulder reaching over head -active prayer stretch using large foam block between hands to help maintain more elongation of fingers -placed radial nerve palsy splint on and had pt work on finger hook curls against resistance with blue foam block and  active finger extension to only a few degrees. To help him get more of a passive extension of fingers, gave pt firm foam cylinder to hook fingers around to squeeze and then he could roll it up to extend his fingers (like a foam roller).  Encouraged pt to keep the splint on until 3pm to keep working on those exercises and to do the sh AROM ex tomorrow.   Pt resting in bed with all needs met.   Therapy Documentation Precautions:  Precautions Precautions: Fall Precaution/Restrictions Comments: watch BP Required Braces or Orthoses: Knee Immobilizer - Right, Other Brace Knee Immobilizer - Right: On at all times Other Brace: R CAM boot, L post op shoe, L RNP splint, L wrist cock up splint Restrictions Weight Bearing Restrictions Per Provider Order: Yes RUE Weight Bearing Per Provider Order: Weight bearing as tolerated LUE Weight Bearing Per Provider Order: Weight bearing as tolerated RLE Weight Bearing Per Provider Order: Weight bearing as tolerated LLE Weight Bearing Per Provider Order: Non weight bearing Other Position/Activity Restrictions: no ROM of R knee, NO ROM restrictions L knee, no shoulder ROM restrictions    Vital Signs: Therapy Vitals Temp: 98 F (36.7 C) Temp Source: Oral Pulse Rate: 98 Resp: 17 BP: 122/78 Patient Position (if appropriate): Lying Oxygen Therapy SpO2: 98 % O2 Device: Room Air Pain: Pain Assessment Pain Scale: 0-10 Pain Score: 6  Pain Location: Other (Comment) Pain Intervention(s): Medication (See eMAR) ADL: ADL Eating: Minimal assistance Where Assessed-Eating: Bed level Grooming: Minimal assistance Where Assessed-Grooming: Bed level Upper Body Bathing: Moderate assistance Where Assessed-Upper Body Bathing: Bed level Lower Body Bathing: Maximal assistance Where Assessed-Lower Body Bathing: Bed level Upper Body Dressing: Moderate assistance Where Assessed-Upper Body Dressing: Bed level Lower Body Dressing: Dependent (+2) Where Assessed-Lower  Body Dressing: Bed level Toileting: Dependent Where Assessed-Toileting: Bed level Toilet Transfer: Unable to assess Toilet Transfer Method: Unable to assess Tub/Shower Transfer: Unable to assess Tub/Shower Transfer Method: Unable to assess Psychologist, Counselling Transfer: Unable to assess Film/video Editor Method: Unable to assess   Therapy/Group: Individual Therapy  Aaron Herrera 02/05/2024, 3:25 PM
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
## 2024-02-05 NOTE — Progress Notes (Signed)
 Physical Therapy Session Note  Patient Details  Name: Aaron Herrera MRN: [id number] Date of Birth: [birth year]  Today's Date: 02/05/2024 PT Individual Time: 8994-8884 PT Individual Time Calculation (min): 70 min   Short Term Goals: Week 1:  PT Short Term Goal 1 (Week 1): Pt will complete bed mobility with mod assist PT Short Term Goal 2 (Week 1): Pt will complete transfers with LRAD with mod assist PT Short Term Goal 3 (Week 1): Pt will tolerate OOB 2 hours outside of therapies  Skilled Therapeutic Interventions/Progress Updates:    Pt presents in room in bed, agreeable to PT. Pt denies pain at rest, reports pain with BLE movement during session. Session somewhat limited due to tachycardia at rest throughout session, MD notified and recommends to continue activity and cease if HR exceeds 150. Therapist dons shorts max assist with pt in supine. Pt completes rolling in bed for bringing shorts over hips and placing maxi move pad. Pt tolerates total assist for maxi move transfer to TIS WC. Pt transported to day room and positioned in upright, remains upright for 30 minutes. Pt vitals assessed at rest seated upright, HR 125-130, BP 130/90. Pt then completes therapeutic exercise for BUE/core strengthening including: - bicep curls 3# bar x10 - chest press 3# bar x10 (tilted posteriorly in chair) - overhead press 3# bar x10 - front raise 3# bar x10 - sit ups x10 - sit ups holding 2# med ball x10 Pt provided with seated rest breaks between all exercises to promote energy conservation and quality with tasks. Pt requesting to return to bed at end of session, tolerates maxi move transfer with assist x2 back to bed. Pt positioned with pillows for comfort and skin protection, all needs within reach, call light in place, at end of session.    Therapy Documentation Precautions:  Precautions Precautions: Fall Precaution/Restrictions Comments: watch BP Required Braces or Orthoses: Knee Immobilizer -  Right, Other Brace Knee Immobilizer - Right: On at all times Other Brace: R CAM boot, L post op shoe, L RNP splint, L wrist cock up splint Restrictions Weight Bearing Restrictions Per Provider Order: Yes RUE Weight Bearing Per Provider Order: Weight bearing as tolerated LUE Weight Bearing Per Provider Order: Weight bearing as tolerated RLE Weight Bearing Per Provider Order: Weight bearing as tolerated LLE Weight Bearing Per Provider Order: Non weight bearing Other Position/Activity Restrictions: no ROM of R knee, NO ROM restrictions L knee, no shoulder ROM restrictions   Therapy/Group: Individual Therapy  Reche Ohara PT, DPT 02/05/2024, 12:08 PM

## 2024-02-06 DIAGNOSIS — R52 Pain, unspecified: Secondary | ICD-10-CM | POA: Diagnosis not present

## 2024-02-06 DIAGNOSIS — R000 Tachycardia, unspecified: Secondary | ICD-10-CM | POA: Diagnosis not present

## 2024-02-06 DIAGNOSIS — T07XXXA Unspecified multiple injuries, initial encounter: Secondary | ICD-10-CM | POA: Diagnosis not present

## 2024-02-06 DIAGNOSIS — F418 Other specified anxiety disorders: Secondary | ICD-10-CM

## 2024-02-06 DIAGNOSIS — K5901 Slow transit constipation: Secondary | ICD-10-CM | POA: Diagnosis not present

## 2024-02-06 MED ORDER — BUSPIRONE HCL 10 MG PO TABS
10.0000 mg | ORAL_TABLET | Freq: Two times a day (BID) | ORAL | Status: DC
  Administered 2024-02-06 – 2024-02-25 (×39): 10 mg via ORAL
  Filled 2024-02-06 (×39): qty 1

## 2024-02-06 MED ORDER — OXYCODONE HCL ER 10 MG PO T12A
10.0000 mg | EXTENDED_RELEASE_TABLET | Freq: Two times a day (BID) | ORAL | Status: AC
  Administered 2024-02-06 – 2024-02-16 (×22): 10 mg via ORAL
  Filled 2024-02-06 (×22): qty 1

## 2024-02-06 MED ORDER — CHLORHEXIDINE GLUCONATE CLOTH 2 % EX PADS
6.0000 | MEDICATED_PAD | Freq: Two times a day (BID) | CUTANEOUS | Status: DC
  Administered 2024-02-06 – 2024-02-07 (×3): 6 via TOPICAL

## 2024-02-06 NOTE — Progress Notes (Signed)
" °   02/05/24 0913  Assess: MEWS Score  Temp 97.7 F (36.5 C)  BP (!) 145/85  MAP (mmHg) 102  Pulse Rate (!) 117  Resp 18  SpO2 96 %  O2 Device Room Air  Assess: MEWS Score  MEWS Temp 0  MEWS Systolic 0  MEWS Pulse 2  MEWS RR 0  MEWS LOC 0  MEWS Score 2  MEWS Score Color Yellow  Assess: if the MEWS score is Yellow or Red  Were vital signs accurate and taken at a resting state? Yes  Does the patient meet 2 or more of the SIRS criteria? No  Does the patient have a confirmed or suspected source of infection? No  MEWS guidelines implemented  No, previously yellow, continue vital signs every 4 hours  Notify: Charge Nurse/RN  Name of Charge Nurse/RN Notified Multimedia Programmer  Assess: SIRS CRITERIA  SIRS Temperature  0  SIRS Respirations  0  SIRS Pulse 1  SIRS WBC 0  SIRS Score Sum  1    "

## 2024-02-06 NOTE — Progress Notes (Signed)
" °   02/06/24 0756  Assess: MEWS Score  Temp 98.1 F (36.7 C)  BP (!) 143/80  Pulse Rate (!) 105  Resp 18  SpO2 96 %  Assess: MEWS Score  MEWS Temp 0  MEWS Systolic 0  MEWS Pulse 1  MEWS RR 0  MEWS LOC 0  MEWS Score 1  MEWS Score Color Green  Assess: if the MEWS score is Yellow or Red  Were vital signs accurate and taken at a resting state? Yes  Does the patient meet 2 or more of the SIRS criteria? No  Does the patient have a confirmed or suspected source of infection? No  MEWS guidelines implemented  No, previously yellow, continue vital signs every 4 hours  Notify: Charge Nurse/RN  Name of Charge Nurse/RN Notified Multimedia Programmer  Assess: SIRS CRITERIA  SIRS Temperature  0  SIRS Respirations  0  SIRS Pulse 1  SIRS WBC 0  SIRS Score Sum  1    "

## 2024-02-06 NOTE — Progress Notes (Signed)
 "                                                        PROGRESS NOTE   Subjective/Complaints:  Anxiety a little better with buspar . Still waking up at 0400 d/t pain.   ROS: Patient denies fever, rash, sore throat, blurred vision, dizziness, nausea, vomiting, diarrhea, cough, shortness of breath or chest pain,   headache, or mood change.   Objective:   No results found.  No results for input(s): WBC, HGB, HCT, PLT in the last 72 hours.  No results for input(s): NA, K, CL, CO2, GLUCOSE, BUN, CREATININE, CALCIUM  in the last 72 hours.   Intake/Output Summary (Last 24 hours) at 02/06/2024 0821 Last data filed at 02/05/2024 2044 Gross per 24 hour  Intake 236 ml  Output 800 ml  Net -564 ml        Physical Exam: Vital Signs Blood pressure (!) 143/80, pulse (!) 105, temperature 98.1 F (36.7 C), temperature source Oral, resp. rate 18, height 5' 11 (1.803 m), weight 104.5 kg, SpO2 96%.  Constitutional: No distress . Vital signs reviewed. HEENT: NCAT, EOMI, oral membranes moist Neck: supple Cardiovascular: RRR without murmur. No JVD    Respiratory/Chest: CTA Bilaterally without wheezes or rales. Normal effort    GI/Abdomen: BS +, non-tender, non-distended Ext: no clubbing, cyanosis, or edema Psych: pleasant, a little flat but cooperative  Neurologic Exam:   Sensory exam: revealed normal sensation in all dermatomal regions in bilateral lower extremities, right upper extremity, and with reduced sensation to light touch in L dorsal thumb  Motor exam: strength 5/5 throughout right upper extremity and with exception of LUE 3/5 FF, 0/5 FA, 0/5 WE, 4/5 EF, 3/5 EE, 4/5 SA--stable in appearance 1/10  BL LE wiggles all toes, strength antigravity, testing limited by bracing and weightbearing precautions. Coordination: Fine motor coordination was normal where it could be tested.     MSK: Bl LE fractures; RLE wrapped in soft brace   Skin: short staple line remains  on right proximal thigh, dressing in place distally. other Incisions are intact.     Assessment/Plan: 1. Functional deficits which require 3+ hours per day of interdisciplinary therapy in a comprehensive inpatient rehab setting. Physiatrist is providing close team supervision and 24 hour management of active medical problems listed below. Physiatrist and rehab team continue to assess barriers to discharge/monitor patient progress toward functional and medical goals  Care Tool:  Bathing    Body parts bathed by patient: Left arm, Chest, Abdomen, Front perineal area, Right upper leg, Left upper leg   Body parts bathed by helper: Right arm, Buttocks, Left lower leg Body parts n/a: Right lower leg   Bathing assist Assist Level: Moderate Assistance - Patient 50 - 74%     Upper Body Dressing/Undressing Upper body dressing   What is the patient wearing?: Pull over shirt    Upper body assist Assist Level: Minimal Assistance - Patient > 75%    Lower Body Dressing/Undressing Lower body dressing      What is the patient wearing?: Pants     Lower body assist Assist for lower body dressing: Total Assistance - Patient < 25%     Toileting Toileting    Toileting assist Assist for toileting: 2 Helpers     Transfers Chair/bed transfer  Transfers  assist  Chair/bed transfer activity did not occur: Safety/medical concerns (orthostatic hypotension)  Chair/bed transfer assist level: Total Assistance - Patient < 25%     Locomotion Ambulation   Ambulation assist   Ambulation activity did not occur: Safety/medical concerns          Walk 10 feet activity   Assist  Walk 10 feet activity did not occur: Safety/medical concerns        Walk 50 feet activity   Assist Walk 50 feet with 2 turns activity did not occur: Safety/medical concerns         Walk 150 feet activity   Assist Walk 150 feet activity did not occur: Safety/medical concerns         Walk 10 feet  on uneven surface  activity   Assist Walk 10 feet on uneven surfaces activity did not occur: Safety/medical concerns         Wheelchair     Assist Is the patient using a wheelchair?: Yes Type of Wheelchair: Manual Wheelchair activity did not occur: Safety/medical concerns         Wheelchair 50 feet with 2 turns activity    Assist    Wheelchair 50 feet with 2 turns activity did not occur: Safety/medical concerns       Wheelchair 150 feet activity     Assist  Wheelchair 150 feet activity did not occur: Safety/medical concerns       Blood pressure (!) 143/80, pulse (!) 105, temperature 98.1 F (36.7 C), temperature source Oral, resp. rate 18, height 5' 11 (1.803 m), weight 104.5 kg, SpO2 96%.  Medical Problem List and Plan: 1. Functional deficits secondary to polytrauma             -patient may shower with surgical dressings covered             -ELOS/Goals: 12-16 days, Mod A PT/OT at [facility] level -Continue CIR therapies including PT, OT,SLP    Weight bearing: WBAT B/L upper extremities. May weight bear on right leg for transfers in boot and knee immobilizer NWB Left leg and should wear post op shoe on left foot when transferring    Splinting: L radial nerve palsy splint (finger bands) during daytime hours PRN for exercise and functional tasks; SLEEP in black wrist cock-up splint and wear PRN during the day when not wearing other splint to prevent wrist drop.    2.  Antithrombotics: -DVT/anticoagulation:  Mechanical: Sequential compression devices, below knee Bilateral lower extremities Pharmaceutical: Lovenox              -antiplatelet therapy: n/a             -BL LE duplex dopplers negative   3. Pain Management: Tylenol  1000 mg q6h, gabapentin  300 mg 3 times daily.  Oxycodone  10 mg q4h and  Robaxin  500 mg q6 PRN.   1/8: Reduce tylenol  to 1000 mg TID, schedule robaxin  500 mg QID, schedule oxycodone  5 mg QID and reduce PRN to 5 mg Q6H PRN  1/11 still chasing  pain at times. Pain wakes him up overnight   -will change scheduled oxycodone  5 q6 to oxycontin , titrate that if needed.  4. Mood/Behavior/Sleep: LCSW to follow for evaluation and support when available.              -anxiety: d/t poor sleep, worry, and claustrophobia; denies nightmares or re-living traumatic event. Continue hydroxyzine  at bedtime.              -sleep: melatonin 5  mg at bedtime.   - 1-8: Not using as needed Atarax , continue nightly scheduled and DC 4 times daily as needed.  If anxiety ongoing limiting factor, will discuss initiation of BuSpar .  1/9-10: Started BuSpar  7.5 mg twice daily.  Encouraged use of as needed Atarax  at night if needed--monitor this weekend.  1/11- some improvement but will increase to 10mg  bid as anxiety still a factor   5. Neuropsych/cognition: Psych consult ordered 1/6. Neuropsych Consult ordered in CIR.  This patient is capable of making decisions on his own behalf.   6. Skin/Wound Care: Routine pressure relief measures.               - 1 week post-op; will reach out to orthopedic surgical teams regarding timeline for suture/staple removal, RLE wrapping   -- Per Dr. Reyne, okay to remove sutures/staples. Ordered 1-8 - 1/9: Per Lauraine Moores, left humerus and right tibial incisions can be left open to air.  Per Dr. Josefina, right ankle dressing to remain intact unless saturated.   1/11 I reiterated this plan to pt/father this morning  7. Fluids/Electrolytes/Nutrition: Monitor strict I&O and weights. Follow up labs CBC/CMP               -Regular diet + Ensure and vitamin supplements              - Good p.o. intakes overall   8.  Splenic laceration: 2 cm, monitor hemoglobin with CBC.   9.  Possible liver laceration: Small left zone 2/3 retroperitoneal hematoma.  Same as above   10.  ABLA: S/p 4 units PRBC, 1 unit whole blood, 2 unit FFP on 12/30.  Hemoglobin 8.9 from 8.8.  PLTs improved now to 63.               - 1-7: Stable on admission labs,  follow-up labs   11.  AKI: Related to hypovolemia on admission, resolved.  Encourage oral hydration. Monitor BMP               - 1/7: BUN/creatinine stable; monitor, stable at 1-7   12.  Tachycardia/hypertension: Continue Lopressor  25 mg twice daily.               - 1/7: Hypotensive with therapies + tachycardic - 500 cc IVF bolus today--responsive   - 1/8: Remains tachycardic 120s; titrate pain regimen as above; repeat orthostats and add 75 cc/hr drip if +; encourage PO fluids - may need to increase lopressor   1/11 HR a little better this morning (105) -continue lopressor  at 50mg  bid to see if this better controls without dropping bp too much -rx pain and anxiety as above       02/02/2024    7:34 AM 02/02/2024    4:19 AM 02/02/2024   12:37 AM  Vitals with BMI  Systolic 154 151 863  Diastolic 81 92 78  Pulse 117 115 109    13.  Foley: TOV failed 1/1 & 1/5. Foley replaced on 1/6 - Continue Flomax .  1/7 Flomax  moved to at bedtime due to orthostasis.  1/8: + BM yesterday; will plan foley trial Monday 1/12  14.  Constipation: received mag citrate 1/6 LBM unsure of success. KUB ordered.              -Miralax  increased to BID. Colace BID, as needed enema.              1/7: Straining with orthostasis - give bowel prep  1/8: medium BM with  suppository and prep - schedule miralax  17 g BID, sennakot S 1 tab BID; encourage PO fluids as above  1-9: Increase Senokot to 2 tabs twice daily   -last BM 1/10 15. Right 4 through 7, left 9 through 10 rib fractures w/pulmonary contusions: Continue pain control and pulmonary toilet.             - Encourage incentive spirometer and flutter valve. 16. L2-5 transverse process fractures - Pain control. 17. Right scapular spine fracture - Dr. Reyne consulted. This to be treated nonoperatively.  18. Right femur fracture - s/p IM rod 12/30 Dr. Reyne. 19. Open right tib/fib fracture - s/p IM rod right tibia 12/30 Dr. Reyne, ORIF R medial malleolus, Right  partial patellectomy Dr. Josefina. 20. Left humeral fracture - ORIF 12/31 Dr. Kendal. 21. Left femur fracture - s/p IM rod 12/30 Dr. Josefina. 22. Left foot fractures - s/p closed reduction left toes 12/30. 23. Left tibial plateau fracture -  washed out 12/30 Dr. Reyne, ORIF 12/31 Hr. Haddix.  24. Obesity. Body mass index is 32.13 kg/m. Complicates all aspects of care.   LOS: 5 days A FACE TO FACE EVALUATION WAS PERFORMED  Aaron Herrera 02/06/2024, 8:21 AM     "

## 2024-02-07 DIAGNOSIS — T07XXXA Unspecified multiple injuries, initial encounter: Secondary | ICD-10-CM | POA: Diagnosis not present

## 2024-02-07 LAB — CBC
HCT: 29.2 % — ABNORMAL LOW (ref 39.0–52.0)
Hemoglobin: 9.5 g/dL — ABNORMAL LOW (ref 13.0–17.0)
MCH: 30.4 pg (ref 26.0–34.0)
MCHC: 32.5 g/dL (ref 30.0–36.0)
MCV: 93.6 fL (ref 80.0–100.0)
Platelets: 493 K/uL — ABNORMAL HIGH (ref 150–400)
RBC: 3.12 MIL/uL — ABNORMAL LOW (ref 4.22–5.81)
RDW: 16.5 % — ABNORMAL HIGH (ref 11.5–15.5)
WBC: 6.5 K/uL (ref 4.0–10.5)
nRBC: 0 % (ref 0.0–0.2)

## 2024-02-07 LAB — BASIC METABOLIC PANEL WITH GFR
Anion gap: 13 (ref 5–15)
BUN: 21 mg/dL — ABNORMAL HIGH (ref 6–20)
CO2: 23 mmol/L (ref 22–32)
Calcium: 9.5 mg/dL (ref 8.9–10.3)
Chloride: 103 mmol/L (ref 98–111)
Creatinine, Ser: 0.83 mg/dL (ref 0.61–1.24)
GFR, Estimated: >60 mL/min
Glucose, Bld: 93 mg/dL (ref 70–99)
Potassium: 4.2 mmol/L (ref 3.5–5.1)
Sodium: 139 mmol/L (ref 135–145)

## 2024-02-07 MED ORDER — LIDOCAINE HCL URETHRAL/MUCOSAL 2 % EX GEL: 1.0000 | CUTANEOUS | Status: DC | PRN

## 2024-02-07 NOTE — Progress Notes (Signed)
 Speech Language Pathology Daily Session Note  Patient Details  Name: Aaron Herrera MRN: [id number] Date of Birth: Jan 11, 2001  Today's Date: 02/07/2024 SLP Individual Time: 9169-9084 SLP Individual Time Calculation (min): 45 min  Short Term Goals: Week 1: SLP Short Term Goal 1 (Week 1): Pt will recall detailed information w/ 90% accuracy given supervisionA SLP Short Term Goal 2 (Week 1): Pt will process complex information w/ supervisionA SLP Short Term Goal 3 (Week 1): Pt will complex cognitive tasks targeting selective attention or greater w/ supervisionA SLP Short Term Goal 4 (Week 1): Pt will solve complex problems w/ supervisionA  Skilled Therapeutic Interventions:   Pt greeted at bedside for tx targeting cognition. He requested assistance w/ positioning upon SLP arrival and SLP/RNT assisted to pull him up in bed. He demonstrated adequate reasoning during conversation re current mattress, negative impact on positioning, and potential consequences. He was then provided w/ a structured recall task re 5 words. Immediately, he recalled 4/5 words. After ~8 min delay, he recalled 1/5 words. With modA, success improved to 3/5. After an additional 15 min delay, he recalled 3/5 words, increasing to 5/5 w/ supervisionA. Between recall times, he completed a summarization task w/ a short research article, targeting selective attention (increased external noise this date), information processing, and complex problem solving @ modI. He also initiated a 3 step memory task targeting IMM and workng memory, completing the task independently thus far. At the end of tx tasks, he was left in bed w/ the call light within reach. 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 Recommend cont ST per POC.    Pain Pain Assessment Pain Scale: 0-10 Pain Score: 7  Pain Location: Leg Pain Intervention(s): Medication  (See eMAR)  Therapy/Group: Individual Therapy  [name] 02/07/2024, 7:01 AM

## 2024-02-07 NOTE — Progress Notes (Signed)
 Chaplain visit attempted, Pt was with the medical team in therapy at the time of visit. Will follow up later today.

## 2024-02-07 NOTE — Progress Notes (Signed)
 Physical Therapy Session Note  Patient Details  Name: Aaron Herrera MRN: [id number] Date of Birth: Jul 01, 2000  Today's Date: 02/07/2024 PT Individual Time: 1300-1345 PT Individual Time Calculation (min): 45 min   Short Term Goals: Week 1:  PT Short Term Goal 1 (Week 1): Pt will complete bed mobility with mod assist PT Short Term Goal 2 (Week 1): Pt will complete transfers with LRAD with mod assist PT Short Term Goal 3 (Week 1): Pt will tolerate OOB 2 hours outside of therapies  Skilled Therapeutic Interventions/Progress Updates:    Pt recd in TIS, reports up to 6/10 with RLE movement, controlled at rest. No intervention required at this time.  Pt participated in seated there Ex for UE strength and to promote upright tolerance, pt sitting upright ~ 30 min total: -gentle AROM LAQ with LLE, pt able to extend but limited by tightness in L hamstring.  -bicep curl qith 4lb dumbbells 4 x 6, assist to ensure safety and not dropping L weight.  -chest press with 5 lb dowel 2 x 10(tilted back) -incline shoulder flexion for core strength, 2 x 10 - OH press 2 x 20 -seated sit ups 2 x 10 using arm rests for support  Pt agreeable to staying up in the chair for next therapy, was left with all needs in reach and alarm active.   Therapy Documentation Precautions:  Precautions Precautions: Fall Precaution/Restrictions Comments: watch BP Required Braces or Orthoses: Knee Immobilizer - Right, Other Brace Knee Immobilizer - Right: On at all times Other Brace: R CAM boot, L post op shoe, L RNP splint, L wrist cock up splint Restrictions Weight Bearing Restrictions Per Provider Order: Yes RUE Weight Bearing Per Provider Order: Weight bearing as tolerated LUE Weight Bearing Per Provider Order: Weight bearing as tolerated RLE Weight Bearing Per Provider Order: Weight bearing as tolerated LLE Weight Bearing Per Provider Order: Non weight bearing Other Position/Activity Restrictions: no ROM of R knee,  NO ROM restrictions L knee, no shoulder ROM restrictions General:   Vital Signs: Therapy Vitals Temp: 97.8 F (36.6 C) Temp Source: Oral Pulse Rate: 92 Resp: 18 BP: 134/81 Patient Position (if appropriate): Lying Oxygen Therapy SpO2: 98 % O2 Device: Room Air Pain: Pain Assessment Pain Scale: 0-10 Pain Score: 6  Pain Location: Leg Pain Intervention(s): Medication (See eMAR) Mobility:   Locomotion :    Trunk/Postural Assessment :    Balance:   Exercises:   Other Treatments:      Therapy/Group: Individual Therapy  Schuyler JAYSON Batter 02/07/2024, 1:23 PM

## 2024-02-07 NOTE — Plan of Care (Signed)
" °  Problem: Consults Goal: RH GENERAL PATIENT EDUCATION Description: See Patient Education module for education specifics. Outcome: Progressing Goal: Skin Care Protocol Initiated - if Braden Score 18 or less Description: If consults are not indicated, leave blank or document N/A Outcome: Progressing Goal: Nutrition Consult-if indicated Outcome: Progressing   Problem: RH BOWEL ELIMINATION Goal: RH STG MANAGE BOWEL WITH ASSISTANCE Description: STG Manage Bowel with mod I Assistance. Outcome: Progressing Goal: RH STG MANAGE BOWEL W/MEDICATION W/ASSISTANCE Description: STG Manage Bowel with Medication with mod I Assistance. Outcome: Progressing   Problem: RH BLADDER ELIMINATION Goal: RH STG MANAGE BLADDER WITH ASSISTANCE Description: STG Manage Bladder With min  Assistance Outcome: Progressing Goal: RH STG MANAGE BLADDER WITH MEDICATION WITH ASSISTANCE Description: STG Manage Bladder With Medication With mod I Assistance. Outcome: Progressing Goal: RH STG MANAGE BLADDER WITH EQUIPMENT WITH ASSISTANCE Description: STG Manage Bladder With Equipment With min Assistance Outcome: Progressing   Problem: RH SKIN INTEGRITY Goal: RH STG SKIN FREE OF INFECTION/BREAKDOWN Description: Manage skin without infection with min assist Outcome: Progressing   Problem: RH SAFETY Goal: RH STG ADHERE TO SAFETY PRECAUTIONS W/ASSISTANCE/DEVICE Description: STG Adhere to Safety Precautions With cues Assistance/Device. Outcome: Progressing   Problem: RH PAIN MANAGEMENT Goal: RH STG PAIN MANAGED AT OR BELOW PT'S PAIN GOAL Description: Pain < 4 with prns Outcome: Progressing   Problem: RH KNOWLEDGE DEFICIT GENERAL Goal: RH STG INCREASE KNOWLEDGE OF SELF CARE AFTER HOSPITALIZATION Description: Patient and parents will be able to manage care using educational resources for medications, skin care/ foley care and dietary modification independently Outcome: Progressing   "

## 2024-02-07 NOTE — Progress Notes (Signed)
 Occupational Therapy Session Note  Patient Details  Name: Aaron Herrera MRN: [id number] Date of Birth: 2000-03-24  Today's Date: 02/07/2024 OT Individual Time: 0950-1100 OT Individual Time Calculation (min): 70 min   Today's Date: 02/07/2024 OT Individual Time: 1405-1500 OT Individual Time Calculation (min): 55 min    Short Term Goals: Week 1:  OT Short Term Goal 1 (Week 1): Pt will donn UB shirt with CGA for balance OT Short Term Goal 2 (Week 1): Pt will complete toilet transfer with max A OT Short Term Goal 3 (Week 1): Pt will donn LUE radial nerve palsy splint on LUE with set up A  Skilled Therapeutic Interventions/Progress Updates:   Session 1: Pt greeted resting in bed, reports of increased pain in lower back/B ribs. Pt relates discomfort due to mechanics of air mattress, RN aware and ordering new bed. Pain medication administered during session. Pt requires Max A x2 for LB dressing at bed level. Maximove utilized for bed>TIS WC transfer. Extended time required to assure patient comfort and for BLE positioning to increase OOB tolerance. Pt dependently transport to outdoor atrium for emotional wellness/health, bright affected noted throughout time outdoors. Pt and OT engage in conversation in regards to OT POC and ELOS. Pt receptive and appreciative. Mother educated on pressure relief with use of tilt features. Pt remained sitting in TIS WC, ice pack applied to L flank and L-knee for comfort.   Session 2: Pt greeted sitting in WC, mild-moderate reports of pain, rest/positioning provided as needed. Pt dependent for TIS WC transport from room<>day room. Session focused on progressing BLE tolerance towards floor-level in preparation for functional transfers. Pt's BLE elevation meticulously declined with use of 3-6 ins steps, pt able to reach floor level tolerance with RLE. Lowest BP at 114/79. While BLE in PROM, pt instructed in functional reaching task to target core strengthening. Pt  completes x8 reps of reaching towards L-side to retrieve cones and then place onto 6in step. No reports of pain. Maximove utilized for return to supine in bed. Multiple pillows utilized for positioning. Pt remained in care of mother.   Therapy Documentation Precautions:  Precautions Precautions: Fall Precaution/Restrictions Comments: watch BP Required Braces or Orthoses: Knee Immobilizer - Right, Other Brace Knee Immobilizer - Right: On at all times Other Brace: R CAM boot, L post op shoe, L RNP splint, L wrist cock up splint Restrictions Weight Bearing Restrictions Per Provider Order: Yes RUE Weight Bearing Per Provider Order: Weight bearing as tolerated LUE Weight Bearing Per Provider Order: Weight bearing as tolerated RLE Weight Bearing Per Provider Order: Weight bearing as tolerated LLE Weight Bearing Per Provider Order: Non weight bearing Other Position/Activity Restrictions: no ROM of R knee, NO ROM restrictions L knee, no shoulder ROM restrictions   Therapy/Group: Individual Therapy  Aaron Herrera, OTR/L, MSOT  02/07/2024, 6:28 AM

## 2024-02-07 NOTE — Progress Notes (Signed)
 "                                                        PROGRESS NOTE   Subjective/Complaints:  No events overnight. No acute complaints. Vitals stable , tachycardia significantly improved from last week remains intermittent with pain and anxiety    02/07/2024    4:43 AM 02/07/2024   12:11 AM 02/06/2024    8:40 PM  Vitals with BMI  Systolic 136 120 848  Diastolic 79 69 91  Pulse 96 89 111    No results for input(s): GLUCAP in the last 72 hours.  A.m. BMP pending, CBC stable P.o. intakes appropriate   Foley remains in place Last BM 1-11, large x 2  ROS: Patient denies fever, rash, sore throat, blurred vision, dizziness, nausea, vomiting, diarrhea, cough, shortness of breath or chest pain,   headache, or mood change.   Objective:   No results found.  Recent Labs    02/07/24 0606  WBC 6.5  HGB 9.5*  HCT 29.2*  PLT 493*    No results for input(s): NA, K, CL, CO2, GLUCOSE, BUN, CREATININE, CALCIUM  in the last 72 hours.   Intake/Output Summary (Last 24 hours) at 02/07/2024 0750 Last data filed at 02/07/2024 0503 Gross per 24 hour  Intake 236 ml  Output 2650 ml  Net -2414 ml        Physical Exam: Vital Signs Blood pressure 136/79, pulse 96, temperature 98 F (36.7 C), temperature source Oral, resp. rate 18, height 5' 11 (1.803 m), weight 104.5 kg, SpO2 98%.  Constitutional: No distress . Vital signs reviewed. HEENT: NCAT, EOMI, oral membranes moist Neck: supple Cardiovascular: RRR without murmur. No JVD    Respiratory/Chest: CTA Bilaterally without wheezes or rales. Normal effort    GI/Abdomen: BS +, non-tender, non-distended Ext: no clubbing, cyanosis, or edema Psych: pleasant, a little flat but cooperative  Neurologic Exam:   Sensory exam: revealed normal sensation in all dermatomal regions in bilateral lower extremities, right upper extremity, and with reduced sensation to light touch in L dorsal thumb  Motor exam: strength 5/5 throughout  right upper extremity and with exception of LUE 3/5 FF, 0/5 FA, 0/5 WE, 4/5 EF, 3/5 EE, 4/5 SA--stable in appearance 1/10  BL LE wiggles all toes, strength antigravity, testing limited by bracing and weightbearing precautions. Coordination: Fine motor coordination was normal where it could be tested.     MSK: Bl LE fractures; RLE wrapped in soft brace   Skin:incisions intact  Physical exam unchanged from the above on reexamination 02/07/2024     Assessment/Plan: 1. Functional deficits which require 3+ hours per day of interdisciplinary therapy in a comprehensive inpatient rehab setting. Physiatrist is providing close team supervision and 24 hour management of active medical problems listed below. Physiatrist and rehab team continue to assess barriers to discharge/monitor patient progress toward functional and medical goals  Care Tool:  Bathing    Body parts bathed by patient: Left arm, Chest, Abdomen, Front perineal area, Right upper leg, Left upper leg   Body parts bathed by helper: Right arm, Buttocks, Left lower leg Body parts n/a: Right lower leg   Bathing assist Assist Level: Moderate Assistance - Patient 50 - 74%     Upper Body Dressing/Undressing Upper body dressing   What is the patient  wearing?: Pull over shirt    Upper body assist Assist Level: Minimal Assistance - Patient > 75%    Lower Body Dressing/Undressing Lower body dressing      What is the patient wearing?: Pants     Lower body assist Assist for lower body dressing: Total Assistance - Patient < 25%     Toileting Toileting    Toileting assist Assist for toileting: 2 Helpers     Transfers Chair/bed transfer  Transfers assist  Chair/bed transfer activity did not occur: Safety/medical concerns (orthostatic hypotension)  Chair/bed transfer assist level: Total Assistance - Patient < 25%     Locomotion Ambulation   Ambulation assist   Ambulation activity did not occur: Safety/medical  concerns          Walk 10 feet activity   Assist  Walk 10 feet activity did not occur: Safety/medical concerns        Walk 50 feet activity   Assist Walk 50 feet with 2 turns activity did not occur: Safety/medical concerns         Walk 150 feet activity   Assist Walk 150 feet activity did not occur: Safety/medical concerns         Walk 10 feet on uneven surface  activity   Assist Walk 10 feet on uneven surfaces activity did not occur: Safety/medical concerns         Wheelchair     Assist Is the patient using a wheelchair?: Yes Type of Wheelchair: Manual Wheelchair activity did not occur: Safety/medical concerns         Wheelchair 50 feet with 2 turns activity    Assist    Wheelchair 50 feet with 2 turns activity did not occur: Safety/medical concerns       Wheelchair 150 feet activity     Assist  Wheelchair 150 feet activity did not occur: Safety/medical concerns       Blood pressure 136/79, pulse 96, temperature 98 F (36.7 C), temperature source Oral, resp. rate 18, height 5' 11 (1.803 m), weight 104.5 kg, SpO2 98%.  Medical Problem List and Plan: 1. Functional deficits secondary to polytrauma             -patient may shower with surgical dressings covered             -ELOS/Goals: 12-16 days, Mod A PT/OT at [facility] level -Continue CIR therapies including PT, OT,SLP    Weight bearing: WBAT B/L upper extremities. May weight bear on right leg for transfers in boot and knee immobilizer NWB Left leg and should wear post op shoe on left foot when transferring    Splinting: L radial nerve palsy splint (finger bands) during daytime hours PRN for exercise and functional tasks; SLEEP in black wrist cock-up splint and wear PRN during the day when not wearing other splint to prevent wrist drop.    2.  Antithrombotics: -DVT/anticoagulation:  Mechanical: Sequential compression devices, below knee Bilateral lower extremities Pharmaceutical:  Lovenox              -antiplatelet therapy: n/a             -BL LE duplex dopplers negative   3. Pain Management: Tylenol  1000 mg q6h, gabapentin  300 mg 3 times daily.  Oxycodone  10 mg q4h and  Robaxin  500 mg q6 PRN.   1/8: Reduce tylenol  to 1000 mg TID, schedule robaxin  500 mg QID, schedule oxycodone  5 mg QID and reduce PRN to 5 mg Q6H PRN  1/11 still chasing pain  at times. Pain wakes him up overnight   -will change scheduled oxycodone  5 q6 to oxycontin , titrate that if needed.   - 1-12: Monitor on OxyContin   4. Mood/Behavior/Sleep: LCSW to follow for evaluation and support when available.              -anxiety: d/t poor sleep, worry, and claustrophobia; denies nightmares or re-living traumatic event. Continue hydroxyzine  at bedtime.              -sleep: melatonin 5 mg at bedtime.   - 1-8: Not using as needed Atarax , continue nightly scheduled and DC 4 times daily as needed.  If anxiety ongoing limiting factor, will discuss initiation of BuSpar .  1/9-10: Started BuSpar  7.5 mg twice daily.  Encouraged use of as needed Atarax  at night if needed--monitor this weekend.  1/11- some improvement but will increase to 10mg  bid as anxiety still a factor --monitor 1 to 2 days on increased regimen   5. Neuropsych/cognition: Psych consult ordered 1/6. Neuropsych Consult ordered in CIR.  This patient is capable of making decisions on his own behalf.   6. Skin/Wound Care: Routine pressure relief measures.               - 1 week post-op; will reach out to orthopedic surgical teams regarding timeline for suture/staple removal, RLE wrapping   -- Per Dr. Reyne, okay to remove sutures/staples. Ordered 1-8 - 1/9: Per Lauraine Moores, left humerus and right tibial incisions can be left open to air.  Per Dr. Josefina, right ankle dressing to remain intact unless saturated.   1/11 I reiterated this plan to pt/father this morning  7. Fluids/Electrolytes/Nutrition: Monitor strict I&O and weights. Follow up labs CBC/CMP                -Regular diet + Ensure and vitamin supplements              - Good p.o. intakes overall   8.  Splenic laceration: 2 cm, monitor hemoglobin with CBC.   9.  Possible liver laceration: Small left zone 2/3 retroperitoneal hematoma.  Same as above   10.  ABLA: S/p 4 units PRBC, 1 unit whole blood, 2 unit FFP on 12/30.  Hemoglobin 8.9 from 8.8.  PLTs improved now to 63.               - 1-7: Stable on admission labs, follow-up labs   11.  AKI: Related to hypovolemia on admission, resolved.  Encourage oral hydration. Monitor BMP               - 1/7: BUN/creatinine stable; monitor, stable at 1-7   - 1-12: A.m. labs pending  12.  Tachycardia/hypertension: Continue Lopressor  25 mg twice daily.               - 1/7: Hypotensive with therapies + tachycardic - 500 cc IVF bolus today--responsive   - 1/8: Remains tachycardic 120s; titrate pain regimen as above; repeat orthostats and add 75 cc/hr drip if +; encourage PO fluids - may need to increase lopressor   1/11 HR a little better this morning (105) -continue lopressor  at 50mg  bid to see if this better controls without dropping bp too much -rx pain and anxiety as above #12: Somewhat improved, monitor       02/02/2024    7:34 AM 02/02/2024    4:19 AM 02/02/2024   12:37 AM  Vitals with BMI  Systolic 154 151 863  Diastolic 81 92 78  Pulse  117 115 109    13.  Foley: TOV failed 1/1 & 1/5. Foley replaced on 1/6 - Continue Flomax .  1/7 Flomax  moved to at bedtime due to orthostasis.  1/8: + BM yesterday; will plan foley trial Monday 1/12 --order  14.  Constipation: received mag citrate 1/6 LBM unsure of success. KUB ordered.              -Miralax  increased to BID. Colace BID, as needed enema.              1/7: Straining with orthostasis - give bowel prep  1/8: medium BM with suppository and prep - schedule miralax  17 g BID, sennakot S 1 tab BID; encourage PO fluids as above  1-9: Increase Senokot to 2 tabs twice daily   - Multiple large  BMs 1-11  15. Right 4 through 7, left 9 through 10 rib fractures w/pulmonary contusions: Continue pain control and pulmonary toilet.             - Encourage incentive spirometer and flutter valve. 16. L2-5 transverse process fractures - Pain control. 17. Right scapular spine fracture - Dr. Reyne consulted. This to be treated nonoperatively.  18. Right femur fracture - s/p IM rod 12/30 Dr. Reyne. 19. Open right tib/fib fracture - s/p IM rod right tibia 12/30 Dr. Reyne, ORIF R medial malleolus, Right partial patellectomy Dr. Josefina. 20. Left humeral fracture - ORIF 12/31 Dr. Kendal. 21. Left femur fracture - s/p IM rod 12/30 Dr. Josefina. 22. Left foot fractures - s/p closed reduction left toes 12/30. 23. Left tibial plateau fracture -  washed out 12/30 Dr. Reyne, ORIF 12/31 Hr. Haddix.  24. Obesity. Body mass index is 32.13 kg/m. Complicates all aspects of care.   LOS: 6 days A FACE TO FACE EVALUATION WAS PERFORMED  Joesph JAYSON Likes 02/07/2024, 7:50 AM     "

## 2024-02-08 ENCOUNTER — Encounter (HOSPITAL_COMMUNITY): Payer: Self-pay | Admitting: Student

## 2024-02-08 MED ORDER — METHOCARBAMOL 500 MG PO TABS
1000.0000 mg | ORAL_TABLET | Freq: Four times a day (QID) | ORAL | Status: DC
  Administered 2024-02-08 – 2024-02-18 (×40): 1000 mg via ORAL
  Filled 2024-02-08 (×40): qty 2

## 2024-02-08 MED ORDER — TIZANIDINE HCL 4 MG PO TABS
2.0000 mg | ORAL_TABLET | Freq: Four times a day (QID) | ORAL | Status: DC | PRN
  Filled 2024-02-08 (×2): qty 1

## 2024-02-08 NOTE — Progress Notes (Signed)
 Physical Therapy Session Note  Patient Details  Name: Aaron Herrera MRN: [id number] Date of Birth: [birth year]  Today's Date: 02/08/2024 PT Individual Time: 1105-1200 + [id number] PT Individual Time Calculation (min): 55 min + 28 min  Short Term Goals: Week 1:  PT Short Term Goal 1 (Week 1): Pt will complete bed mobility with mod assist PT Short Term Goal 2 (Week 1): Pt will complete transfers with LRAD with mod assist PT Short Term Goal 3 (Week 1): Pt will tolerate OOB 2 hours outside of therapies  Skilled Therapeutic Interventions/Progress Updates:    SESSION 1: Pt presents in room in [facility], agreeable to PT. Pt denies pain at rest however does report pain with all BLE mobility. Pt DO present during session and notified of pt pain with mobility limiting participation with bed mobility and transfers. Session focused on therapeutic activities to facilitate improved participation with transfers as well as therapeutic exercise to promote BUE and LLE strength and ROM. Pt transported via WC to day room and positioned next to mat. Pt requires increased time to position due to increased pain in BLEs in dependent position. Pt set up for slideboard transfer, cues provided to promote improved safety during transfer as well as improve participation and mechanics. Pt requires mod assist x2 for first transfer WC to mat, vitals assessed and WNL despite pt reporting initial lightheadedness seated EOM, improves with extended rest break with pt in unsupported sitting ~5 minutes. Pt then completes slideboard tranfser with min assist x2 mat to [facility], therapist managing BLEs and second person placing/holding board during transfer. Pt then tolerates LLE AROM required for transfers including L heel slides x10 and LLE marches x5 AAROM. At this point pt reporting feeling hot, lightheaded, and clammy. Pt tilted posteriorly in WC and vitals assessed to again be WNL. Pt then completes BUE therex including chest press 4# x10, seated  rows yellow tband x10, band pull aparts yellow band x10. Pt returns to room and remains seated in [facility] with all needs within reach, cal light in place at end of session.   SESSION 2: Pt presents in room in [facility], agreeable to PT. Pt reporting discomfort in LLE from prolonged positioning but denies pain. Session focused on therapeutic activities to promote improved participation with WC>bed transfers and bed mobility. Pt positioned with increased time to set up for slideboard transfer, then completes with min assist x2 with improved gluteal clearance with sliding and denies RLE pain during transfer. Pt remains in unsupported sitting x2 minutes, then completes bed mobility with max assist x2, cues for sequencing bed mobility, therapist providing max assist for BLE management as pt with insufficient strength to lift on his own. Pt then completes BLE exercises to promote improved bed mobility and ROM including heel slides LLE x10, LLE SLR x10, RLE SLR x10. Pt remains semi reclined with pillows positioned for comfort, all needs within reach, call light in place, and bed alarm activated at end of session.    Therapy Documentation Precautions:  Precautions Precautions: Fall Precaution/Restrictions Comments: watch BP Required Braces or Orthoses: Knee Immobilizer - Right, Other Brace Knee Immobilizer - Right: On at all times Other Brace: R CAM boot, L post op shoe, L RNP splint, L wrist cock up splint Restrictions Weight Bearing Restrictions Per Provider Order: Yes RUE Weight Bearing Per Provider Order: Weight bearing as tolerated LUE Weight Bearing Per Provider Order: Weight bearing as tolerated RLE Weight Bearing Per Provider Order: Weight bearing as tolerated LLE Weight Bearing Per Provider  Order: Non weight bearing Other Position/Activity Restrictions: no ROM of R knee, NO ROM restrictions L knee, no shoulder ROM restrictions   Therapy/Group: Individual Therapy  Reche Ohara PT, DPT 02/08/2024,  6:21 PM

## 2024-02-08 NOTE — Progress Notes (Signed)
 Speech Language Pathology Daily Session Note  Patient Details  Name: ONOFRIO KLEMP MRN: [id number] Date of Birth: 08-19-2000  Today's Date: 02/08/2024 SLP Individual Time: 8697-8653 SLP Individual Time Calculation (min): 44 min  Short Term Goals: Week 1: SLP Short Term Goal 1 (Week 1): Pt will recall detailed information w/ 90% accuracy given supervisionA SLP Short Term Goal 2 (Week 1): Pt will process complex information w/ supervisionA SLP Short Term Goal 3 (Week 1): Pt will complex cognitive tasks targeting selective attention or greater w/ supervisionA SLP Short Term Goal 4 (Week 1): Pt will solve complex problems w/ supervisionA  Skilled Therapeutic Interventions:  Patient was seen in am to address cognitive re- training. Pt was alert and seated upright in WC upon SLP arrival. He denied pain and was agreeable for session. Pt reviewed master's program and details. He feels very close to his cognitive baseline and does endorse some continued changes in memory. SLP challenging pt in complex problem solving and recall. Given a complex paragraph retention task, pt recalled information after a delay with 83% acc indep. He completed a complex problem solving task with 100% acc indep. He was also challenged in a thought organization task which he completed with sup A. At conclusion of session, pt was left upright in [facility] with call button within reach. SLP to continue POC.   Pain Pain Assessment Pain Scale: 0-10 Pain Score: 0-No pain  Therapy/Group: Individual Therapy  Joane GORMAN Fuss 02/08/2024, 1:06 PM

## 2024-02-08 NOTE — Progress Notes (Signed)
 Occupational Therapy Weekly Progress Note  Patient Details  Name: Aaron Herrera MRN: [id number] Date of Birth: 2000/03/31  Beginning of progress report period: February 02, 2024 End of progress report period: February 08, 2024  Today's Date: 02/08/2024 OT Individual Time: 9194-9084 OT Individual Time Calculation (min): 70 min    Patient has met 2 of 3 short term goals this reporting period. See below for CLOF. Discharge date set for 1/28. Caregiver education to be scheduled prior to discharge.   Patient continues to demonstrate the following deficits: muscle weakness and muscle joint tightness, decreased cardiorespiratoy endurance, unbalanced muscle activation and decreased coordination, and decreased sitting balance, decreased standing balance, and decreased balance strategies and therefore will continue to benefit from skilled OT intervention to enhance overall performance with BADL and Reduce care partner burden.  Patient progressing toward long term goals..  Continue plan of care.  OT Short Term Goals Week 1:  OT Short Term Goal 1 (Week 1): Pt will donn UB shirt with CGA for balance OT Short Term Goal 1 - Progress (Week 1): Met OT Short Term Goal 2 (Week 1): Pt will complete toilet transfer with max A OT Short Term Goal 2 - Progress (Week 1): Not met OT Short Term Goal 3 (Week 1): Pt will donn LUE radial nerve palsy splint on LUE with set up A OT Short Term Goal 3 - Progress (Week 1): Met Week 2:  OT Short Term Goal 1 (Week 2): Pt will perform toilet transfer with Mod A (x1). OT Short Term Goal 2 (Week 2): Pt will thread LB garments with Mod A + AE. OT Short Term Goal 3 (Week 2): Pt will roll laterally in bed with Mod A (x1) to decrease burden of care.  Skilled Therapeutic Interventions/Progress Updates:   Pt greeted resting in bed, un-rated pain in L-knee and anterior thigh, pre-medicated, rest provided as needed. Session focused on BADL retraining. Bed-level LB care completed at  Mod A level including Max A for rolling laterally. Pt able to bathe anterior periarea, B-upper thighs and dependent for distal LLE and posterior periarea due to decreased functional reach and increased pain in side-lying. Total A required for donning pants and placement of Maximove. +2 recruited from transfer OOB. Extended time required for positioning in TIS WC. Sitting at sink-side, patient bathes/dresses UB with Min A due to decreased L-functional grip and material entanglement. Dependent hair care provided for emotional wellness. Pt remained sitting in TIS WC with all immediate needs met and call bell within reach.   Therapy Documentation Precautions:  Precautions Precautions: Fall Precaution/Restrictions Comments: watch BP Required Braces or Orthoses: Knee Immobilizer - Right, Other Brace Knee Immobilizer - Right: On at all times Other Brace: R CAM boot, L post op shoe, L RNP splint, L wrist cock up splint Restrictions Weight Bearing Restrictions Per Provider Order: Yes RUE Weight Bearing Per Provider Order: Weight bearing as tolerated LUE Weight Bearing Per Provider Order: Weight bearing as tolerated RLE Weight Bearing Per Provider Order: Weight bearing as tolerated LLE Weight Bearing Per Provider Order: Non weight bearing Other Position/Activity Restrictions: no ROM of R knee, NO ROM restrictions L knee, no shoulder ROM restrictions   Therapy/Group: Individual Therapy  Nereida Habermann, OTR/L, MSOT  02/08/2024, 6:11 AM

## 2024-02-08 NOTE — Progress Notes (Signed)
 "                                                        PROGRESS NOTE   Subjective/Complaints:  No events overnight. No acute complaints.  Had a long talk with patient about limitations of progress with therapies due to poorly controlled pain, patient endorses primarily in the proximal inner left thigh as a muscle burning in the bilateral knees with any kind of bending or movement.  Vitals are gradually looking much better, normotensive, heart rate down into the low 100s  No intermittent straight cathing required overnight, continent of bladder. last BM 1-11, large x 2  ROS: Patient denies fever, rash, sore throat, blurred vision, dizziness, nausea, vomiting, diarrhea, cough, shortness of breath or chest pain,   headache, or mood change.  + Bilateral knee pain + Left inner thigh pain + Anxiety  Objective:   No results found.  Recent Labs    02/07/24 0606  WBC 6.5  HGB 9.5*  HCT 29.2*  PLT 493*    Recent Labs    02/07/24 0606  NA 139  K 4.2  CL 103  CO2 23  GLUCOSE 93  BUN 21*  CREATININE 0.83  CALCIUM  9.5     Intake/Output Summary (Last 24 hours) at 02/08/2024 0944 Last data filed at 02/08/2024 0736 Gross per 24 hour  Intake 756 ml  Output 2850 ml  Net -2094 ml        Physical Exam: Vital Signs Blood pressure 131/81, pulse 100, temperature 97.9 F (36.6 C), temperature source Oral, resp. rate 20, height 5' 11 (1.803 m), weight 104.5 kg, SpO2 97%.  Constitutional: No distress . Vital signs reviewed.  Sitting upright in therapy gym. HEENT: NCAT, EOMI, oral membranes moist Neck: supple Cardiovascular: RRR without murmur. No JVD    Respiratory/Chest: CTA Bilaterally without wheezes or rales. Normal effort    GI/Abdomen: BS +, non-tender, non-distended Ext: no clubbing, cyanosis, or edema Psych: pleasant, a little flat but cooperative  Neurologic Exam:    Sensory exam: revealed normal sensation in all dermatomal regions in bilateral lower extremities,  right upper extremity, and with reduced sensation to light touch in L dorsal thumb  Motor exam: strength 5/5 throughout right upper extremity and with exception of LUE 3/5 FF, 0/5 FA, 0/5 WE, 4/5 EF, 3/5 EE, 4/5 SA--stable Antigravity all 4 extremities, limited by bracing and pain   MSK: Bl LE fractures; RLE wrapped in soft brace at the thigh and hard brace at the ankle   Skin:incisions intact--some staples remain in the left proximal thigh and right knee.   Assessment/Plan: 1. Functional deficits which require 3+ hours per day of interdisciplinary therapy in a comprehensive inpatient rehab setting. Physiatrist is providing close team supervision and 24 hour management of active medical problems listed below. Physiatrist and rehab team continue to assess barriers to discharge/monitor patient progress toward functional and medical goals  Care Tool:  Bathing    Body parts bathed by patient: Left arm, Chest, Abdomen, Front perineal area, Right upper leg, Left upper leg   Body parts bathed by helper: Right arm, Buttocks, Left lower leg Body parts n/a: Right lower leg   Bathing assist Assist Level: Moderate Assistance - Patient 50 - 74%     Upper Body Dressing/Undressing Upper body dressing  What is the patient wearing?: Pull over shirt    Upper body assist Assist Level: Minimal Assistance - Patient > 75%    Lower Body Dressing/Undressing Lower body dressing      What is the patient wearing?: Pants     Lower body assist Assist for lower body dressing: Total Assistance - Patient < 25%     Toileting Toileting    Toileting assist Assist for toileting: 2 Helpers     Transfers Chair/bed transfer  Transfers assist  Chair/bed transfer activity did not occur: Safety/medical concerns (orthostatic hypotension)  Chair/bed transfer assist level: Total Assistance - Patient < 25%     Locomotion Ambulation   Ambulation assist   Ambulation activity did not occur:  Safety/medical concerns          Walk 10 feet activity   Assist  Walk 10 feet activity did not occur: Safety/medical concerns        Walk 50 feet activity   Assist Walk 50 feet with 2 turns activity did not occur: Safety/medical concerns         Walk 150 feet activity   Assist Walk 150 feet activity did not occur: Safety/medical concerns         Walk 10 feet on uneven surface  activity   Assist Walk 10 feet on uneven surfaces activity did not occur: Safety/medical concerns         Wheelchair     Assist Is the patient using a wheelchair?: Yes Type of Wheelchair: Manual Wheelchair activity did not occur: Safety/medical concerns         Wheelchair 50 feet with 2 turns activity    Assist    Wheelchair 50 feet with 2 turns activity did not occur: Safety/medical concerns       Wheelchair 150 feet activity     Assist  Wheelchair 150 feet activity did not occur: Safety/medical concerns       Blood pressure 131/81, pulse 100, temperature 97.9 F (36.6 C), temperature source Oral, resp. rate 20, height 5' 11 (1.803 m), weight 104.5 kg, SpO2 97%.  Medical Problem List and Plan: 1. Functional deficits secondary to polytrauma             -patient may shower with surgical dressings covered             -ELOS/Goals: 12-16 days, Mod A PT/OT at [facility] level - 02/23/24 -Continue CIR therapies including PT, OT,SLP    Weight bearing: WBAT B/L upper extremities. May weight bear on right leg for transfers in boot and knee immobilizer NWB Left leg and should wear post op shoe on left foot when transferring    Splinting: L radial nerve palsy splint (finger bands) during daytime hours PRN for exercise and functional tasks; SLEEP in black wrist cock-up splint and wear PRN during the day when not wearing other splint to prevent wrist drop.    1/13: DC home with parents, mom primary caregiver and concerned that she can;t Do much; dad works during daytime.  Max A bed mobility very limited by pain and anxiety - up to EOB yesterday - trying slideboard transfer today.  Min A UB, Max A LB care. Downgrading goals to Min A. SLP mild cognitive deficits  - SPV to ind with memory as biuggest deficit.    2.  Antithrombotics: -DVT/anticoagulation:  Mechanical: Sequential compression devices, below knee Bilateral lower extremities Pharmaceutical: Lovenox              -antiplatelet therapy: n/a             -  BL LE duplex dopplers negative   3. Pain Management: Tylenol  1000 mg q6h, gabapentin  300 mg 3 times daily.  Oxycodone  10 mg q4h and  Robaxin  500 mg q6 PRN.   1/8: Reduce tylenol  to 1000 mg TID, schedule robaxin  500 mg QID, schedule oxycodone  5 mg QID and reduce PRN to 5 mg Q6H PRN  1/11 still chasing pain at times. Pain wakes him up overnight   -will change scheduled oxycodone  5 q6 to oxycontin , titrate that if needed.   - 1-12: Monitor on OxyContin   - 1-13: Increase Robaxin  to 1000 mg every 6 hours scheduled, add tizanidine  2 mg every 6 hours as needed, and encouraged use of as needed oxycodone  prior to therapies  4. Mood/Behavior/Sleep: LCSW to follow for evaluation and support when available.              -anxiety: d/t poor sleep, worry, and claustrophobia; denies nightmares or re-living traumatic event. Continue hydroxyzine  at bedtime.              -sleep: melatonin 5 mg at bedtime.   - 1-8: Not using as needed Atarax , continue nightly scheduled and DC 4 times daily as needed.  If anxiety ongoing limiting factor, will discuss initiation of BuSpar .  1/9-10: Started BuSpar  7.5 mg twice daily.  Encouraged use of as needed Atarax  at night if needed--monitor this weekend.  1/11- some improvement but will increase to 10mg  bid as anxiety still a factor --monitor 1 to 2 days on increased regimen 1-13: Patient endorses this is generally well-controlled, major triggers using bedpan but otherwise doing better   5. Neuropsych/cognition: Psych consult ordered 1/6.  Neuropsych Consult ordered in CIR.  This patient is capable of making decisions on his own behalf.   6. Skin/Wound Care: Routine pressure relief measures.               - 1 week post-op; will reach out to orthopedic surgical teams regarding timeline for suture/staple removal, RLE wrapping   -- Per Dr. Reyne, okay to remove sutures/staples. Ordered 1-8 - 1/9: Per Lauraine Moores, left humerus and right tibial incisions can be left open to air.  Per Dr. Josefina, right ankle dressing to remain intact unless saturated.   1-13: Remove remaining staples in the left proximal thigh and right knee.  7. Fluids/Electrolytes/Nutrition: Monitor strict I&O and weights. Follow up labs CBC/CMP               -Regular diet + Ensure and vitamin supplements              - Good p.o. intakes overall   8.  Splenic laceration: 2 cm, monitor hemoglobin with CBC.   9.  Possible liver laceration: Small left zone 2/3 retroperitoneal hematoma.  Same as above   10.  ABLA: S/p 4 units PRBC, 1 unit whole blood, 2 unit FFP on 12/30.  Hemoglobin 8.9 from 8.8.  PLTs improved now to 63.               - 1-7: Stable on admission labs, follow-up labs   11.  AKI: Related to hypovolemia on admission, resolved.  Encourage oral hydration. Monitor BMP               - 1/7: BUN/creatinine stable; monitor, stable at 1-7   - 1-12: A.m. labs stable, continue to encourage p.o. fluids, blood pressure holding up better  12.  Tachycardia/hypertension: Continue Lopressor  25 mg twice daily.               -  1/7: Hypotensive with therapies + tachycardic - 500 cc IVF bolus today--responsive   - 1/8: Remains tachycardic 120s; titrate pain regimen as above; repeat orthostats and add 75 cc/hr drip if +; encourage PO fluids - may need to increase lopressor   1/11 HR a little better this morning (105) -continue lopressor  at 50mg  bid to see if this better controls without dropping bp too much -rx pain and anxiety as above 1/12-13: Improving with pain  control, monitor.       02/02/2024    7:34 AM 02/02/2024    4:19 AM 02/02/2024   12:37 AM  Vitals with BMI  Systolic 154 151 863  Diastolic 81 92 78  Pulse 117 115 109    13.  Foley: TOV failed 1/1 & 1/5. Foley replaced on 1/6 - Continue Flomax .  1/7 Flomax  moved to at bedtime due to orthostasis.  1/8: + BM yesterday; will plan foley trial Monday 1/12 --order 1/13: Continues with high volume output but has been continent of all voids, continue current regimen  14.  Constipation: received mag citrate 1/6 LBM unsure of success. KUB ordered.              -Miralax  increased to BID. Colace BID, as needed enema.              1/7: Straining with orthostasis - give bowel prep  1/8: medium BM with suppository and prep - schedule miralax  17 g BID, sennakot S 1 tab BID; encourage PO fluids as above  1-9: Increase Senokot to 2 tabs twice daily   - Multiple large BMs 1-11.   1-13: Patient endorses trouble using bedpan as huge source of anxiety, encouraged to practice transfers to assist in changing this.  15. Right 4 through 7, left 9 through 10 rib fractures w/pulmonary contusions: Continue pain control and pulmonary toilet.             - Encourage incentive spirometer and flutter valve. 16. L2-5 transverse process fractures - Pain control. 17. Right scapular spine fracture - Dr. Reyne consulted. This to be treated nonoperatively.  18. Right femur fracture - s/p IM rod 12/30 Dr. Reyne. 19. Open right tib/fib fracture - s/p IM rod right tibia 12/30 Dr. Reyne, ORIF R medial malleolus, Right partial patellectomy Dr. Josefina. 20. Left humeral fracture - ORIF 12/31 Dr. Kendal. 21. Left femur fracture - s/p IM rod 12/30 Dr. Josefina. 22. Left foot fractures - s/p closed reduction left toes 12/30. 23. Left tibial plateau fracture -  washed out 12/30 Dr. Reyne, ORIF 12/31 Hr. Haddix.  24. Obesity. Body mass index is 32.13 kg/m. Complicates all aspects of care.   LOS: 7 days A FACE TO FACE  EVALUATION WAS PERFORMED  Aaron Herrera 02/08/2024, 9:44 AM     "

## 2024-02-08 NOTE — Patient Care Conference (Signed)
 Inpatient RehabilitationTeam Conference and Plan of Care Update Date: 02/08/2024   Time: 1036 am    Patient Name: Aaron Herrera      Medical Record Number: [id number]  Date of Birth: 2000/04/07 Sex: Male         Room/Bed: 5T95R/5T95R-98 Payor Info: Payor: [place] MEDICAID PREPAID HEALTH PLAN / Plan: [place] MEDICAID HEALTHY BLUE / Product Type: *No Product type* /    Admit Date/Time:  02/01/2024  5:33 PM  Primary Diagnosis:  Critical polytrauma  Hospital Problems: Principal Problem:   Critical polytrauma    Expected Discharge Date: Expected Discharge Date: 02/23/24  Team Members Present: Physician leading conference: Dr. Joesph Likes Social Worker Present: Graeme Jude, LCSW Nurse Present: Eulalio Falls, RN PT Present: Catilin Osborn, PT OT Present: Nereida Habermann, OT SLP Present: Rosina Downy, SLP     Current Status/Progress Goal Weekly Team Focus  Bowel/Bladder     Continues with high volume output but has been continent of all voids   after foley was d/c'd   incontinent of bowels    Remain continence of bowel and bladder    Assess bowel an bladder q shift  Swallow/Nutrition/ Hydration               ADL's   Min A for UB care; Max A for LB care. 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 - supervisionA to independent - targeting: memory, selective attention and greater, information processing, complex problem solving  - memory appears to be greatest deficit - completing tasks of  increased complexity each day   modI   cognitive tasks of increased complexity, cognitive retraining, pt/family education    Pain     Back / leg pain     <4 w/ prns    <4 w/ prns   Skin    Surgical incision pre tibial Surgical wound right leg;  Traumatic arm anterior, distal; left upper arm  left humerus and right tibial incisions can be left open to air.right ankle dressing to remain intact unless saturated.   Remaining staples removed left proximal thigh and right knee.    Skin free from infection entire stay on rehab    Assess skin q shift    Discharge Planning:  Pt d/c to home with his parents. Mother is not able to provide any physical assistance. Pt father works and helpful in the evening due to work. SW will confirm there are no barriers to discharge.    Team Discussion: Patient was admitted post polytrauma. Patient with pain, anxiety : medication adjusted by MD Patient progress limited by mild cognitive deficits, decreased activity tolerance and memory deficits.   Patient on target to meet rehab goals: Currently patient needs min assistance with upper body care and max assistance with lower body care at bed level due to limited ROM of lower body care. Maxi move for tranfers with total assistance + 2. Goals downgraded to min-supervision assistance.   *See Care Plan and progress notes for long and  short-term goals.   Revisions to Treatment Plan:  Weight bearing restrictions Knee immobilizer L radial nerve palsy splint (finger bands) during daytime  Slide board  Downgraded goals  Teaching Needs: Safety, medications, incision care, weight bearing restrictions, transfers,toileting, etc   Current Barriers to Discharge: Decreased caregiver support, Home enviroment access/layout, Wound care, and Weight bearing restrictions  Possible Resolutions to Barriers: Family education Possible PCS referral      Medical Summary Current Status: Medically complicated by  polytrauma with bilateral lower extremity fractures, complex, wound care, hypertension, orthostatic hypotension, tachycardia, poorly controlled pain, anxiety, constipation, and urinary retention  Barriers to Discharge: Behavior/Mood;Hypotension;Medical stability;Self-care education;Uncontrolled Pain;Uncontrolled Hypertension;Weight bearing restrictions   Possible Resolutions to [name]: Monitor wounds and offload for healing, treat hypertension and tachycardia and monitor for contributors of uncontrolled pain, titrate anxiety medications and provide support, encourage p.o. fluids and monitor urinary output   Continued Need for Acute Rehabilitation Level of Care: The patient requires daily medical management by a physician with specialized training in physical medicine and rehabilitation for the following reasons: Direction of a multidisciplinary physical rehabilitation program to maximize functional independence : Yes Medical management of patient stability for increased activity during participation in an intensive rehabilitation regime.: Yes Analysis of laboratory values and/or radiology reports with any subsequent need for medication adjustment and/or medical intervention. : Yes   I attest that I was present, lead the team conference, and concur with the assessment and plan of the team.   Avigail Pilling Gayo 02/08/2024, 1036 am

## 2024-02-08 NOTE — Progress Notes (Signed)
" ° ° °  Chaplain responded to Aaron Herrera consult for support. Chaplain team has been unable to connect with pt for several days due to care schedule. Chaplain introduced [name] consult and offered support in the setting of inpatient admission.  Chaplain asked open ended questions to facilitate emotional expression and story telling. Aaron Herrera shared that he was initially quite discouraged with his status, but has been able to see some improvement, which is giving him hope. Chaplain utilized reflective listening to identify challenges as well as sources of hope and coping strategies.  In addition to his physical challenges, Aaron Herrera is also recovering from a concussion, which has limited his attention and ability to engage in some of his preferred activities. 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 He acknowledged feelings of grief due to the delay and his cohort advancing without him, but is grateful for their continued friendship. 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 He has regular daily visits from his parents and found some encouragement from a recent visit outside. Chaplain affirmed his resilience and normalized the duality of emotion. He is aware that continued support is available and that requesting the chaplain be paged may lead to a more timely visit.  Alan HERO. Davee Lomax, M.Div. [facility] Chaplain Pager [phone] Office [phone]   02/08/24 1001  Spiritual Encounters  Type of Visit Follow up  Care provided to: Patient  Reason for visit Routine spiritual support  Spiritual Framework  Presenting Themes Goals in life/care;Values and beliefs;Significant life change;Caregiving needs;Coping tools;Impactful experiences and emotions;Courage hope and growth  Community/Connection Friend(s);Family  Strengths Resilience  Patient Stress Factors Health changes;Loss of control;Loss   Goals  Self/Personal Goals Return to MSW program  Interventions  Spiritual Care Interventions Made Established relationship of care and support;Compassionate presence;Reflective listening;Narrative/life review;Meaning making;Encouragement  Intervention Outcomes  Outcomes Connection to spiritual care;Awareness of support;Reduced isolation    "

## 2024-02-08 NOTE — Progress Notes (Signed)
 Patient ID: Aaron Herrera, male   DOB: 15-Jul-2000, 24 y.o.   MRN: [id number]  SW met with pt in room to provide updates from team conference, and d/c date 1/28. He is aware a PCS referral will be submitted. Pt aware SW will follow-up with pt mother.   *SW later met with pt and pt mother in room to discuss above. She reports concerns about the discharge date. She states there are still things that need to be done at the home as they are working on add on to the home and concerned about doorway for w/c and bathroom. Also stating that construction will likely not start until later this week or next week. They are still working on ramp as well. SW reiterated that a PCS referral that will be submitted as well. She feels he physically needs to be able to do more since she physically cannot. SW encouraged her to give him a chance and there will be more updates next week.   Graeme Jude, MSW, LCSW Office: [phone] Cell: [phone] Fax: [phone]

## 2024-02-09 ENCOUNTER — Inpatient Hospital Stay (HOSPITAL_COMMUNITY)

## 2024-02-09 ENCOUNTER — Encounter (HOSPITAL_COMMUNITY): Payer: Self-pay

## 2024-02-09 MED ORDER — OXYCODONE HCL 5 MG PO TABS
5.0000 mg | ORAL_TABLET | Freq: Four times a day (QID) | ORAL | Status: DC | PRN
  Administered 2024-02-09 – 2024-02-10 (×4): 10 mg via ORAL
  Administered 2024-02-11 (×2): 5 mg via ORAL
  Administered 2024-02-12 – 2024-02-22 (×12): 10 mg via ORAL
  Administered 2024-02-23: 5 mg via ORAL
  Administered 2024-02-23 – 2024-02-25 (×4): 10 mg via ORAL
  Filled 2024-02-09 (×3): qty 2
  Filled 2024-02-09: qty 1
  Filled 2024-02-09 (×21): qty 2

## 2024-02-09 MED ORDER — VITAMIN D (ERGOCALCIFEROL) 1.25 MG (50000 UNIT) PO CAPS
50000.0000 [IU] | ORAL_CAPSULE | ORAL | Status: DC
  Administered 2024-02-09 – 2024-02-23 (×3): 50000 [IU] via ORAL
  Filled 2024-02-09 (×3): qty 1

## 2024-02-09 MED ORDER — TRAZODONE HCL 50 MG PO TABS: 50.0000 mg | ORAL_TABLET | Freq: Every evening | ORAL | Status: DC | PRN

## 2024-02-09 MED ORDER — GABAPENTIN 300 MG PO CAPS
600.0000 mg | ORAL_CAPSULE | Freq: Every day | ORAL | Status: DC
  Administered 2024-02-09 – 2024-02-17 (×9): 600 mg via ORAL
  Filled 2024-02-09 (×9): qty 2

## 2024-02-09 MED ORDER — GABAPENTIN 300 MG PO CAPS
300.0000 mg | ORAL_CAPSULE | Freq: Two times a day (BID) | ORAL | Status: DC
  Administered 2024-02-09 – 2024-02-18 (×19): 300 mg via ORAL
  Filled 2024-02-09 (×19): qty 1

## 2024-02-09 MED ORDER — FLEET ENEMA RE ENEM
1.0000 | ENEMA | Freq: Once | RECTAL | Status: AC
  Administered 2024-02-09: 1 via RECTAL
  Filled 2024-02-09: qty 1

## 2024-02-09 NOTE — Progress Notes (Signed)
 Physical Therapy Weekly Progress Note  Patient Details  Name: Aaron Herrera MRN: [id number] Date of Birth: 01/17/2001  Beginning of progress report period: February 02, 2024 End of progress report period: February 09, 2024  Today's Date: 02/09/2024 PT Individual Time: 0805-0900 +[id number] PT Individual Time Calculation (min): 55 min + 72 min  Patient has met 1 of 3 short term goals.  Pt is making excellent progress towards functional goals. Pt has been limited in the past week by vitals fluctuations limiting his OOB tolerance as well as pain in BLEs however both have been steadily improving in recent days. Pt completes bed mobility with max assist x1 for BLE management and repositioning, transfers via slideboard with min/mod assist x2 for safety bed<>WC, and self propelling WC with BUEs 180' with close supervision. Education has been ongoing with pt on recovery time and pain with pt demonstrating understanding and improving motivation to participate.  Patient continues to demonstrate the following deficits muscle weakness and muscle joint tightness, decreased cardiorespiratoy endurance, and decreased sitting balance, decreased standing balance, decreased postural control, decreased balance strategies, and difficulty maintaining precautions and therefore will continue to benefit from skilled PT intervention to increase functional independence with mobility.  Patient progressing toward long term goals..  Continue plan of care.  PT Short Term Goals Week 1:  PT Short Term Goal 1 (Week 1): Pt will complete bed mobility with mod assist PT Short Term Goal 1 - Progress (Week 1): Progressing toward goal PT Short Term Goal 2 (Week 1): Pt will complete transfers with LRAD with mod assist PT Short Term Goal 2 - Progress (Week 1): Progressing toward goal PT Short Term Goal 3 (Week 1): Pt will tolerate OOB 2 hours outside of therapies PT Short Term Goal 3 - Progress (Week 1): Met Week 2:  PT Short Term Goal  1 (Week 2): Pt will complete bed mobility with mod assist consistently PT Short Term Goal 2 (Week 2): Pt will complete transfers with min assist and LRAD PT Short Term Goal 3 (Week 2): Pt will complete WC mobility 150' modI PT Short Term Goal 4 (Week 2): Pt will complete sit to stand maintaining LLE NWB with max assist x2  Skilled Therapeutic Interventions/Progress Updates:  SESSION 1: Pt presents in room in bed, agreeable to PT. Pt denies pain at rest but does report pain with BLEs in dependent position. Session focused on transfer training and WC mobility training to improve upright tolerance and independence with mobility. Pt completes bed mobility slowly with max assist for BLE management and trunk to upright. Pt requires increased time to scoot towards EOB with assistance and acclimate to upright. Abdominal binder donned with pt seated EOB for hemodynamic support with max assist. Pt then completes slideboard transfer to left with mod assist x2 for scooting over board, 2nd person assist with placing board as therapist manages BLE position and assist with lateral hip movement during transfer. Pt requires max assist for scooting posteriorly in [facility] for proper sitting and requires additional time for positioning BLEs with leg rests propped up to decrease pain and improve upright tolerance. Pt then transported to day room and pt completes WC propulsion with BUEs 180' slow propulsion speed and pt reporting BUE fatigue but denies pain. Pt requires extended seated rest break, then pt completes WC mobility around 3 cones spaced 4' apart forward x3 trials and backwards x2 trials. Pt returns to room and remains seated in [facility] with all needs within reach, cal light in place  and chair alarm donned and activated at end of session.   SESSION 2: Pt presents in room in bed, agreeable to PT. Pt denies pain at rest but states he feels pain during transfer and with BLEs in dependent position. Session focused on transfer  training and BLE strengthening needed for transfers and bed mobility. Pt completes bed mobility with max assist for BUE management and trunk to upright, cues for sequencing. Pt takes increased time to acclimate to vertical as well as scoot towards EOB, therapist adjusting BLE positioning for comfort. Pt completes slideboard transfer with min assist x2 for placing slideboard and managing BLEs during transfer as well as min assist for final scoot into WC and removing slideboard. Increased time required for setting up elevating leg rests to assist with pain and ROM restrictions. Pt transported to day room then completes slideboard transfer with min assist x1! To EOM, increased time to complete with pt able to manage BLE movement during transfer with very little assist from therapist. Pt requires increased time for rest break seated EOM. Pt then agreeable to trial sit to stand to RW from max elevated EOM to assist with transfers to elevated surfaces such as car transfer. Two people positioned on either side of pt to assist with lift and one person positioned in front to maintain LLE NWB. Pt unable to achieve gluteal clearance due to pain in R knee with weightbearing. Pt returns to sitting and mat lowered to starting position. Pt takes rest break while therapist readjusts knee immobilizer and R CAM boot to ensure better fit. Pt then completes BLE therex sitting EOM to promote strengthening needed for bed mobility and transfers including: - RLE SLR x10 - LLE LAQ x10 - LLE hip flexion x10 Pt then educated on injuries provided with visual using xrays taken today to provide pain education and reiterate importance of precautions with pt verbalizing understanding and thankful for education. Pt completes slideboard transfer back to [facility] with min assist x1, pt able to manage BLEs with min assist and min assist for final scoot into WC. Pt transported back to room and completes slideboard transfer back to bed, provided with cues  for sequencing, min assist. Pt completes sit to supine with max assist for BLE management and repositioning in supine. Pt remains semi reclined with pillows positioned for comfort, all needs within reach, call light in place ,urinal in place, at end of session.   Community reintegration;Neuromuscular re-education;DME/adaptive equipment instruction;Psychosocial support;UE/LE Strength taining/ROM;Wheelchair propulsion/positioning;Balance/vestibular training;Discharge planning;Pain management;Skin care/wound management;Therapeutic Activities;UE/LE Coordination activities;Cognitive remediation/compensation;Functional mobility training;Patient/family education;Splinting/orthotics;Therapeutic Exercise    Therapy Documentation Precautions:  Precautions Precautions: Fall Precaution/Restrictions Comments: watch BP Required Braces or Orthoses: Knee Immobilizer - Right, Other Brace Knee Immobilizer - Right: On at all times Other Brace: R CAM boot, L post op shoe, L RNP splint, L wrist cock up splint Restrictions Weight Bearing Restrictions Per Provider Order: Yes RUE Weight Bearing Per Provider Order: Weight bearing as tolerated LUE Weight Bearing Per Provider Order: Weight bearing as tolerated RLE Weight Bearing Per Provider Order: Weight bearing as tolerated LLE Weight Bearing Per Provider Order: Non weight bearing Other Position/Activity Restrictions: no ROM of R knee, NO ROM restrictions L knee, no shoulder ROM restrictions   Therapy/Group: Individual Therapy  Reche Ohara PT, DPT 02/09/2024, 4:22 PM

## 2024-02-09 NOTE — Progress Notes (Signed)
 Occupational Therapy Session Note  Patient Details  Name: Aaron Herrera MRN: [id number] Date of Birth: [birth year]  Today's Date: 02/09/2024 OT Individual Time: 9064-9054 OT Individual Time Calculation (min): 10 min  and Today's Date: 02/09/2024 OT Missed Time: 65 Minutes Missed Time Reason: X-Ray  Today's Date: 02/09/2024 OT Individual Time: 1120-1230 OT Individual Time Calculation (min): 70 min   Short Term Goals: Week 2:  OT Short Term Goal 1 (Week 2): Pt will perform toilet transfer with Mod A (x1). OT Short Term Goal 2 (Week 2): Pt will thread LB garments with Mod A + AE. OT Short Term Goal 3 (Week 2): Pt will roll laterally in bed with Mod A (x1) to decrease burden of care.  Skilled Therapeutic Interventions/Progress Updates:   Session 1: Pt greeted sitting in standard WC, un-rated pain in BLE, rest/positioning provided as needed. Pt performs slide-board transfer from WC>EOB with + 2 for safety and overall Min A! Return to supine with Max A for pain management and time conservation. Pt misses ~65 mins of skilled intervention due to being transported out of unit for x-ray. Plan to make up missed minutes as schedule permits and patient appropriate.   Session 2:  OT returns to see patient and make up missed minutes. Session with focus on functional transfers. Pt comes to EOB with Max A for BLE management and trunk elevation towards L-side of bed. Slide-board transfer from EOB<>bariatric DABSC with assistance for placement and scooting across surface. Up to Mod A provided for return to EOB due to fatigue. Pt provided with increased time to attempt BM void, Max A (x2) provided for 3/3 toileting tasks (upon return to supine), no success with BM. X-rays completed during session, OT assists with positioning and brace management. Pt remained resting in bed with all immediate needs met, call bell within reach, and setup for lunch.   Therapy Documentation Precautions:   Precautions Precautions: Fall Precaution/Restrictions Comments: watch BP Required Braces or Orthoses: Knee Immobilizer - Right, Other Brace Knee Immobilizer - Right: On at all times Other Brace: R CAM boot, L post op shoe, L RNP splint, L wrist cock up splint Restrictions Weight Bearing Restrictions Per Provider Order: Yes RUE Weight Bearing Per Provider Order: Weight bearing as tolerated LUE Weight Bearing Per Provider Order: Weight bearing as tolerated RLE Weight Bearing Per Provider Order: Weight bearing as tolerated LLE Weight Bearing Per Provider Order: Non weight bearing Other Position/Activity Restrictions: no ROM of R knee, NO ROM restrictions L knee, no shoulder ROM restrictions   Therapy/Group: Individual Therapy  Nereida Habermann, OTR/L, MSOT  02/09/2024, 5:34 AM

## 2024-02-09 NOTE — Progress Notes (Signed)
 Patient ID: Aaron Herrera, male   DOB: 03/18/2000, 24 y.o.   MRN: [id number]   SW faxed PCS evaluation to [facility] [place] [facility] Dept at [phone].   Graeme Jude, MSW, LCSW Office: [phone] Cell: [phone] Fax: [phone]
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
## 2024-02-09 NOTE — Progress Notes (Signed)
 "                                                        PROGRESS NOTE   Subjective/Complaints:   Did better with orthostasis and attempted transfers yesterday. Still using oxycodone  at 4 am consistently.  Stating that sleep is interrupted by pain, overall pain control doing a little better. Vitals looking much better  Thanks to have a bowel movement today, but going to try to transfer to toileting chair rather than bedpan.  ROS: Patient denies fever, rash, sore throat, blurred vision, dizziness, nausea, vomiting, diarrhea, cough, shortness of breath or chest pain,   headache, or mood change.  + Bilateral knee pain + Left inner thigh pain + Anxiety  Objective:   No results found.  Recent Labs    02/07/24 0606  WBC 6.5  HGB 9.5*  HCT 29.2*  PLT 493*    Recent Labs    02/07/24 0606  NA 139  K 4.2  CL 103  CO2 23  GLUCOSE 93  BUN 21*  CREATININE 0.83  CALCIUM  9.5     Intake/Output Summary (Last 24 hours) at 02/09/2024 0756 Last data filed at 02/09/2024 0744 Gross per 24 hour  Intake 780 ml  Output 2200 ml  Net -1420 ml        Physical Exam: Vital Signs Blood pressure 122/68, pulse 87, temperature 98.7 F (37.1 C), temperature source Oral, resp. rate 19, height 5' 11 (1.803 m), weight 104.5 kg, SpO2 98%.  Constitutional: No distress . Vital signs reviewed.  Sitting upright in therapy gym. HEENT: NCAT, EOMI, oral membranes moist Neck: supple Cardiovascular: RRR without murmur. No JVD    Respiratory/Chest: CTA Bilaterally without wheezes or rales. Normal effort    GI/Abdomen: BS +, non-tender, non-distended Ext: no clubbing, cyanosis, or edema Psych: pleasant, a little flat but cooperative  Neurologic Exam:    Sensory exam: revealed normal sensation in all dermatomal regions in bilateral lower extremities, right upper extremity, and with reduced sensation to light touch in L dorsal thumb  Motor exam: strength 5/5 throughout right upper extremity and with  exception of LUE 3/5 FF, 0/5 FA, 0/5 WE, 4/5 EF, 3/5 EE, 4/5 SA--stable Antigravity all 4 extremities, limited by bracing and pain   MSK: Bl LE fractures; RLE wrapped in soft brace at the thigh and hard brace at the ankle   Skin:incisions intact--some staples remain in the left proximal thigh and right knee--ongoing 1-14  Physical exam unchanged from the above on reexamination 02/09/2024   Assessment/Plan: 1. Functional deficits which require 3+ hours per day of interdisciplinary therapy in a comprehensive inpatient rehab setting. Physiatrist is providing close team supervision and 24 hour management of active medical problems listed below. Physiatrist and rehab team continue to assess barriers to discharge/monitor patient progress toward functional and medical goals  Care Tool:  Bathing    Body parts bathed by patient: Left arm, Chest, Abdomen, Front perineal area, Right upper leg, Left upper leg   Body parts bathed by helper: Right arm, Buttocks, Left lower leg Body parts n/a: Right lower leg   Bathing assist Assist Level: Moderate Assistance - Patient 50 - 74%     Upper Body Dressing/Undressing Upper body dressing   What is the patient wearing?: Pull over shirt    Upper body  assist Assist Level: Minimal Assistance - Patient > 75%    Lower Body Dressing/Undressing Lower body dressing      What is the patient wearing?: Pants     Lower body assist Assist for lower body dressing: Total Assistance - Patient < 25%     Toileting Toileting    Toileting assist Assist for toileting: 2 Helpers     Transfers Chair/bed transfer  Transfers assist  Chair/bed transfer activity did not occur: Safety/medical concerns (orthostatic hypotension)  Chair/bed transfer assist level: Total Assistance - Patient < 25%     Locomotion Ambulation   Ambulation assist   Ambulation activity did not occur: Safety/medical concerns          Walk 10 feet activity   Assist  Walk  10 feet activity did not occur: Safety/medical concerns        Walk 50 feet activity   Assist Walk 50 feet with 2 turns activity did not occur: Safety/medical concerns         Walk 150 feet activity   Assist Walk 150 feet activity did not occur: Safety/medical concerns         Walk 10 feet on uneven surface  activity   Assist Walk 10 feet on uneven surfaces activity did not occur: Safety/medical concerns         Wheelchair     Assist Is the patient using a wheelchair?: Yes Type of Wheelchair: Manual Wheelchair activity did not occur: Safety/medical concerns         Wheelchair 50 feet with 2 turns activity    Assist    Wheelchair 50 feet with 2 turns activity did not occur: Safety/medical concerns       Wheelchair 150 feet activity     Assist  Wheelchair 150 feet activity did not occur: Safety/medical concerns       Blood pressure 122/68, pulse 87, temperature 98.7 F (37.1 C), temperature source Oral, resp. rate 19, height 5' 11 (1.803 m), weight 104.5 kg, SpO2 98%.  Medical Problem List and Plan: 1. Functional deficits secondary to polytrauma             -patient may shower with surgical dressings covered             -ELOS/Goals: 12-16 days, Mod A PT/OT at [facility] level - 02/23/24 -Continue CIR therapies including PT, OT,SLP    Weight bearing: WBAT B/L upper extremities. May weight bear on right leg for transfers in boot and knee immobilizer NWB Left leg and should wear post op shoe on left foot when transferring    Splinting: L radial nerve palsy splint (finger bands) during daytime hours PRN for exercise and functional tasks; SLEEP in black wrist cock-up splint and wear PRN during the day when not wearing other splint to prevent wrist drop.    1/13: DC home with parents, mom primary caregiver and concerned that she can;t Do much; dad works during daytime. Max A bed mobility very limited by pain and anxiety - up to EOB yesterday - trying  slideboard transfer today.  Min A UB, Max A LB care. Downgrading goals to Min A. SLP mild cognitive deficits  - SPV to ind with memory as biuggest deficit.    2.  Antithrombotics: -DVT/anticoagulation:  Mechanical: Sequential compression devices, below knee Bilateral lower extremities Pharmaceutical: Lovenox              -antiplatelet therapy: n/a             -BL LE  duplex dopplers negative   3. Pain Management: Tylenol  1000 mg q6h, gabapentin  300 mg 3 times daily.  Oxycodone  10 mg q4h and  Robaxin  500 mg q6 PRN.   1/8: Reduce tylenol  to 1000 mg TID, schedule robaxin  500 mg QID, schedule oxycodone  5 mg QID and reduce PRN to 5 mg Q6H PRN  1/11 still chasing pain at times. Pain wakes him up overnight   -will change scheduled oxycodone  5 q6 to oxycontin , titrate that if needed.   - 1-12: Monitor on OxyContin   - 1-13: Increase Robaxin  to 1000 mg every 6 hours scheduled, add tizanidine  2 mg every 6 hours as needed, and encouraged use of as needed oxycodone  prior to therapies  - 1/14: Increase PRN oxycodone  to 5-10 mg for moderate to severe pain; increase at bedtime gabapentin  to 600 mg to assist in at bedtime control  4. Mood/Behavior/Sleep: LCSW to follow for evaluation and support when available.              -anxiety: d/t poor sleep, worry, and claustrophobia; denies nightmares or re-living traumatic event. Continue hydroxyzine  at bedtime.              -sleep: melatonin 5 mg at bedtime.   - 1-8: Not using as needed Atarax , continue nightly scheduled and DC 4 times daily as needed.  If anxiety ongoing limiting factor, will discuss initiation of BuSpar .  1/9-10: Started BuSpar  7.5 mg twice daily.  Encouraged use of as needed Atarax  at night if needed--monitor this weekend.  1/11- some improvement but will increase to 10mg  bid as anxiety still a factor --monitor 1 to 2 days on increased regimen 1-13: Patient endorses this is generally well-controlled, major triggers using bedpan but otherwise doing  better 1/14: Sleep interruptions due to pain, melatonin not effective, change to PRN trazodone  50 mg at bedtime. Consider adding elavil  at bedtime if gabapentin  increase not effective.    5. Neuropsych/cognition: Psych consult ordered 1/6. Neuropsych Consult ordered in CIR.  This patient is capable of making decisions on his own behalf.   6. Skin/Wound Care: Routine pressure relief measures.               - 1 week post-op; will reach out to orthopedic surgical teams regarding timeline for suture/staple removal, RLE wrapping   -- Per Dr. Reyne, okay to remove sutures/staples. Ordered 1-8 - 1/9: Per Lauraine Moores, left humerus and right tibial incisions can be left open to air.  Per Dr. Josefina, right ankle dressing to remain intact unless saturated.   1-13: Remove remaining staples in the left proximal thigh and right knee--reminded nursing on 1-14 1-14: Ortho evaluating today, reached out to Lauraine Moores to clarify orders on right ankle and when we are able to remove dressings.  7. Fluids/Electrolytes/Nutrition: Monitor strict I&O and weights. Follow up labs CBC/CMP               -Regular diet + Ensure and vitamin supplements              - Good p.o. intakes overall   8.  Splenic laceration: 2 cm, monitor hemoglobin with CBC.   9.  Possible liver laceration: Small left zone 2/3 retroperitoneal hematoma.  Same as above   10.  ABLA: S/p 4 units PRBC, 1 unit whole blood, 2 unit FFP on 12/30.  Hemoglobin 8.9 from 8.8.  PLTs improved now to 63.               - 1-7: Stable  on admission labs, follow-up labs   11.  AKI: Related to hypovolemia on admission, resolved.  Encourage oral hydration. Monitor BMP               - 1/7: BUN/creatinine stable; monitor, stable at 1-7   - 1-12: A.m. labs stable, continue to encourage p.o. fluids, blood pressure holding up better  12.  Tachycardia/hypertension: Continue Lopressor  25 mg twice daily.               - 1/7: Hypotensive with therapies + tachycardic  - 500 cc IVF bolus today--responsive   - 1/8: Remains tachycardic 120s; titrate pain regimen as above; repeat orthostats and add 75 cc/hr drip if +; encourage PO fluids - may need to increase lopressor   1/11 HR a little better this morning (105) -continue lopressor  at 50mg  bid to see if this better controls without dropping bp too much -rx pain and anxiety as above 1/12-14: Improving with pain control, monitor.       02/02/2024    7:34 AM 02/02/2024    4:19 AM 02/02/2024   12:37 AM  Vitals with BMI  Systolic 154 151 863  Diastolic 81 92 78  Pulse 117 115 109    13.  Foley: TOV failed 1/1 & 1/5. Foley replaced on 1/6 - Continue Flomax .  1/7 Flomax  moved to at bedtime due to orthostasis.  1/8: + BM yesterday; will plan foley trial Monday 1/12 --order 1/13-14: Continues with high volume output but has been continent of all voids, continue current regimen  14.  Constipation: received mag citrate 1/6 LBM unsure of success. KUB ordered.              -Miralax  increased to BID. Colace BID, as needed enema.              1/7: Straining with orthostasis - give bowel prep  1/8: medium BM with suppository and prep - schedule miralax  17 g BID, sennakot S 1 tab BID; encourage PO fluids as above  1-9: Increase Senokot to 2 tabs twice daily   - Multiple large BMs 1-11.   1-13: Patient endorses trouble using bedpan as huge source of anxiety, encouraged to practice transfers to assist in changing this. 1/14: Monitor through today; may need sorbitol if no BM tonight  15. Right 4 through 7, left 9 through 10 rib fractures w/pulmonary contusions: Continue pain control and pulmonary toilet.             - Encourage incentive spirometer and flutter valve. 16. L2-5 transverse process fractures - Pain control. 17. Right scapular spine fracture - Dr. Reyne consulted. This to be treated nonoperatively.  18. Right femur fracture - s/p IM rod 12/30 Dr. Reyne. 19. Open right tib/fib fracture - s/p IM rod right  tibia 12/30 Dr. Reyne, ORIF R medial malleolus, Right partial patellectomy Dr. Josefina. 20. Left humeral fracture - ORIF 12/31 Dr. Kendal. 21. Left femur fracture - s/p IM rod 12/30 Dr. Josefina. 22. Left foot fractures - s/p closed reduction left toes 12/30. 23. Left tibial plateau fracture -  washed out 12/30 Dr. Reyne, ORIF 12/31 Hr. Haddix.  24. Obesity. Body mass index is 32.13 kg/m. Complicates all aspects of care.   LOS: 8 days A FACE TO FACE EVALUATION WAS PERFORMED  Joesph JAYSON Likes 02/09/2024, 7:56 AM     "

## 2024-02-10 DIAGNOSIS — T07XXXA Unspecified multiple injuries, initial encounter: Secondary | ICD-10-CM | POA: Diagnosis not present

## 2024-02-10 LAB — CBC
HCT: 31.1 % — ABNORMAL LOW (ref 39.0–52.0)
Hemoglobin: 9.7 g/dL — ABNORMAL LOW (ref 13.0–17.0)
MCH: 29.9 pg (ref 26.0–34.0)
MCHC: 31.2 g/dL (ref 30.0–36.0)
MCV: 96 fL (ref 80.0–100.0)
Platelets: 492 K/uL — ABNORMAL HIGH (ref 150–400)
RBC: 3.24 MIL/uL — ABNORMAL LOW (ref 4.22–5.81)
RDW: 16.3 % — ABNORMAL HIGH (ref 11.5–15.5)
WBC: 4.3 K/uL (ref 4.0–10.5)
nRBC: 0 % (ref 0.0–0.2)

## 2024-02-10 MED ORDER — LACTULOSE 10 GM/15ML PO SOLN: 10.0000 g | Freq: Every day | ORAL | Status: DC | PRN

## 2024-02-10 MED ORDER — SENNOSIDES-DOCUSATE SODIUM 8.6-50 MG PO TABS
3.0000 | ORAL_TABLET | Freq: Two times a day (BID) | ORAL | Status: DC
  Administered 2024-02-10 – 2024-02-13 (×7): 3 via ORAL
  Filled 2024-02-10 (×7): qty 3

## 2024-02-10 MED ORDER — AMITRIPTYLINE HCL 25 MG PO TABS
25.0000 mg | ORAL_TABLET | Freq: Every day | ORAL | Status: DC
  Administered 2024-02-10 – 2024-02-24 (×15): 25 mg via ORAL
  Filled 2024-02-10 (×15): qty 1

## 2024-02-10 NOTE — Plan of Care (Signed)
" °  Problem: Consults Goal: RH GENERAL PATIENT EDUCATION Description: See Patient Education module for education specifics. Outcome: Progressing Goal: Skin Care Protocol Initiated - if Braden Score 18 or less Description: If consults are not indicated, leave blank or document N/A Outcome: Progressing Goal: Nutrition Consult-if indicated Outcome: Progressing   Problem: RH BOWEL ELIMINATION Goal: RH STG MANAGE BOWEL WITH ASSISTANCE Description: STG Manage Bowel with mod I Assistance. Outcome: Progressing Goal: RH STG MANAGE BOWEL W/MEDICATION W/ASSISTANCE Description: STG Manage Bowel with Medication with mod I Assistance. Outcome: Progressing   Problem: RH BLADDER ELIMINATION Goal: RH STG MANAGE BLADDER WITH ASSISTANCE Description: STG Manage Bladder With min  Assistance Outcome: Progressing Goal: RH STG MANAGE BLADDER WITH MEDICATION WITH ASSISTANCE Description: STG Manage Bladder With Medication With mod I Assistance. Outcome: Progressing Goal: RH STG MANAGE BLADDER WITH EQUIPMENT WITH ASSISTANCE Description: STG Manage Bladder With Equipment With min Assistance Outcome: Progressing   Problem: RH SKIN INTEGRITY Goal: RH STG SKIN FREE OF INFECTION/BREAKDOWN Description: Manage skin without infection with min assist Outcome: Progressing   Problem: RH SAFETY Goal: RH STG ADHERE TO SAFETY PRECAUTIONS W/ASSISTANCE/DEVICE Description: STG Adhere to Safety Precautions With cues Assistance/Device. Outcome: Progressing   Problem: RH PAIN MANAGEMENT Goal: RH STG PAIN MANAGED AT OR BELOW PT'S PAIN GOAL Description: Pain < 4 with prns Outcome: Progressing   Problem: RH KNOWLEDGE DEFICIT GENERAL Goal: RH STG INCREASE KNOWLEDGE OF SELF CARE AFTER HOSPITALIZATION Description: Patient and parents will be able to manage care using educational resources for medications, skin care/ foley care and dietary modification independently Outcome: Progressing   "

## 2024-02-10 NOTE — Progress Notes (Signed)
 "                                                        PROGRESS NOTE   Subjective/Complaints:   No events overnight.  A.m. CBC looks stable. Single instance of tachycardia this morning into the 120s, otherwise vitals are looking good Pain overnight remains 6-7 out of 10, did  need oxycodone  at 3 AM again.  States pain control overnight was slightly better with increased gabapentin , but still interrupting his sleep.  Primarily burning pain.  Large bowel movement yesterday after enema, unable to do on bedside commode.  ROS: Patient denies fever, rash, sore throat, blurred vision, dizziness, nausea, vomiting, diarrhea, cough, shortness of breath or chest pain,   headache, or mood change.  + Bilateral knee pain + Left inner thigh pain + Anxiety + Constipation  Objective:   DG Knee 1-2 Views Left Result Date: 02/09/2024 CLINICAL DATA:  Fracture follow-up. EXAM: DG KNEE 1-2V*L* COMPARISON:  01/26/2024 FINDINGS: Medial plate and screw fixation of comminuted proximal tibial fracture. Fracture is unchanged alignment with mild persistent articular offset. Femoral intramedullary nail with distal locking screw fixation traverses midshaft femur fracture. Persistent lateral displacement of a butterfly fracture fragment. Some peripheral callus formation of the femoral fracture. Overlying skin staples persist. Small joint effusion persists. Improving soft tissue edema. IMPRESSION: 1. ORIF of comminuted proximal tibial fracture, unchanged in alignment. 2. ORIF of midshaft femur fracture, unchanged in alignment. Electronically Signed   By: Andrea Gasman M.D.   On: 02/09/2024 13:28   DG Ankle Complete Right Result Date: 02/09/2024 CLINICAL DATA:  Fracture follow-up. EXAM: RIGHT ANKLE - COMPLETE 3+ VIEW; RIGHT TIBIA AND FIBULA - 2 VIEW COMPARISON:  01/25/2024 FINDINGS: Ankle: Screw traverses medial malleolar fracture. Fracture is unchanged in alignment with some internal callus formation. The ankle mortise  is preserved. Medial skin staples remain in place. Tibia/fibula: Tibial intramedullary nail with proximal and distal locking screw fixation traversing comminuted mid tibial fracture. Stable fracture alignment from prior exam. Faint early peripheral callus formation. Persistent displacement of a butterfly fragment laterally. Unchanged alignment of fibular shaft fracture without significant bridging callus. Skin staples persist proximally. IMPRESSION: 1. Unchanged alignment of tibial and fibular fractures post ORIF. Faint early peripheral callus formation. 2. Unchanged alignment of medial malleolar fracture post ORIF. Electronically Signed   By: Andrea Gasman M.D.   On: 02/09/2024 13:26   DG Tibia/Fibula Right Result Date: 02/09/2024 CLINICAL DATA:  Fracture follow-up. EXAM: RIGHT ANKLE - COMPLETE 3+ VIEW; RIGHT TIBIA AND FIBULA - 2 VIEW COMPARISON:  01/25/2024 FINDINGS: Ankle: Screw traverses medial malleolar fracture. Fracture is unchanged in alignment with some internal callus formation. The ankle mortise is preserved. Medial skin staples remain in place. Tibia/fibula: Tibial intramedullary nail with proximal and distal locking screw fixation traversing comminuted mid tibial fracture. Stable fracture alignment from prior exam. Faint early peripheral callus formation. Persistent displacement of a butterfly fragment laterally. Unchanged alignment of fibular shaft fracture without significant bridging callus. Skin staples persist proximally. IMPRESSION: 1. Unchanged alignment of tibial and fibular fractures post ORIF. Faint early peripheral callus formation. 2. Unchanged alignment of medial malleolar fracture post ORIF. Electronically Signed   By: Andrea Gasman M.D.   On: 02/09/2024 13:26   DG Humerus Left Result Date: 02/09/2024 CLINICAL DATA:  Fracture follow-up. EXAM: DG HUMERUS  2V *L* COMPARISON:  01/26/2024 FINDINGS: Plate and screw fixation of mid humerus fracture. Fracture is unchanged and near  anatomic in alignment. No significant callus formation or bony bridging. No new fracture. Diminishing postoperative soft tissue edema. IMPRESSION: Plate and screw fixation of mid humerus fracture unchanged in alignment. Electronically Signed   By: Andrea Gasman M.D.   On: 02/09/2024 13:23    Recent Labs    02/10/24 0459  WBC 4.3  HGB 9.7*  HCT 31.1*  PLT 492*    No results for input(s): NA, K, CL, CO2, GLUCOSE, BUN, CREATININE, CALCIUM  in the last 72 hours.    Intake/Output Summary (Last 24 hours) at 02/10/2024 1013 Last data filed at 02/10/2024 1000 Gross per 24 hour  Intake 500 ml  Output 1250 ml  Net -750 ml        Physical Exam: Vital Signs Blood pressure 127/82, pulse 94, temperature 98.4 F (36.9 C), temperature source Oral, resp. rate 20, height 5' 11 (1.803 m), weight 104.5 kg, SpO2 96%.  Constitutional: No distress . Vital signs reviewed.  Working on a cycle in therapy gym. HEENT: NCAT, EOMI, oral membranes moist Neck: supple Cardiovascular: RRR without murmur. No JVD  .  No tachycardia! Respiratory/Chest: CTA Bilaterally without wheezes or rales. Normal effort    GI/Abdomen: BS +, non-tender, non-distended Ext: no clubbing, cyanosis, or edema--right lower extremity covered in boot. Psych: pleasant, cooperative.  More positive than prior exams.  Neurologic Exam:    Sensory exam: revealed normal sensation in all dermatomal regions in bilateral lower extremities, right upper extremity, and with reduced sensation to light touch in L dorsal thumb  Motor exam: strength 5/5 throughout right upper extremity and with exception of LUE 3/5 FF, 0/5 FA, 0/5 WE, 4/5 EF, 3/5 EE, 4/5 SA--stable Antigravity against resistance bilateral lower extremities, approximately 4-5   MSK:  RLE in boot   Skin: Incisions clean, dry, intact   Assessment/Plan: 1. Functional deficits which require 3+ hours per day of interdisciplinary therapy in a comprehensive  inpatient rehab setting. Physiatrist is providing close team supervision and 24 hour management of active medical problems listed below. Physiatrist and rehab team continue to assess barriers to discharge/monitor patient progress toward functional and medical goals  Care Tool:  Bathing    Body parts bathed by patient: Left arm, Chest, Abdomen, Front perineal area, Right upper leg, Left upper leg   Body parts bathed by helper: Right arm, Buttocks, Left lower leg Body parts n/a: Right lower leg   Bathing assist Assist Level: Moderate Assistance - Patient 50 - 74%     Upper Body Dressing/Undressing Upper body dressing   What is the patient wearing?: Pull over shirt    Upper body assist Assist Level: Minimal Assistance - Patient > 75%    Lower Body Dressing/Undressing Lower body dressing      What is the patient wearing?: Pants     Lower body assist Assist for lower body dressing: Total Assistance - Patient < 25%     Toileting Toileting    Toileting assist Assist for toileting: 2 Helpers     Transfers Chair/bed transfer  Transfers assist  Chair/bed transfer activity did not occur: Safety/medical concerns (orthostatic hypotension)  Chair/bed transfer assist level: Total Assistance - Patient < 25%     Locomotion Ambulation   Ambulation assist   Ambulation activity did not occur: Safety/medical concerns          Walk 10 feet activity   Assist  Walk 10  feet activity did not occur: Safety/medical concerns        Walk 50 feet activity   Assist Walk 50 feet with 2 turns activity did not occur: Safety/medical concerns         Walk 150 feet activity   Assist Walk 150 feet activity did not occur: Safety/medical concerns         Walk 10 feet on uneven surface  activity   Assist Walk 10 feet on uneven surfaces activity did not occur: Safety/medical concerns         Wheelchair     Assist Is the patient using a wheelchair?: Yes Type  of Wheelchair: Manual Wheelchair activity did not occur: Safety/medical concerns         Wheelchair 50 feet with 2 turns activity    Assist    Wheelchair 50 feet with 2 turns activity did not occur: Safety/medical concerns       Wheelchair 150 feet activity     Assist  Wheelchair 150 feet activity did not occur: Safety/medical concerns       Blood pressure 127/82, pulse 94, temperature 98.4 F (36.9 C), temperature source Oral, resp. rate 20, height 5' 11 (1.803 m), weight 104.5 kg, SpO2 96%.  Medical Problem List and Plan: 1. Functional deficits secondary to polytrauma             -patient may shower with surgical dressings covered             -ELOS/Goals: 12-16 days, Mod A PT/OT at [facility] level - 02/23/24 -Continue CIR therapies including PT, OT,SLP    Weight bearing: WBAT B/L upper extremities. May weight bear on right leg for transfers in boot and knee immobilizer NWB Left leg and should wear post op shoe on left foot when transferring    Splinting: L radial nerve palsy splint (finger bands) during daytime hours PRN for exercise and functional tasks; SLEEP in black wrist cock-up splint and wear PRN during the day when not wearing other splint to prevent wrist drop.    1/13: DC home with parents, mom primary caregiver and concerned that she can;t Do much; dad works during daytime. Max A bed mobility very limited by pain and anxiety - up to EOB yesterday - trying slideboard transfer today.  Min A UB, Max A LB care. Downgrading goals to Min A. SLP mild cognitive deficits  - SPV to ind with memory as biuggest deficit.    1-15: Weightbearing status per orthopedics as below.    To follow-up with Dr. Kendal 2 weeks after DC for x-rays of the left humerus and left knee, and Dr.Gebauer/Landau for bilateral tibia left femur and left toe fractures   2.  Antithrombotics: -DVT/anticoagulation:  Mechanical: Sequential compression devices, below knee Bilateral lower  extremities Pharmaceutical: Lovenox              -antiplatelet therapy: n/a             -BL LE duplex dopplers negative   - 1-15 per Ortho, transition to Eliquis  2.5 mg twice daily for 30 days for DVT prophylaxis at discharge; will start transition to Eliquis  today for patient comfort   3. Pain Management: Tylenol  1000 mg q6h, gabapentin  300 mg 3 times daily.  Oxycodone  10 mg q4h and  Robaxin  500 mg q6 PRN.   1/8: Reduce tylenol  to 1000 mg TID, schedule robaxin  500 mg QID, schedule oxycodone  5 mg QID and reduce PRN to 5 mg Q6H PRN  1/11 still chasing pain  at times. Pain wakes him up overnight   -will change scheduled oxycodone  5 q6 to oxycontin , titrate that if needed.   - 1-12: Monitor on OxyContin   - 1-13: Increase Robaxin  to 1000 mg every 6 hours scheduled, add tizanidine  2 mg every 6 hours as needed, and encouraged use of as needed oxycodone  prior to therapies  - 1/14: Increase PRN oxycodone  to 5-10 mg for moderate to severe pain; increase at bedtime gabapentin  to 600 mg to assist in at bedtime control  1-15: Using mostly oxycodone  10 overnight, pain remains 6-7 and interrupting sleep.  Results with increase gabapentin .  We discussed the patient today adding Elavil  versus scheduling tizanidine --will add Elavil  25 mg nightly.  4. Mood/Behavior/Sleep: LCSW to follow for evaluation and support when available.              -anxiety: d/t poor sleep, worry, and claustrophobia; denies nightmares or re-living traumatic event. Continue hydroxyzine  at bedtime.              -sleep: melatonin 5 mg at bedtime.   - 1-8: Not using as needed Atarax , continue nightly scheduled and DC 4 times daily as needed.  If anxiety ongoing limiting factor, will discuss initiation of BuSpar .  1/9-10: Started BuSpar  7.5 mg twice daily.  Encouraged use of as needed Atarax  at night if needed--monitor this weekend.  1/11- some improvement but will increase to 10mg  bid as anxiety still a factor --monitor 1 to 2 days on  increased regimen 1-13: Patient endorses this is generally well-controlled, major triggers using bedpan but otherwise doing better 1/14: Sleep interruptions due to pain, melatonin not effective, change to PRN trazodone  50 mg at bedtime. Consider adding elavil  at bedtime if gabapentin  increase not effective.  1-15: Adding Elavil  25 mg nightly   5. Neuropsych/cognition: Psych consult ordered 1/6. Neuropsych Consult ordered in CIR.  This patient is capable of making decisions on his own behalf.   6. Skin/Wound Care: Routine pressure relief measures.               - 1 week post-op; will reach out to orthopedic surgical teams regarding timeline for suture/staple removal, RLE wrapping   -- Per Dr. Reyne, okay to remove sutures/staples. Ordered 1-8 - 1/9: Per Lauraine Moores, left humerus and right tibial incisions can be left open to air.  Per Dr. Josefina, right ankle dressing to remain intact unless saturated.   1-13: Remove remaining staples in the left proximal thigh and right knee--reminded nursing on 1-14 1-14: Ortho eval today:  LUE - ok to leave incision open to air LLE - ok to leave knee incision open to air RLE - Staples to be removed today and dressing change PRN   7. Fluids/Electrolytes/Nutrition: Monitor strict I&O and weights. Follow up labs CBC/CMP               -Regular diet + Ensure and vitamin supplements              - Good p.o. intakes overall   8.  Splenic laceration: 2 cm, monitor hemoglobin with CBC.   9.  Possible liver laceration: Small left zone 2/3 retroperitoneal hematoma.  Same as above   10.  ABLA: S/p 4 units PRBC, 1 unit whole blood, 2 unit FFP on 12/30.  Hemoglobin 8.9 from 8.8.  PLTs improved now to 63.               - 1-7: Stable on admission labs, follow-up labs   1-15: Hemoglobin  improving.  11.  AKI: Related to hypovolemia on admission, resolved.  Encourage oral hydration. Monitor BMP               - 1/7: BUN/creatinine stable; monitor, stable at 1-7   -  1-12: A.m. labs stable, continue to encourage p.o. fluids, blood pressure holding up better   - labs Qmonday  12.  Tachycardia/hypertension: Continue Lopressor  25 mg twice daily.               - 1/7: Hypotensive with therapies + tachycardic - 500 cc IVF bolus today--responsive   - 1/8: Remains tachycardic 120s; titrate pain regimen as above; repeat orthostats and add 75 cc/hr drip if +; encourage PO fluids - may need to increase lopressor   1/11 HR a little better this morning (105) -continue lopressor  at 50mg  bid to see if this better controls without dropping bp too much -rx pain and anxiety as above 1/12-15: Improving with pain control, monitor.       02/02/2024    7:34 AM 02/02/2024    4:19 AM 02/02/2024   12:37 AM  Vitals with BMI  Systolic 154 151 863  Diastolic 81 92 78  Pulse 117 115 109    13.  Foley: TOV failed 1/1 & 1/5. Foley replaced on 1/6 - Continue Flomax .  1/7 Flomax  moved to at bedtime due to orthostasis.  1/8: + BM yesterday; will plan foley trial Monday 1/12 --order 1/13-14: Continues with high volume output but has been continent of all voids, continue current regimen -1-15 urinary retention resolved, continent of all voids.  Will continue Flomax  for now given ongoing high-volume output and poor mobility.  14.  Constipation: received mag citrate 1/6 LBM unsure of success. KUB ordered.              -Miralax  increased to BID. Colace BID, as needed enema.              1/7: Straining with orthostasis - give bowel prep  1/8: medium BM with suppository and prep - schedule miralax  17 g BID, sennakot S 1 tab BID; encourage PO fluids as above  1-9: Increase Senokot to 2 tabs twice daily   - Multiple large BMs 1-11.   1-13: Patient endorses trouble using bedpan as huge source of anxiety, encouraged to practice transfers to assist in changing this. 1/14: Monitor through today; may need sorbitol if no BM tonight Last bowel movement 1-14, large.  Still requiring enema. 1-15:  Increase Senokot-S to 3 tabs twice daily, continue MiraLAX  17 g twice daily, add as needed lactulose  10 g for moderate constipation.  15. Right 4 through 7, left 9 through 10 rib fractures w/pulmonary contusions: Continue pain control and pulmonary toilet.             - Encourage incentive spirometer and flutter valve. 16. L2-5 transverse process fractures - Pain control. 17. Right scapular spine fracture - Dr. Reyne consulted. This to be treated nonoperatively.  18. Right femur fracture - s/p IM rod 12/30 Dr. Reyne.   - 1/14: RLE knee immobilizer, ok for hip ROM; WBAT for transfers only in boot and KI  19. Open right tib/fib fracture - s/p IM rod right tibia 12/30 Dr. Reyne, ORIF R medial malleolus, Right partial patellectomy Dr. Josefina.   - 1/14: WBAT for tranfers only in boot and KI  20. Left humeral fracture - ORIF 12/31 Dr. Kendal.   - 1/14: WBAT w/ radial nerve palsy splint  21. Left femur  fracture - s/p IM rod 12/30 Dr. Josefina. 22. Left foot fractures - s/p closed reduction left toes 12/30. 23. Left tibial plateau fracture -  washed out 12/30 Dr. Reyne, ORIF 12/31 Hr. Haddix.     - 1/14: LLE unrestricted ROM, NWB  24. Obesity. Body mass index is 32.13 kg/m. Complicates all aspects of care.   LOS: 9 days A FACE TO FACE EVALUATION WAS PERFORMED  Aaron Herrera Likes 02/10/2024, 10:13 AM     "

## 2024-02-10 NOTE — Progress Notes (Signed)
 Physical Therapy Session Note  Patient Details  Name: Aaron Herrera MRN: [id number] Date of Birth: [birth year]  Today's Date: 02/10/2024 PT Individual Time: 9191-9151 + 1050-1201 PT Individual Time Calculation (min): 40 min + 71 min  Short Term Goals: Week 2:  PT Short Term Goal 1 (Week 2): Pt will complete bed mobility with mod assist consistently PT Short Term Goal 2 (Week 2): Pt will complete transfers with min assist and LRAD PT Short Term Goal 3 (Week 2): Pt will complete WC mobility 150' modI PT Short Term Goal 4 (Week 2): Pt will complete sit to stand maintaining LLE NWB with max assist x2  Skilled Therapeutic Interventions/Progress Updates:    SESSION 1: Pt presents in room in bed, agreeable to PT. Pt does not report pain at rest, states had pain medication at 3am otherwise undmedicated. Pt RN notified during session and present at end of session with pain meds. Pt reports increased pain in L anterior ribs with mobility this session with rolling in bed and with pushing WC. Session focused on bed mobility, transfer training, and WC mobility training. Pt completes rolling bilaterally with max assist x2 due to increased pain for donning pants with max assist of 2 for managing over CAM boot and knee immobilizer on RLE. Pt completes supine to sit with max assist x2 for BLE management and trunk to upright. Pt completes slideboard transfer with min assist x1 for placing slideboard and managing BLEs during transfer (increased pain in R knee during transfer requiring increased time and frequent readjustments). Pt requires increased time for managing leg rests to improve comfort and fit. Pt then completes WC mobility 50' with BUEs with supervision but requests to stop due to pain in L rib. Pt transported to day room and remains seated in [facility], RN notified of pt pain and therapist attempt to fit new elevating leg rests for improved comfort however pt reporting increased comfort with prior leg rests. Pt  agreeable to remain sitting upright outside of therapy session until next PT session, returned to room and remains with pillows for comfort, leg rests elevated, all needs within reach, and RN present to provide pain medication at end of session.   SESSION 2: Pt presents in room in [facility], agreeable to PT. Pt reporting 4/10 pain, improved with pain medication at end of last session. Session focused on WC mobility training, therapeutic activities for  BUE endurance needed for transfers and WC mobility, transfer training, and bed mobility training for sit to supine and rolling in bed, and therapeutic exercise to promote improved BLE/core strength needed for transfers and bed mobility. Pt self propels WC ~100' with supervision before reporting fatigue, improved rib pain from this morning. Pt transported the rest of the way to ortho gym and set up on UBE propelling forward/retro 5 min each on L1. Pt then completes core/BLE exercises for strengthening and ROM including: - sit ups x10 6# med ball - RLE SLR x10 - LLE marches x10 - LLE LAQ x10 - hamstring stretch, pulses x10 BLE Pt transported back to room and completes slideboard transfer with min assist to R, increased time with frequent readjustments. Pt then completes sit to long sitting with mod assist for BLE management until bed, mod assist for readjusting in supine. Pt then completes reps rolling to R with mod cues for sequencing x5, mod assist, reps rolling to L with max assist. Pt remains semi reclined with all needs within reach, call light in place, pillows positioned for comfort  at end of sesison.    Therapy Documentation Precautions:  Precautions Precautions: Fall Precaution/Restrictions Comments: watch BP Required Braces or Orthoses: Knee Immobilizer - Right, Other Brace Knee Immobilizer - Right: On at all times Other Brace: R CAM boot, L post op shoe, L RNP splint, L wrist cock up splint Restrictions Weight Bearing Restrictions Per Provider  Order: Yes RUE Weight Bearing Per Provider Order: Weight bearing as tolerated LUE Weight Bearing Per Provider Order: Weight bearing as tolerated RLE Weight Bearing Per Provider Order: Weight bearing as tolerated LLE Weight Bearing Per Provider Order: Non weight bearing Other Position/Activity Restrictions: no ROM of R knee, NO ROM restrictions L knee, no shoulder ROM restrictions   Therapy/Group: Individual Therapy  Reche Ohara 02/10/2024, 10:48 AM

## 2024-02-10 NOTE — Progress Notes (Signed)
 Occupational Therapy Session Note  Patient Details  Name: Aaron Herrera MRN: [id number] Date of Birth: 2000-05-17  Today's Date: 02/10/2024 OT Individual Time: 1415-1530 OT Individual Time Calculation (min): 75 min    Short Term Goals: Week 1:  OT Short Term Goal 1 (Week 1): Pt will donn UB shirt with CGA for balance OT Short Term Goal 1 - Progress (Week 1): Met OT Short Term Goal 2 (Week 1): Pt will complete toilet transfer with max A OT Short Term Goal 2 - Progress (Week 1): Not met OT Short Term Goal 3 (Week 1): Pt will donn LUE radial nerve palsy splint on LUE with set up A OT Short Term Goal 3 - Progress (Week 1): Met Week 2:  OT Short Term Goal 1 (Week 2): Pt will perform toilet transfer with Mod A (x1). OT Short Term Goal 2 (Week 2): Pt will thread LB garments with Mod A + AE. OT Short Term Goal 3 (Week 2): Pt will roll laterally in bed with Mod A (x1) to decrease burden of care.  Skilled Therapeutic Interventions/Progress Updates:      Therapy Documentation Precautions:  Precautions Precautions: Fall Precaution/Restrictions Comments: watch BP Required Braces or Orthoses: Knee Immobilizer - Right, Other Brace Knee Immobilizer - Right: On at all times Other Brace: R CAM boot, L post op shoe, L RNP splint, L wrist cock up splint Restrictions Weight Bearing Restrictions Per Provider Order: Yes RUE Weight Bearing Per Provider Order: Weight bearing as tolerated LUE Weight Bearing Per Provider Order: Weight bearing as tolerated RLE Weight Bearing Per Provider Order: Weight bearing as tolerated LLE Weight Bearing Per Provider Order: Non weight bearing Other Position/Activity Restrictions: no ROM of R knee, NO ROM restrictions L knee, no shoulder ROM restrictions General: Pt supine in bed upon OT arrival, agreeable to OT session.  Pain:  3/10 pain reported in Rt thigh, activity, intermittent rest breaks, distractions provided for pain management, pt reports tolerable to  proceed.   ADL: OT providing skilled intervention on ADL retraining in order to increase independence with tasks and increase activity tolerance. Pt completed the following tasks at the current level of assist: Bed mobility: Mod A for BLE management, pt able to manage core with use of bed rail Transfers: SBA with lateral transfer board and good safety awareness, increased time to complete with small scoots, no VC required for precautions  Exercises: OT providing skilled intervention with managing BLE onto/off of bed. OT placing pt in W/C next to mat table and use of gait belt for make shift leg loop. Pt able to manage legs with leg loop with SBA for multiple trials with PRN rest breaks.   Other Treatments: Pt participated in individual BUE and BLE dance exercises in social setting including reaching out of BOS, BUE and BLE ROM in all planes while seated in W/C in order to increase generalized strength/endurance to complete ADLs such as functional transfers with increased independence. Pt no noted LOB/SOB while participating. Pt required no VC to participate and had good safety awareness of precautions.    Pt seated in W/C at end of session with call light within reach and 4Ps assessed.    Therapy/Group: Individual Therapy  Camie Hoe, OTD, OTR/L 02/10/2024, 3:42 PM

## 2024-02-11 DIAGNOSIS — T07XXXA Unspecified multiple injuries, initial encounter: Secondary | ICD-10-CM | POA: Diagnosis not present

## 2024-02-11 MED ORDER — APIXABAN 2.5 MG PO TABS
2.5000 mg | ORAL_TABLET | Freq: Two times a day (BID) | ORAL | Status: DC
  Administered 2024-02-12 – 2024-02-25 (×27): 2.5 mg via ORAL
  Filled 2024-02-11 (×27): qty 1

## 2024-02-11 NOTE — Progress Notes (Signed)
 Sutures and 9 staples removed from anterior lower leg and medial malleolus area of right foot.

## 2024-02-11 NOTE — Progress Notes (Signed)
 Patient ID: Aaron Herrera, male   DOB: 11/26/2000, 24 y.o.   MRN: [id number]  [facility] referral confirmation received.   Graeme Jude, MSW, LCSW Office: [phone] Cell: [phone] Fax: [phone]

## 2024-02-11 NOTE — Progress Notes (Signed)
 Physical Therapy Session Note  Patient Details  Name: Aaron Herrera MRN: [id number] Date of Birth: 2001-01-25  Today's Date: 02/11/2024 PT Individual Time: 9194-9169 + [id number] + 1420-1500 PT Individual Time Calculation (min): 25 min + 72 min + 40 min  Short Term Goals: Week 2:  PT Short Term Goal 1 (Week 2): Pt will complete bed mobility with mod assist consistently PT Short Term Goal 2 (Week 2): Pt will complete transfers with min assist and LRAD PT Short Term Goal 3 (Week 2): Pt will complete WC mobility 150' modI PT Short Term Goal 4 (Week 2): Pt will complete sit to stand maintaining LLE NWB with max assist x2  Skilled Therapeutic Interventions/Progress Updates:    SESSION 1: Pt presents in room in bed, agreeable to PT. Pt reporting increased pain this AM, 6/10 in BLEs R>L. Therapist communicates with RN who presents during session. Session focusd on therapeutic activities to promote improved participation with bed mobility and transfers. Therapist dons pt ortho boot total assist in supine and removes pillows, provides gait belt as strap for pt to move RLE with decreased assist from therapist. Pt completes supine to sit with increased time, therapist providing cues for moving RLE off EOB. Pt takes increased time to acclimate to upright, during which he takes medications sitting in unsupported sitting without UE support. Pt completes slideboard transfer with min assist to L with assist for placing slideboard and min assist for managing RLE movement pt demonstrating improving scooting mechanics with pt able to manage hips into WC without assist from therapist. Pt then positioned with BLEs elevated via leg rests, all needs within reach, call light in place at end of session.   SESSION 2: Pt presents in room in [facility], pt mother present, pt agreeable to PT. Pt states pain medication is helping with pain but pt does report pain in medial knee on RLE with transfers and adducted position. Session  focused on WC mobility and therapeutic actiivities to facilitate improved body mechanics and strengthening for transfers and bed mobility. Pt self propels WC with BUEs from room to day room, slowly but does not report UE or rib pain. Pt positioned for transfer. Pt completes slideboard transfer with min assist for placing slideboard, supervision during transfer, increased time to complete however pt able to manage BLEs without assist. Pt prompted to trial sit to stand again with 2nd person positioned to assist however once mat elevated pt reporting feeling too much pain in R knee to tolerate weightbearing and requests to attempt on a different day. Pt then compeltes bed mobility with min assist! Using gait belt as strap to manage RLE into bed, pt able to scoot posteriorly to bring LLE onto mat, requires min assist to prevent RLE from sliding back to floor and prevent pain however pt able to manage onto mat and transition from long sitting to supine. Pt takes extended supine rest break due to fatigue and pain, then completes supine to long sitting with mod cues and demonstration for technique, pt able to come up onto R elbow and then transition to long sitting. Pt then completes long sitting<>supine one more trial with rest break. Once in long sitting again pt completes long sitting to short sitting managing RLE off mat with strap, completes with supervision. Pt then completes short sitting to long sitting with supervision with increased time however improved ability to manage RLE onto mat with strap. Pt remains in long sitting and participates with posterior and anterior scooting in long  sitting x5 reps forward/backwards demonstrating good gluteal clearance and technique, completed to promote improved bed mobility and transfers. Pt then returns to supine for extended break, DO present to assess RLE pain and incisions. Pt completes supine>long sitting>short sitting with supervision and increased time then completes  slideboard transfer with increased time with min assist. Pt returns to room and remains seated in [facility] with call light in place and RN notified to assist pt back to bed at end of session.   SESSION 3: Pt presents in room in bed, agreeable to PT but requesting bed session. Session focused on therapeutic exercise for BUE/BLE strengthening needed for bed mobility, WC mobility, and transfers. Excercises included: - SLR x10 BLE - chest press x10 5# - hip flexion x10 LLE - skull crushers x10 5# - hip abduction x10 BLE - straight arm lat pull down x15 yellow band - heel slides x10 LLE - band pull aparts yellow band x15 - pull to sit x5 using hospital bed rails - posterior scooting x5 Pt remains semi reclined with all needs within reach, call light in place, ice placed on L knee, pillows positioned for comfort at end of session.       Therapy Documentation Precautions:  Precautions Precautions: Fall Precaution/Restrictions Comments: watch BP Required Braces or Orthoses: Knee Immobilizer - Right, Other Brace Knee Immobilizer - Right: On at all times Other Brace: R CAM boot, L post op shoe, L RNP splint, L wrist cock up splint Restrictions Weight Bearing Restrictions Per Provider Order: Yes RUE Weight Bearing Per Provider Order: Weight bearing as tolerated LUE Weight Bearing Per Provider Order: Weight bearing as tolerated RLE Weight Bearing Per Provider Order: Weight bearing as tolerated LLE Weight Bearing Per Provider Order: Non weight bearing Other Position/Activity Restrictions: no ROM of R knee, NO ROM restrictions L knee, no shoulder ROM restrictions     Therapy/Group: Individual Therapy  Reche Ohara PT, DPT 02/11/2024, 12:19 PM

## 2024-02-11 NOTE — Progress Notes (Signed)
 "                                                        PROGRESS NOTE   Subjective/Complaints:   No events overnight.  Slept much better with Elavil , did not wake up for pain medication overnight. Felt a little groggy this morning, but not much.  Mom at bedside, answered questions today.  She is concerned about some gauze stuck to his right anterior ankle, did perform dressing change at bedside and used saline to remove most of it.  Some residual sutures and staples noted, notified nursing to change and provide new photos.  Doing excellent in therapies, much better at transfers the past 2 days.  Anxiety is ongoing, but stable.    ROS: Patient denies fever, rash, sore throat, blurred vision, dizziness, nausea, vomiting, diarrhea, cough, shortness of breath or chest pain,   headache, or mood change.  + Bilateral knee pain + Left inner thigh pain + Anxiety + Constipation  Objective:   No results found.   Recent Labs    02/10/24 0459  WBC 4.3  HGB 9.7*  HCT 31.1*  PLT 492*    No results for input(s): NA, K, CL, CO2, GLUCOSE, BUN, CREATININE, CALCIUM  in the last 72 hours.    Intake/Output Summary (Last 24 hours) at 02/11/2024 1651 Last data filed at 02/11/2024 1500 Gross per 24 hour  Intake 1240 ml  Output 2500 ml  Net -1260 ml        Physical Exam: Vital Signs Blood pressure 124/76, pulse 79, temperature 97.6 F (36.4 C), temperature source Oral, resp. rate 17, height 5' 11 (1.803 m), weight 104.5 kg, SpO2 98%.  Constitutional: No distress . Vital signs reviewed.  Working in therapy gym. HEENT: NCAT, EOMI, oral membranes moist Neck: supple Cardiovascular: RRR without murmur. No JVD  .  No tachycardia! Respiratory/Chest: CTA Bilaterally without wheezes or rales. Normal effort    GI/Abdomen: BS +, non-tender, non-distended Ext: no clubbing, cyanosis, or edema--right lower extremity covered in boot. Psych: pleasant, cooperative.  More positive than  prior exams.  Neurologic Exam:    Sensory exam: revealed normal sensation in all dermatomal regions in bilateral lower extremities, right upper extremity, and with reduced sensation to light touch in L dorsal thumb  Motor exam: strength 5/5 throughout right upper extremity and with exception of LUE 3/5 FF, 0/5 FA, 0/5 WE, 4/5 EF, 3/5 EE, 4/5 SA--stable Antigravity against resistance bilateral lower extremities, approximately 4-5   MSK:  RLE in boot   Skin: Suture line intact on right anterior ankle, staple line on right lateral ankle.  Single area of adhered gauze to right anterior shin, removed and redressed.  Other incision lines covered in Mepilex, appears stable.  Assessment/Plan: 1. Functional deficits which require 3+ hours per day of interdisciplinary therapy in a comprehensive inpatient rehab setting. Physiatrist is providing close team supervision and 24 hour management of active medical problems listed below. Physiatrist and rehab team continue to assess barriers to discharge/monitor patient progress toward functional and medical goals  Care Tool:  Bathing    Body parts bathed by patient: Left arm, Chest, Abdomen, Front perineal area, Right upper leg, Left upper leg   Body parts bathed by helper: Right arm, Buttocks, Left lower leg Body parts n/a: Right lower leg  Bathing assist Assist Level: Moderate Assistance - Patient 50 - 74%     Upper Body Dressing/Undressing Upper body dressing   What is the patient wearing?: Pull over shirt    Upper body assist Assist Level: Minimal Assistance - Patient > 75%    Lower Body Dressing/Undressing Lower body dressing      What is the patient wearing?: Pants     Lower body assist Assist for lower body dressing: Total Assistance - Patient < 25%     Toileting Toileting    Toileting assist Assist for toileting: 2 Helpers     Transfers Chair/bed transfer  Transfers assist  Chair/bed transfer activity did not occur:  Safety/medical concerns (orthostatic hypotension)  Chair/bed transfer assist level: Total Assistance - Patient < 25%     Locomotion Ambulation   Ambulation assist   Ambulation activity did not occur: Safety/medical concerns          Walk 10 feet activity   Assist  Walk 10 feet activity did not occur: Safety/medical concerns        Walk 50 feet activity   Assist Walk 50 feet with 2 turns activity did not occur: Safety/medical concerns         Walk 150 feet activity   Assist Walk 150 feet activity did not occur: Safety/medical concerns         Walk 10 feet on uneven surface  activity   Assist Walk 10 feet on uneven surfaces activity did not occur: Safety/medical concerns         Wheelchair     Assist Is the patient using a wheelchair?: Yes Type of Wheelchair: Manual Wheelchair activity did not occur: Safety/medical concerns         Wheelchair 50 feet with 2 turns activity    Assist    Wheelchair 50 feet with 2 turns activity did not occur: Safety/medical concerns       Wheelchair 150 feet activity     Assist  Wheelchair 150 feet activity did not occur: Safety/medical concerns       Blood pressure 124/76, pulse 79, temperature 97.6 F (36.4 C), temperature source Oral, resp. rate 17, height 5' 11 (1.803 m), weight 104.5 kg, SpO2 98%.  Medical Problem List and Plan: 1. Functional deficits secondary to polytrauma             -patient may shower with surgical dressings covered             -ELOS/Goals: 12-16 days, Mod A PT/OT at [facility] level - 02/23/24 -Continue CIR therapies including PT, OT,SLP    Weight bearing: WBAT B/L upper extremities. May weight bear on right leg for transfers in boot and knee immobilizer NWB Left leg and should wear post op shoe on left foot when transferring    Splinting: L radial nerve palsy splint (finger bands) during daytime hours PRN for exercise and functional tasks; SLEEP in black wrist cock-up  splint and wear PRN during the day when not wearing other splint to prevent wrist drop.    1/13: DC home with parents, mom primary caregiver and concerned that she can;t Do much; dad works during daytime. Max A bed mobility very limited by pain and anxiety - up to EOB yesterday - trying slideboard transfer today.  Min A UB, Max A LB care. Downgrading goals to Min A. SLP mild cognitive deficits  - SPV to ind with memory as biuggest deficit.    1-15: Weightbearing status per orthopedics as below.  To follow-up with Dr. Kendal 2 weeks after DC for x-rays of the left humerus and left knee, and Dr.Gebauer/Landau for bilateral tibia left femur and left toe fractures   2.  Antithrombotics: -DVT/anticoagulation:  Mechanical: Sequential compression devices, below knee Bilateral lower extremities Pharmaceutical: Lovenox              -antiplatelet therapy: n/a             -BL LE duplex dopplers negative   - 1-15 per Ortho, transition to Eliquis  2.5 mg twice daily for 30 days for DVT prophylaxis at discharge - will start transition to Eliquis  today for patient comfort on 1-17; last Lovenox  dose tonight 1/16   3. Pain Management: Tylenol  1000 mg q6h, gabapentin  300 mg 3 times daily.  Oxycodone  10 mg q4h and  Robaxin  500 mg q6 PRN.   1/8: Reduce tylenol  to 1000 mg TID, schedule robaxin  500 mg QID, schedule oxycodone  5 mg QID and reduce PRN to 5 mg Q6H PRN  1/11 still chasing pain at times. Pain wakes him up overnight   -will change scheduled oxycodone  5 q6 to oxycontin , titrate that if needed.   - 1-12: Monitor on OxyContin   - 1-13: Increase Robaxin  to 1000 mg every 6 hours scheduled, add tizanidine  2 mg every 6 hours as needed, and encouraged use of as needed oxycodone  prior to therapies  - 1/14: Increase PRN oxycodone  to 5-10 mg for moderate to severe pain; increase at bedtime gabapentin  to 600 mg to assist in at bedtime control  1-15: Using mostly oxycodone  10 overnight, pain remains 6-7 and  interrupting sleep.  Results with increase gabapentin .  We discussed the patient today adding Elavil  versus scheduling tizanidine --will add Elavil  25 mg nightly.  1-16: Slept great with Elavil , continue current regimen  4. Mood/Behavior/Sleep: LCSW to follow for evaluation and support when available.              -anxiety: d/t poor sleep, worry, and claustrophobia; denies nightmares or re-living traumatic event. Continue hydroxyzine  at bedtime.              -sleep: melatonin 5 mg at bedtime.   - 1-8: Not using as needed Atarax , continue nightly scheduled and DC 4 times daily as needed.  If anxiety ongoing limiting factor, will discuss initiation of BuSpar .  1/9-10: Started BuSpar  7.5 mg twice daily.  Encouraged use of as needed Atarax  at night if needed--monitor this weekend.  1/11- some improvement but will increase to 10mg  bid as anxiety still a factor --monitor 1 to 2 days on increased regimen 1-13: Patient endorses this is generally well-controlled, major triggers using bedpan but otherwise doing better 1/14: Sleep interruptions due to pain, melatonin not effective, change to PRN trazodone  50 mg at bedtime. Consider adding elavil  at bedtime if gabapentin  increase not effective.  1-15: Adding Elavil  25 mg nightly-did well with this, slightly groggy this morning but tolerable.   5. Neuropsych/cognition: Psych consult ordered 1/6. Neuropsych Consult ordered in CIR.  This patient is capable of making decisions on his own behalf.   6. Skin/Wound Care: Routine pressure relief measures.               - 1 week post-op; will reach out to orthopedic surgical teams regarding timeline for suture/staple removal, RLE wrapping   -- Per Dr. Reyne, okay to remove sutures/staples. Ordered 1-8 - 1/9: Per Lauraine Moores, left humerus and right tibial incisions can be left open to air.  Per Dr. Josefina, right ankle  dressing to remain intact unless saturated.   1-13: Remove remaining staples in the left proximal  thigh and right knee--reminded nursing on 1-14 1-14: Ortho eval today:  LUE - ok to leave incision open to air LLE - ok to leave knee incision open to air RLE - Staples to be removed today and dressing change PRN 1-16: Nursing to remove residual staples/sutures on right ankle, take new photos today   7. Fluids/Electrolytes/Nutrition: Monitor strict I&O and weights. Follow up labs CBC/CMP               -Regular diet + Ensure and vitamin supplements              - Good p.o. intakes overall   8.  Splenic laceration: 2 cm, monitor hemoglobin with CBC.   9.  Possible liver laceration: Small left zone 2/3 retroperitoneal hematoma.  Same as above   10.  ABLA: S/p 4 units PRBC, 1 unit whole blood, 2 unit FFP on 12/30.  Hemoglobin 8.9 from 8.8.  PLTs improved now to 63.               - 1-7: Stable on admission labs, follow-up labs   1-15: Hemoglobin improving.  11.  AKI: Related to hypovolemia on admission, resolved.  Encourage oral hydration. Monitor BMP               - 1/7: BUN/creatinine stable; monitor, stable at 1-7   - 1-12: A.m. labs stable, continue to encourage p.o. fluids, blood pressure holding up better   - labs Qmonday  12.  Tachycardia/hypertension: Continue Lopressor  25 mg twice daily.               - 1/7: Hypotensive with therapies + tachycardic - 500 cc IVF bolus today--responsive   - 1/8: Remains tachycardic 120s; titrate pain regimen as above; repeat orthostats and add 75 cc/hr drip if +; encourage PO fluids - may need to increase lopressor   1/11 HR a little better this morning (105) -continue lopressor  at 50mg  bid to see if this better controls without dropping bp too much -rx pain and anxiety as above 1/12-16: Improving with pain control, monitor.    13.  Foley: TOV failed 1/1 & 1/5. Foley replaced on 1/6 - Continue Flomax .  1/7 Flomax  moved to at bedtime due to orthostasis.  1/8: + BM yesterday; will plan foley trial Monday 1/12 --order 1/13-14: Continues with high  volume output but has been continent of all voids, continue current regimen -1-15 urinary retention resolved, continent of all voids.  Will continue Flomax  for now given ongoing high-volume output and poor mobility.  14.  Constipation: received mag citrate 1/6 LBM unsure of success. KUB ordered.              -Miralax  increased to BID. Colace BID, as needed enema.              1/7: Straining with orthostasis - give bowel prep  1/8: medium BM with suppository and prep - schedule miralax  17 g BID, sennakot S 1 tab BID; encourage PO fluids as above  1-9: Increase Senokot to 2 tabs twice daily   - Multiple large BMs 1-11.   1-13: Patient endorses trouble using bedpan as huge source of anxiety, encouraged to practice transfers to assist in changing this. 1/14: Monitor through today; may need sorbitol if no BM tonight Last bowel movement 1-14, large.  Still requiring enema. 1-15: Increase Senokot-S to 3 tabs twice daily, continue MiraLAX   17 g twice daily, add as needed lactulose  10 g for moderate constipation.  15. Right 4 through 7, left 9 through 10 rib fractures w/pulmonary contusions: Continue pain control and pulmonary toilet.             - Encourage incentive spirometer and flutter valve. 16. L2-5 transverse process fractures - Pain control. 17. Right scapular spine fracture - Dr. Reyne consulted. This to be treated nonoperatively.  18. Right femur fracture - s/p IM rod 12/30 Dr. Reyne.   - 1/14: RLE knee immobilizer, ok for hip ROM; WBAT for transfers only in boot and KI  19. Open right tib/fib fracture - s/p IM rod right tibia 12/30 Dr. Reyne, ORIF R medial malleolus, Right partial patellectomy Dr. Josefina.   - 1/14: WBAT for tranfers only in boot and KI  20. Left humeral fracture - ORIF 12/31 Dr. Kendal.   - 1/14: WBAT w/ radial nerve palsy splint  21. Left femur fracture - s/p IM rod 12/30 Dr. Josefina. 22. Left foot fractures - s/p closed reduction left toes 12/30. 23. Left tibial  plateau fracture -  washed out 12/30 Dr. Reyne, ORIF 12/31 Hr. Haddix.     - 1/14: LLE unrestricted ROM, NWB  24. Obesity. Body mass index is 32.13 kg/m. Complicates all aspects of care.   LOS: 10 days A FACE TO FACE EVALUATION WAS PERFORMED  Aaron Herrera Likes 02/11/2024, 4:51 PM     "

## 2024-02-11 NOTE — Progress Notes (Signed)
 Occupational Therapy Session Note  Patient Details  Name: Aaron Herrera MRN: [id number] Date of Birth: 2000-08-17  Today's Date: 02/11/2024 OT Individual Time: 8698-8655 OT Individual Time Calculation (min): 43 min    Short Term Goals: Week 2:  OT Short Term Goal 1 (Week 2): Pt will perform toilet transfer with Mod A (x1). OT Short Term Goal 2 (Week 2): Pt will thread LB garments with Mod A + AE. OT Short Term Goal 3 (Week 2): Pt will roll laterally in bed with Mod A (x1) to decrease burden of care.  Skilled Therapeutic Interventions/Progress Updates:  Skilled OT session completed to address ADL retraining and functional transfers. Pt received supine in bed with family present, agreeable to participate in therapy. Pt reports no pain.  OT prompted pt to don wrist cock up splint on LUE, pt donned independently. Supine>EOB completed with CGA using leg lifter to manage RLE. At EOB, pt dons abdominal binder with set-up A; asymptomatic for orthostatic hypotension throughout session. Pt and OT collaborate on best method for toileting, pt respectfully declines WC>BSC in bathroom, expressing more comfort with EOB>BSC transfer. EOB>B-DABSC slideboard transfer with Min A to manage board and LLE during transfer. OT applied dysem under slideboard to reduce risk for falls. Pt requested to toilet, pt continent of bowel and bladder. Toileting hygiene completed with Max A to don incontinence brief, posterior peri care, and pull pants up over bottom. Pt able to remove incontinence brief and attempts posterior peri care; however, d/t pain in R shoulder pt requires A to complete task. Pt respectfully declining transfer to WC off BSC, B-DABSC>EOB Min A for safe board placement and to manage LLE during transfer. EOB>Supine Mod A to manage BLE, pt unable to lift RLE with leg lifter d/t fatigue. Mod +2 A to roll to manage pants. Pt supine in bed with bed alarm on and all needs within reach.    Therapy  Documentation Precautions:  Precautions Precautions: Fall Precaution/Restrictions Comments: watch BP Required Braces or Orthoses: Knee Immobilizer - Right, Other Brace Knee Immobilizer - Right: On at all times Other Brace: R CAM boot, L post op shoe, L RNP splint, L wrist cock up splint Restrictions Weight Bearing Restrictions Per Provider Order: Yes RUE Weight Bearing Per Provider Order: Weight bearing as tolerated LUE Weight Bearing Per Provider Order: Weight bearing as tolerated RLE Weight Bearing Per Provider Order: Weight bearing as tolerated LLE Weight Bearing Per Provider Order: Non weight bearing Other Position/Activity Restrictions: no ROM of R knee, NO ROM restrictions L knee, no shoulder ROM restrictions    Therapy/Group: Individual Therapy  Jalen Oberry Woods-Chance, MS, OTR/L 02/11/2024, 12:40 PM

## 2024-02-12 DIAGNOSIS — T07XXXA Unspecified multiple injuries, initial encounter: Secondary | ICD-10-CM | POA: Diagnosis not present

## 2024-02-12 MED ORDER — TAMSULOSIN HCL 0.4 MG PO CAPS
0.8000 mg | ORAL_CAPSULE | Freq: Every day | ORAL | Status: DC
  Administered 2024-02-12 – 2024-02-24 (×13): 0.8 mg via ORAL
  Filled 2024-02-12 (×13): qty 2

## 2024-02-12 NOTE — Progress Notes (Signed)
 Occupational Therapy Session Note  Patient Details  Name: Aaron Herrera MRN: [id number] Date of Birth: 2000/04/17  Today's Date: 02/12/2024 OT Individual Time: 0700-0810 OT Individual Time Calculation (min): 70 min    Short Term Goals: Week 2:  OT Short Term Goal 1 (Week 2): Pt will perform toilet transfer with Mod A (x1). OT Short Term Goal 2 (Week 2): Pt will thread LB garments with Mod A + AE. OT Short Term Goal 3 (Week 2): Pt will roll laterally in bed with Mod A (x1) to decrease burden of care.  Skilled Therapeutic Interventions/Progress Updates:    Pt resting in bed upon arrival and agreeable to therapy. Pt expressed concern that he was not able to urinate throughout the night and is now feeling pressure. Nursing notified and bladder scan performed after pt returned to supine. Bladder scan at . Pt attempted to void again with success-924ml. Pt reports relief. Skilled OT intervention with focus on bed mobility, TB transfers, directing care, and w/c mobility to increase independence with BADLs. Supine>sit EOB x 2 with min A for LLE mgmt on initial trial. Pt uses gait belt to independently manage RLE off EOB. Sit>supine with min A. TB transfer with min A for RLE mgmt. KI in place on RLE. Post of shoe on LLE. Pt directed positioning of BLE in w/c with gait belt used to maintain positoin. TB placed under BLE with pillows. W/c mobility in hallway with supervision. Pt remained in w/c with all needs within reach.   Therapy Documentation Precautions:  Precautions Precautions: Fall Precaution/Restrictions Comments: watch BP Required Braces or Orthoses: Knee Immobilizer - Right, Other Brace Knee Immobilizer - Right: On at all times Other Brace: R CAM boot, L post op shoe, L RNP splint, L wrist cock up splint Restrictions Weight Bearing Restrictions Per Provider Order: Yes RUE Weight Bearing Per Provider Order: Weight bearing as tolerated LUE Weight Bearing Per Provider Order: Weight  bearing as tolerated RLE Weight Bearing Per Provider Order: Weight bearing as tolerated LLE Weight Bearing Per Provider Order: Non weight bearing Other Position/Activity Restrictions: no ROM of R knee, NO ROM restrictions L knee, no shoulder ROM restrictions Pain: Pt c/o rib pain during transfers and RLE knee cramping during transfers; meds admin during session and repositioned with some relief noted    Therapy/Group: Individual Therapy  [place], Lake[place] 02/12/2024, 8:12 AM

## 2024-02-12 NOTE — Progress Notes (Signed)
 Physical Therapy Session Note  Patient Details  Name: Aaron Herrera MRN: [id number] Date of Birth: 03-Jul-2000  Today's Date: 02/12/2024 PT Individual Time: 9081-9040 PT Individual Time Calculation (min): 41 min   Short Term Goals: Week 2:  PT Short Term Goal 1 (Week 2): Pt will complete bed mobility with mod assist consistently PT Short Term Goal 2 (Week 2): Pt will complete transfers with min assist and LRAD PT Short Term Goal 3 (Week 2): Pt will complete WC mobility 150' modI PT Short Term Goal 4 (Week 2): Pt will complete sit to stand maintaining LLE NWB with max assist x2  Skilled Therapeutic Interventions/Progress Updates:     [facility] received [facility] in [facility] and agrees to therapy. Reports some pain in both legs, unrated. PT provides rest breaks as needed to manage pain. Pt self propels WC multiple bouts during session to work on functional endurance and mobility. PT provides cues for optimal body mechanics and propulsion technique. Pt completes 4x100' with seated rest breaks. During rest breaks, pt performs upper extremity and scapular strengthening exercises to provide stability for anterior muscle groups performing WC propulsion. PT demonstrates exercises. Pt performs 3x15 bilateral shoulder external rotations with red theraband in neutral position, then 3x15 triceps extensions with red theraband, holding anchor end with contralateral upper extremity. Pt performs slideboard transfer back to bed with modA and cues for body curator. ModA for return to supine with management of BLEs. Left supine with all needs within reach.   Therapy Documentation Precautions:  Precautions Precautions: Fall Precaution/Restrictions Comments: watch BP Required Braces or Orthoses: Knee Immobilizer - Right, Other Brace Knee Immobilizer - Right: On at all times Other Brace: R CAM boot, L post op shoe, L RNP splint, L wrist cock up splint Restrictions Weight Bearing Restrictions Per Provider Order: Yes RUE  Weight Bearing Per Provider Order: Weight bearing as tolerated LUE Weight Bearing Per Provider Order: Weight bearing as tolerated RLE Weight Bearing Per Provider Order: Weight bearing as tolerated LLE Weight Bearing Per Provider Order: Non weight bearing Other Position/Activity Restrictions: no ROM of R knee, NO ROM restrictions L knee, no shoulder ROM restrictions   Therapy/Group: Individual Therapy  Elsie JAYSON Dawn, PT, DPT 02/12/2024, 3:36 PM

## 2024-02-12 NOTE — Plan of Care (Signed)
" °  Problem: Consults Goal: RH GENERAL PATIENT EDUCATION Description: See Patient Education module for education specifics. Outcome: Progressing Goal: Skin Care Protocol Initiated - if Braden Score 18 or less Description: If consults are not indicated, leave blank or document N/A Outcome: Progressing Goal: Nutrition Consult-if indicated Outcome: Progressing   Problem: RH BOWEL ELIMINATION Goal: RH STG MANAGE BOWEL WITH ASSISTANCE Description: STG Manage Bowel with mod I Assistance. Outcome: Progressing Goal: RH STG MANAGE BOWEL W/MEDICATION W/ASSISTANCE Description: STG Manage Bowel with Medication with mod I Assistance. Outcome: Progressing   Problem: RH BLADDER ELIMINATION Goal: RH STG MANAGE BLADDER WITH ASSISTANCE Description: STG Manage Bladder With min  Assistance Outcome: Progressing Goal: RH STG MANAGE BLADDER WITH MEDICATION WITH ASSISTANCE Description: STG Manage Bladder With Medication With mod I Assistance. Outcome: Progressing Goal: RH STG MANAGE BLADDER WITH EQUIPMENT WITH ASSISTANCE Description: STG Manage Bladder With Equipment With min Assistance Outcome: Progressing   Problem: RH SKIN INTEGRITY Goal: RH STG SKIN FREE OF INFECTION/BREAKDOWN Description: Manage skin without infection with min assist Outcome: Progressing   Problem: RH SAFETY Goal: RH STG ADHERE TO SAFETY PRECAUTIONS W/ASSISTANCE/DEVICE Description: STG Adhere to Safety Precautions With cues Assistance/Device. Outcome: Progressing   Problem: RH PAIN MANAGEMENT Goal: RH STG PAIN MANAGED AT OR BELOW PT'S PAIN GOAL Description: Pain < 4 with prns Outcome: Progressing   Problem: RH KNOWLEDGE DEFICIT GENERAL Goal: RH STG INCREASE KNOWLEDGE OF SELF CARE AFTER HOSPITALIZATION Description: Patient and parents will be able to manage care using educational resources for medications, skin care/ foley care and dietary modification independently Outcome: Progressing   "

## 2024-02-12 NOTE — Progress Notes (Signed)
 "                                                        PROGRESS NOTE   Subjective/Complaints: No acute complaints.  Did wake up once last night, but overall sleeping much better with the Elavil .  Pain well-controlled. Did have hide bladder scan this a.m. Vital stable   ROS: Patient denies fever, rash, sore throat, blurred vision, dizziness, nausea, vomiting, diarrhea, cough, shortness of breath or chest pain,   headache, or mood change.  + Bilateral knee pain + Left inner thigh pain + Anxiety + Constipation  Objective:   No results found.   Recent Labs    02/10/24 0459  WBC 4.3  HGB 9.7*  HCT 31.1*  PLT 492*    No results for input(s): NA, K, CL, CO2, GLUCOSE, BUN, CREATININE, CALCIUM  in the last 72 hours.    Intake/Output Summary (Last 24 hours) at 02/12/2024 1612 Last data filed at 02/12/2024 1511 Gross per 24 hour  Intake 848 ml  Output 2075 ml  Net -1227 ml        Physical Exam: Vital Signs Blood pressure 125/70, pulse 98, temperature 98.1 F (36.7 C), temperature source Oral, resp. rate 18, height 5' 11 (1.803 m), weight 104.5 kg, SpO2 99%.  Constitutional: No distress . Vital signs reviewed.  Sitting up in bed. HEENT: NCAT, EOMI, oral membranes moist Neck: supple Cardiovascular: RRR without murmur. No JVD  .  No tachycardia! Respiratory/Chest: CTA Bilaterally without wheezes or rales. Normal effort    GI/Abdomen: BS +, non-tender, non-distended Ext: no clubbing, cyanosis, or edema--right lower extremity covered in boot. Psych: pleasant, cooperative.  More positive than prior exams.  Neurologic Exam:    Sensory exam: revealed normal sensation in all dermatomal regions in bilateral lower extremities, right upper extremity, and with reduced sensation to light touch in L dorsal thumb  Motor exam: strength 5/5 throughout right upper extremity and with exception of LUE 3/5 FF, 0/5 FA, 0/5 WE, 4/5 EF, 3/5 EE, 4/5 SA--stable Antigravity  against resistance bilateral lower extremities, approximately 4-5   MSK:  RLE in boot   Skin: Bilateral lower extremities covered in gauze and wrapped in Ace dressing; no apparent drainage.  Stable.  Assessment/Plan: 1. Functional deficits which require 3+ hours per day of interdisciplinary therapy in a comprehensive inpatient rehab setting. Physiatrist is providing close team supervision and 24 hour management of active medical problems listed below. Physiatrist and rehab team continue to assess barriers to discharge/monitor patient progress toward functional and medical goals  Care Tool:  Bathing    Body parts bathed by patient: Left arm, Chest, Abdomen, Front perineal area, Right upper leg, Left upper leg   Body parts bathed by helper: Right arm, Buttocks, Left lower leg Body parts n/a: Right lower leg   Bathing assist Assist Level: Moderate Assistance - Patient 50 - 74%     Upper Body Dressing/Undressing Upper body dressing   What is the patient wearing?: Pull over shirt    Upper body assist Assist Level: Minimal Assistance - Patient > 75%    Lower Body Dressing/Undressing Lower body dressing      What is the patient wearing?: Pants     Lower body assist Assist for lower body dressing: Total Assistance - Patient < 25%  Toileting Toileting    Toileting assist Assist for toileting: 2 Helpers     Transfers Chair/bed transfer  Transfers assist  Chair/bed transfer activity did not occur: Safety/medical concerns (orthostatic hypotension)  Chair/bed transfer assist level: Total Assistance - Patient < 25%     Locomotion Ambulation   Ambulation assist   Ambulation activity did not occur: Safety/medical concerns          Walk 10 feet activity   Assist  Walk 10 feet activity did not occur: Safety/medical concerns        Walk 50 feet activity   Assist Walk 50 feet with 2 turns activity did not occur: Safety/medical concerns         Walk  150 feet activity   Assist Walk 150 feet activity did not occur: Safety/medical concerns         Walk 10 feet on uneven surface  activity   Assist Walk 10 feet on uneven surfaces activity did not occur: Safety/medical concerns         Wheelchair     Assist Is the patient using a wheelchair?: Yes Type of Wheelchair: Manual Wheelchair activity did not occur: Safety/medical concerns         Wheelchair 50 feet with 2 turns activity    Assist    Wheelchair 50 feet with 2 turns activity did not occur: Safety/medical concerns       Wheelchair 150 feet activity     Assist  Wheelchair 150 feet activity did not occur: Safety/medical concerns       Blood pressure 125/70, pulse 98, temperature 98.1 F (36.7 C), temperature source Oral, resp. rate 18, height 5' 11 (1.803 m), weight 104.5 kg, SpO2 99%.  Medical Problem List and Plan: 1. Functional deficits secondary to polytrauma             -patient may shower with surgical dressings covered             -ELOS/Goals: 12-16 days, Mod A PT/OT at [facility] level - 02/23/24 -Continue CIR therapies including PT, OT,SLP    Weight bearing: WBAT B/L upper extremities. May weight bear on right leg for transfers in boot and knee immobilizer NWB Left leg and should wear post op shoe on left foot when transferring    Splinting: L radial nerve palsy splint (finger bands) during daytime hours PRN for exercise and functional tasks; SLEEP in black wrist cock-up splint and wear PRN during the day when not wearing other splint to prevent wrist drop.    1/13: DC home with parents, mom primary caregiver and concerned that she can;t Do much; dad works during daytime. Max A bed mobility very limited by pain and anxiety - up to EOB yesterday - trying slideboard transfer today.  Min A UB, Max A LB care. Downgrading goals to Min A. SLP mild cognitive deficits  - SPV to ind with memory as biuggest deficit.    1-15: Weightbearing status per  orthopedics as below.    To follow-up with Dr. Kendal 2 weeks after DC for x-rays of the left humerus and left knee, and Dr.Gebauer/Landau for bilateral tibia left femur and left toe fractures   2.  Antithrombotics: -DVT/anticoagulation:  Mechanical: Sequential compression devices, below knee Bilateral lower extremities Pharmaceutical: Lovenox              -antiplatelet therapy: n/a             -BL LE duplex dopplers negative   - 1-15 per Ortho, transition  to Eliquis  2.5 mg twice daily for 30 days for DVT prophylaxis at discharge - will start transition to Eliquis  today for patient comfort on 1-17; last Lovenox  dose tonight 1/16--toelrating   3. Pain Management: Tylenol  1000 mg q6h, gabapentin  300 mg 3 times daily.  Oxycodone  10 mg q4h and  Robaxin  500 mg q6 PRN.   1/8: Reduce tylenol  to 1000 mg TID, schedule robaxin  500 mg QID, schedule oxycodone  5 mg QID and reduce PRN to 5 mg Q6H PRN  1/11 still chasing pain at times. Pain wakes him up overnight   -will change scheduled oxycodone  5 q6 to oxycontin , titrate that if needed.   - 1-12: Monitor on OxyContin   - 1-13: Increase Robaxin  to 1000 mg every 6 hours scheduled, add tizanidine  2 mg every 6 hours as needed, and encouraged use of as needed oxycodone  prior to therapies  - 1/14: Increase PRN oxycodone  to 5-10 mg for moderate to severe pain; increase at bedtime gabapentin  to 600 mg to assist in at bedtime control  1-15: Using mostly oxycodone  10 overnight, pain remains 6-7 and interrupting sleep.  Results with increase gabapentin .  We discussed the patient today adding Elavil  versus scheduling tizanidine --will add Elavil  25 mg nightly.  1-16: Slept great with Elavil , continue current regimen  4. Mood/Behavior/Sleep: LCSW to follow for evaluation and support when available.              -anxiety: d/t poor sleep, worry, and claustrophobia; denies nightmares or re-living traumatic event. Continue hydroxyzine  at bedtime.              -sleep:  melatonin 5 mg at bedtime.   - 1-8: Not using as needed Atarax , continue nightly scheduled and DC 4 times daily as needed.  If anxiety ongoing limiting factor, will discuss initiation of BuSpar .  1/9-10: Started BuSpar  7.5 mg twice daily.  Encouraged use of as needed Atarax  at night if needed--monitor this weekend.  1/11- some improvement but will increase to 10mg  bid as anxiety still a factor --monitor 1 to 2 days on increased regimen 1-13: Patient endorses this is generally well-controlled, major triggers using bedpan but otherwise doing better 1/14: Sleep interruptions due to pain, melatonin not effective, change to PRN trazodone  50 mg at bedtime. Consider adding elavil  at bedtime if gabapentin  increase not effective.  1-15: Adding Elavil  25 mg nightly-did well with this, slightly groggy this morning but tolerable.   5. Neuropsych/cognition: Psych consult ordered 1/6. Neuropsych Consult ordered in CIR.  This patient is capable of making decisions on his own behalf.   6. Skin/Wound Care: Routine pressure relief measures.               - 1 week post-op; will reach out to orthopedic surgical teams regarding timeline for suture/staple removal, RLE wrapping   -- Per Dr. Reyne, okay to remove sutures/staples. Ordered 1-8 - 1/9: Per Lauraine Moores, left humerus and right tibial incisions can be left open to air.  Per Dr. Josefina, right ankle dressing to remain intact unless saturated.   1-13: Remove remaining staples in the left proximal thigh and right knee--reminded nursing on 1-14 1-14: Ortho eval today:  LUE - ok to leave incision open to air LLE - ok to leave knee incision open to air RLE - Staples to be removed today and dressing change PRN 1-16: Nursing to remove residual staples/sutures on right ankle, take new photos today    7. Fluids/Electrolytes/Nutrition: Monitor strict I&O and weights. Follow up labs CBC/CMP               -  Regular diet + Ensure and vitamin supplements               - Good p.o. intakes overall   8.  Splenic laceration: 2 cm, monitor hemoglobin with CBC.   9.  Possible liver laceration: Small left zone 2/3 retroperitoneal hematoma.  Same as above   10.  ABLA: S/p 4 units PRBC, 1 unit whole blood, 2 unit FFP on 12/30.  Hemoglobin 8.9 from 8.8.  PLTs improved now to 63.               - 1-7: Stable on admission labs, follow-up labs   1-15: Hemoglobin improving.  11.  AKI: Related to hypovolemia on admission, resolved.  Encourage oral hydration. Monitor BMP               - 1/7: BUN/creatinine stable; monitor, stable at 1-7   - 1-12: A.m. labs stable, continue to encourage p.o. fluids, blood pressure holding up better   - labs Qmonday  12.  Tachycardia/hypertension: Continue Lopressor  25 mg twice daily.               - 1/7: Hypotensive with therapies + tachycardic - 500 cc IVF bolus today--responsive   - 1/8: Remains tachycardic 120s; titrate pain regimen as above; repeat orthostats and add 75 cc/hr drip if +; encourage PO fluids - may need to increase lopressor   1/11 HR a little better this morning (105) -continue lopressor  at 50mg  bid to see if this better controls without dropping bp too much -rx pain and anxiety as above 1/12-16: Improving with pain control, monitor.    13.  Urinary retention: TOV failed 1/1 & 1/5. Foley replaced on 1/6 - Continue Flomax .  1/7 Flomax  moved to at bedtime due to orthostasis.  1/8: + BM yesterday; will plan foley trial Monday 1/12 --order 1/13-14: Continues with high volume output but has been continent of all voids, continue current regimen -1-15 urinary retention resolved, continent of all voids.  Will continue Flomax  for now given ongoing high-volume output and poor mobility. 1-17: High bladder scan yesterday, increase Flomax  to 0.8 mg after supper.  14.  Constipation: received mag citrate 1/6 LBM unsure of success. KUB ordered.              -Miralax  increased to BID. Colace BID, as needed enema.               1/7: Straining with orthostasis - give bowel prep  1/8: medium BM with suppository and prep - schedule miralax  17 g BID, sennakot S 1 tab BID; encourage PO fluids as above  1-9: Increase Senokot to 2 tabs twice daily   - Multiple large BMs 1-11.   1-13: Patient endorses trouble using bedpan as huge source of anxiety, encouraged to practice transfers to assist in changing this. 1/14: Monitor through today; may need sorbitol if no BM tonight Last bowel movement 1-14, large.  Still requiring enema. 1-15: Increase Senokot-S to 3 tabs twice daily, continue MiraLAX  17 g twice daily, add as needed lactulose  10 g for moderate constipation.  1-17 medium bowel movement  15. Right 4 through 7, left 9 through 10 rib fractures w/pulmonary contusions: Continue pain control and pulmonary toilet.             - Encourage incentive spirometer and flutter valve. 16. L2-5 transverse process fractures - Pain control. 17. Right scapular spine fracture - Dr. Reyne consulted. This to be treated nonoperatively.  18. Right femur fracture -  s/p IM rod 12/30 Dr. Reyne.   - 1/14: RLE knee immobilizer, ok for hip ROM; WBAT for transfers only in boot and KI  19. Open right tib/fib fracture - s/p IM rod right tibia 12/30 Dr. Reyne, ORIF R medial malleolus, Right partial patellectomy Dr. Josefina.   - 1/14: WBAT for tranfers only in boot and KI  20. Left humeral fracture - ORIF 12/31 Dr. Kendal.   - 1/14: WBAT w/ radial nerve palsy splint  21. Left femur fracture - s/p IM rod 12/30 Dr. Josefina. 22. Left foot fractures - s/p closed reduction left toes 12/30. 23. Left tibial plateau fracture -  washed out 12/30 Dr. Reyne, ORIF 12/31 Hr. Haddix.     - 1/14: LLE unrestricted ROM, NWB  24. Obesity. Body mass index is 32.13 kg/m. Complicates all aspects of care.   LOS: 11 days A FACE TO FACE EVALUATION WAS PERFORMED  Joesph JAYSON Likes 02/12/2024, 4:12 PM     "

## 2024-02-12 NOTE — Progress Notes (Signed)
 Occupational Therapy Session Note  Patient Details  Name: Aaron Herrera MRN: [id number] Date of Birth: Mar 12, 2000  Today's Date: 02/12/2024 OT Individual Time: 8579-8467 OT Individual Time Calculation (min): 72 min    Short Term Goals: Week 2:  OT Short Term Goal 1 (Week 2): Pt will perform toilet transfer with Mod A (x1). OT Short Term Goal 2 (Week 2): Pt will thread LB garments with Mod A + AE. OT Short Term Goal 3 (Week 2): Pt will roll laterally in bed with Mod A (x1) to decrease burden of care.  Skilled Therapeutic Interventions/Progress Updates:  Skilled OT session completed to address ADL retraining. Pt received supine in bed, agreeable to participate in therapy. Pt reports 3/10 pain at L ribs, OT provided pain intervention by repositioning and providing rest breaks.   Pt requesting to toilet and completed bathing during therapy session. Pt respectfully declining donning wrist cock up splint on LUE during session. Supine>EOB with CGA using leg lifter to manage RLE during task. EOB>B-DABSC slideboard transfer with Min A for board placement and to manage RLE during task, pt attempts to use leg lifter to manage RLE during transfer; however, discontinued d/t increased risk for falls. Pt completed toileting with Mod A for peri care and to pull pants up/down over bottom, pt able to manage pants at thigh level, pt continent of bowel and bladder. B-DABSC>EOB slideboard transfer Min A for board placement. Pt completed EOB level ADL, UB bathing/dressing with set-up A to retrieve shirt and basin, LB bathing/dressing completed with Min A to don/doff clothing at bottom. OT provided education and demo on donning shorts with reacher, pt returned demo with verbal cues and A to lift RLE when weaving in BLE. EOB>Supine Min A to manage RLE. Pt supine in bed with bed alarm on, family member present, direct handoff to nursing staff.   Therapy Documentation Precautions:  Precautions Precautions:  Fall Precaution/Restrictions Comments: watch BP Required Braces or Orthoses: Knee Immobilizer - Right, Other Brace Knee Immobilizer - Right: On at all times Other Brace: R CAM boot, L post op shoe, L RNP splint, L wrist cock up splint Restrictions Weight Bearing Restrictions Per Provider Order: Yes RUE Weight Bearing Per Provider Order: Weight bearing as tolerated LUE Weight Bearing Per Provider Order: Weight bearing as tolerated RLE Weight Bearing Per Provider Order: Weight bearing as tolerated LLE Weight Bearing Per Provider Order: Non weight bearing Other Position/Activity Restrictions: no ROM of R knee, NO ROM restrictions L knee, no shoulder ROM restrictions    Therapy/Group: Individual Therapy  Vianney Kopecky Woods-Chance, MS, OTR/L 02/12/2024, 7:43 AM

## 2024-02-13 DIAGNOSIS — T07XXXA Unspecified multiple injuries, initial encounter: Secondary | ICD-10-CM | POA: Diagnosis not present

## 2024-02-13 MED ORDER — SENNOSIDES-DOCUSATE SODIUM 8.6-50 MG PO TABS
1.0000 | ORAL_TABLET | Freq: Two times a day (BID) | ORAL | Status: DC
  Administered 2024-02-14 – 2024-02-25 (×23): 1 via ORAL
  Filled 2024-02-13 (×23): qty 1

## 2024-02-13 NOTE — Plan of Care (Signed)
" °  Problem: Consults Goal: RH GENERAL PATIENT EDUCATION Description: See Patient Education module for education specifics. Outcome: Progressing Goal: Skin Care Protocol Initiated - if Braden Score 18 or less Description: If consults are not indicated, leave blank or document N/A Outcome: Progressing Goal: Nutrition Consult-if indicated Outcome: Progressing   Problem: RH BOWEL ELIMINATION Goal: RH STG MANAGE BOWEL WITH ASSISTANCE Description: STG Manage Bowel with mod I Assistance. Outcome: Progressing Goal: RH STG MANAGE BOWEL W/MEDICATION W/ASSISTANCE Description: STG Manage Bowel with Medication with mod I Assistance. Outcome: Progressing   Problem: RH BLADDER ELIMINATION Goal: RH STG MANAGE BLADDER WITH ASSISTANCE Description: STG Manage Bladder With min  Assistance Outcome: Progressing Goal: RH STG MANAGE BLADDER WITH MEDICATION WITH ASSISTANCE Description: STG Manage Bladder With Medication With mod I Assistance. Outcome: Progressing Goal: RH STG MANAGE BLADDER WITH EQUIPMENT WITH ASSISTANCE Description: STG Manage Bladder With Equipment With min Assistance Outcome: Progressing   Problem: RH SKIN INTEGRITY Goal: RH STG SKIN FREE OF INFECTION/BREAKDOWN Description: Manage skin without infection with min assist Outcome: Progressing   Problem: RH SAFETY Goal: RH STG ADHERE TO SAFETY PRECAUTIONS W/ASSISTANCE/DEVICE Description: STG Adhere to Safety Precautions With cues Assistance/Device. Outcome: Progressing   Problem: RH PAIN MANAGEMENT Goal: RH STG PAIN MANAGED AT OR BELOW PT'S PAIN GOAL Description: Pain < 4 with prns Outcome: Progressing   Problem: RH KNOWLEDGE DEFICIT GENERAL Goal: RH STG INCREASE KNOWLEDGE OF SELF CARE AFTER HOSPITALIZATION Description: Patient and parents will be able to manage care using educational resources for medications, skin care/ foley care and dietary modification independently Outcome: Progressing   "
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
## 2024-02-14 DIAGNOSIS — K5900 Constipation, unspecified: Secondary | ICD-10-CM | POA: Diagnosis not present

## 2024-02-14 DIAGNOSIS — N179 Acute kidney failure, unspecified: Secondary | ICD-10-CM | POA: Diagnosis not present

## 2024-02-14 DIAGNOSIS — F4321 Adjustment disorder with depressed mood: Secondary | ICD-10-CM

## 2024-02-14 DIAGNOSIS — T07XXXA Unspecified multiple injuries, initial encounter: Secondary | ICD-10-CM | POA: Diagnosis not present

## 2024-02-14 DIAGNOSIS — D62 Acute posthemorrhagic anemia: Secondary | ICD-10-CM

## 2024-02-14 LAB — CBC
HCT: 32.8 % — ABNORMAL LOW (ref 39.0–52.0)
Hemoglobin: 10.4 g/dL — ABNORMAL LOW (ref 13.0–17.0)
MCH: 30.1 pg (ref 26.0–34.0)
MCHC: 31.7 g/dL (ref 30.0–36.0)
MCV: 94.8 fL (ref 80.0–100.0)
Platelets: 444 K/uL — ABNORMAL HIGH (ref 150–400)
RBC: 3.46 MIL/uL — ABNORMAL LOW (ref 4.22–5.81)
RDW: 15.5 % (ref 11.5–15.5)
WBC: 3 K/uL — ABNORMAL LOW (ref 4.0–10.5)
nRBC: 0.7 % — ABNORMAL HIGH (ref 0.0–0.2)

## 2024-02-14 LAB — BASIC METABOLIC PANEL WITH GFR
Anion gap: 12 (ref 5–15)
BUN: 16 mg/dL (ref 6–20)
CO2: 24 mmol/L (ref 22–32)
Calcium: 9.6 mg/dL (ref 8.9–10.3)
Chloride: 104 mmol/L (ref 98–111)
Creatinine, Ser: 0.78 mg/dL (ref 0.61–1.24)
GFR, Estimated: >60 mL/min
Glucose, Bld: 82 mg/dL (ref 70–99)
Potassium: 3.8 mmol/L (ref 3.5–5.1)
Sodium: 140 mmol/L (ref 135–145)
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
## 2024-02-14 NOTE — Progress Notes (Signed)
 Occupational Therapy Session Note  Patient Details  Name: Aaron Herrera MRN: [id number] Date of Birth: [birth year]  Today's Date: 02/14/2024 OT Individual Time: 1300-1355 OT Individual Time Calculation (min): 55 min    Short Term Goals: Week 2:  OT Short Term Goal 1 (Week 2): Pt will perform toilet transfer with Mod A (x1). OT Short Term Goal 2 (Week 2): Pt will thread LB garments with Mod A + AE. OT Short Term Goal 3 (Week 2): Pt will roll laterally in bed with Mod A (x1) to decrease burden of care.  Skilled Therapeutic Interventions/Progress Updates:    Pt resting in w/c wth Mom present. Skilled OT intervention with initial focus on problem solving how to was hair in w/c. Discussion with Mom about home setup and accessibility. Pt assisted with washing hair with use of hair wash tray. Pt engaged in w/c mobility in hallways and ADL apartment. Discussed bathroom accessibility with Mom. Suggested that Mom should take photo of bathroom and share with primary therapists. Discussed home setup and possibiity of moving furniture to make home more w/c accessible. Discussed kitchen setup to assure pt can access items at w/c level. Pt's Mom given home measurement sheet. W/c mobility in ADL apartment with supervision. Pt returned to room and remained in w/c. All needs within reach and Mom present.   Therapy Documentation Precautions:  Precautions Precautions: Fall Precaution/Restrictions Comments: watch BP Required Braces or Orthoses: Knee Immobilizer - Right, Other Brace Knee Immobilizer - Right: On at all times Other Brace: R CAM boot, L post op shoe, L RNP splint, L wrist cock up splint Restrictions Weight Bearing Restrictions Per Provider Order: Yes RUE Weight Bearing Per Provider Order: Weight bearing as tolerated LUE Weight Bearing Per Provider Order: Weight bearing as tolerated RLE Weight Bearing Per Provider Order: Weight bearing as tolerated LLE Weight Bearing Per Provider Order: Non  weight bearing Other Position/Activity Restrictions: no ROM of R knee, NO ROM restrictions L knee, no shoulder ROM restrictions   Pain:  Pt denies pain this afternoon   Therapy/Group: Individual Therapy  Rex, [place] 02/14/2024, 2:55 PM

## 2024-02-14 NOTE — Progress Notes (Signed)
 Patient ID: Aaron Herrera, male   DOB: 2000-06-26, 24 y.o.   MRN: [id number]  SW met with pt and pt mother at request from nursing to follow-up as concerns about discharge date. Pt mother reported pt needs to have the full 4 weeks here in rehab because they are still working on there remodeling of the home- ramp, doorways, etc, and so he can build up his strength as she is unable to lift him. SW encouraged her to explore supports during te day while her husband is at work, and until the aide service can be put in placed with PCS. SW shares there will be updates after team conference tomorrow.   Graeme Jude, MSW, LCSW Office: [phone] Cell: [phone] Fax: [phone]

## 2024-02-14 NOTE — Progress Notes (Signed)
 "                                                        PROGRESS NOTE   Subjective/Complaints: No new complaints or concerns this morning.  Reports bowel movement last night.  Pain is under control.  ROS: Patient denies fever, chills,  rash, sore throat, blurred vision, dizziness, nausea, vomiting, diarrhea, cough, shortness of breath or chest pain,   headache, or mood change.  + Bilateral knee pain + Left inner thigh pain--improved + Anxiety--improved + Constipation--improved  Objective:   No results found.   Recent Labs    02/14/24 0446  WBC 3.0*  HGB 10.4*  HCT 32.8*  PLT 444*     Recent Labs    02/14/24 0446  NA 140  K 3.8  CL 104  CO2 24  GLUCOSE 82  BUN 16  CREATININE 0.78  CALCIUM  9.6      Intake/Output Summary (Last 24 hours) at 02/14/2024 1741 Last data filed at 02/14/2024 1450 Gross per 24 hour  Intake 180 ml  Output 2850 ml  Net -2670 ml        Physical Exam: Vital Signs Blood pressure 135/72, pulse 79, temperature 98 F (36.7 C), temperature source Oral, resp. rate 18, height 5' 11 (1.803 m), weight 104.5 kg, SpO2 100%.  Constitutional: No distress . Vital signs reviewed.  Laying in bed. HEENT: NCAT, EOMI, oral membranes moist Neck: supple Cardiovascular: RRR without murmur. No JVD  .  No tachycardia! Respiratory/Chest: CTA Bilaterally without wheezes or rales. Normal effort    GI/Abdomen: BS +, non-tender, non-distended Ext: no clubbing, cyanosis, or edema--right lower extremity covered in boot. Psych: pleasant, cooperative.  More positive than prior exams.  Neuro: awake and alert, follows commands  Prior exam Neurologic Exam:    Sensory exam: revealed normal sensation in all dermatomal regions in bilateral lower extremities, right upper extremity, and with reduced sensation to light touch in L dorsal thumb--ongoing  Motor exam: strength 5/5 throughout right upper extremity and with exception of LUE 3/5 FF, 1/5 FA, 0/5 WE, 4/5 EF,  3/5 EE, 4/5 SA  Antigravity against resistance bilateral lower extremities, approximately 4-5   MSK:  RLE in boot--no apparent deformity on exam.   Skin: Bilateral lower extremities covered in gauze and wrapped in Ace dressing; no apparent drainage.  -Unwrapped right ankle today, prior staple/suture sites look well-approximated, no apparent drainage.  Anterior shin open area with clean base, similar in appearance to last exam.    Assessment/Plan: 1. Functional deficits which require 3+ hours per day of interdisciplinary therapy in a comprehensive inpatient rehab setting. Physiatrist is providing close team supervision and 24 hour management of active medical problems listed below. Physiatrist and rehab team continue to assess barriers to discharge/monitor patient progress toward functional and medical goals  Care Tool:  Bathing    Body parts bathed by patient: Left arm, Chest, Abdomen, Front perineal area, Right upper leg, Left upper leg   Body parts bathed by helper: Right arm, Buttocks, Left lower leg Body parts n/a: Right lower leg   Bathing assist Assist Level: Moderate Assistance - Patient 50 - 74%     Upper Body Dressing/Undressing Upper body dressing   What is the patient wearing?: Pull over shirt    Upper body assist Assist Level: Minimal  Assistance - Patient > 75%    Lower Body Dressing/Undressing Lower body dressing      What is the patient wearing?: Pants     Lower body assist Assist for lower body dressing: Total Assistance - Patient < 25%     Toileting Toileting    Toileting assist Assist for toileting: 2 Helpers     Transfers Chair/bed transfer  Transfers assist  Chair/bed transfer activity did not occur: Safety/medical concerns (orthostatic hypotension)  Chair/bed transfer assist level: Total Assistance - Patient < 25%     Locomotion Ambulation   Ambulation assist   Ambulation activity did not occur: Safety/medical concerns           Walk 10 feet activity   Assist  Walk 10 feet activity did not occur: Safety/medical concerns        Walk 50 feet activity   Assist Walk 50 feet with 2 turns activity did not occur: Safety/medical concerns         Walk 150 feet activity   Assist Walk 150 feet activity did not occur: Safety/medical concerns         Walk 10 feet on uneven surface  activity   Assist Walk 10 feet on uneven surfaces activity did not occur: Safety/medical concerns         Wheelchair     Assist Is the patient using a wheelchair?: Yes Type of Wheelchair: Manual Wheelchair activity did not occur: Safety/medical concerns         Wheelchair 50 feet with 2 turns activity    Assist    Wheelchair 50 feet with 2 turns activity did not occur: Safety/medical concerns       Wheelchair 150 feet activity     Assist  Wheelchair 150 feet activity did not occur: Safety/medical concerns       Blood pressure 135/72, pulse 79, temperature 98 F (36.7 C), temperature source Oral, resp. rate 18, height 5' 11 (1.803 m), weight 104.5 kg, SpO2 100%.  Medical Problem List and Plan: 1. Functional deficits secondary to polytrauma             -patient may shower with surgical dressings covered             -ELOS/Goals: 12-16 days, Mod A PT/OT at [facility] level - 02/23/24 -Continue CIR therapies including PT, OT,SLP    Weight bearing: WBAT B/L upper extremities. May weight bear on right leg for transfers in boot and knee immobilizer NWB Left leg and should wear post op shoe on left foot when transferring    Splinting: L radial nerve palsy splint (finger bands) during daytime hours PRN for exercise and functional tasks; SLEEP in black wrist cock-up splint and wear PRN during the day when not wearing other splint to prevent wrist drop.    1/13: DC home with parents, mom primary caregiver and concerned that she can;t Do much; dad works during daytime. Max A bed mobility very limited by  pain and anxiety - up to EOB yesterday - trying slideboard transfer today.  Min A UB, Max A LB care. Downgrading goals to Min A. SLP mild cognitive deficits  - SPV to ind with memory as biuggest deficit.    1-15: Weightbearing status per orthopedics as below.    To follow-up with Dr. Kendal 2 weeks after DC for x-rays of the left humerus and left knee, and Dr.Gebauer/Landau for bilateral tibia left femur and left toe fractures -Team conference tomorrow   2.  Antithrombotics: -DVT/anticoagulation:  Mechanical: Sequential compression devices, below knee Bilateral lower extremities Pharmaceutical: Lovenox              -antiplatelet therapy: n/a             -BL LE duplex dopplers negative   - 1-15 per Ortho, transition to Eliquis  2.5 mg twice daily for 30 days for DVT prophylaxis at discharge - will start transition to Eliquis  today for patient comfort on 1-17; last Lovenox  dose tonight 1/16--tolerating   3. Pain Management: Tylenol  1000 mg q6h, gabapentin  300 mg 3 times daily.  Oxycodone  10 mg q4h and  Robaxin  500 mg q6 PRN.   1/8: Reduce tylenol  to 1000 mg TID, schedule robaxin  500 mg QID, schedule oxycodone  5 mg QID and reduce PRN to 5 mg Q6H PRN  1/11 still chasing pain at times. Pain wakes him up overnight   -will change scheduled oxycodone  5 q6 to oxycontin , titrate that if needed.   - 1-12: Monitor on OxyContin   - 1-13: Increase Robaxin  to 1000 mg every 6 hours scheduled, add tizanidine  2 mg every 6 hours as needed, and encouraged use of as needed oxycodone  prior to therapies  - 1/14: Increase PRN oxycodone  to 5-10 mg for moderate to severe pain; increase at bedtime gabapentin  to 600 mg to assist in at bedtime control  1-15: Using mostly oxycodone  10 overnight, pain remains 6-7 and interrupting sleep.  Results with increase gabapentin .  We discussed the patient today adding Elavil  versus scheduling tizanidine --will add Elavil  25 mg nightly.  1-16: Slept great with Elavil , continue current  regimen  1-18: Doing well with current pain regimen, would start weaning OxyContin  this coming week  1/19 has not been using frequent oxycodone , reports pain is controlled.  Will continue current regimen for for today, could consider discontinue OxyContin  in the next day or two  4. Mood/Behavior/Sleep: LCSW to follow for evaluation and support when available.              -anxiety: d/t poor sleep, worry, and claustrophobia; denies nightmares or re-living traumatic event. Continue hydroxyzine  at bedtime.              -sleep: melatonin 5 mg at bedtime.   - 1-8: Not using as needed Atarax , continue nightly scheduled and DC 4 times daily as needed.  If anxiety ongoing limiting factor, will discuss initiation of BuSpar .  1/9-10: Started BuSpar  7.5 mg twice daily.  Encouraged use of as needed Atarax  at night if needed--monitor this weekend.  1/11- some improvement but will increase to 10mg  bid as anxiety still a factor --monitor 1 to 2 days on increased regimen 1-13: Patient endorses this is generally well-controlled, major triggers using bedpan but otherwise doing better 1/14: Sleep interruptions due to pain, melatonin not effective, change to PRN trazodone  50 mg at bedtime. Consider adding elavil  at bedtime if gabapentin  increase not effective.  1-15: Adding Elavil  25 mg nightly-did well with this, slightly groggy this morning but tolerable. --Mood/sleep stable on current regimen   5. Neuropsych/cognition: Psych consult ordered 1/6. Neuropsych Consult ordered in CIR.  This patient is capable of making decisions on his own behalf.   6. Skin/Wound Care: Routine pressure relief measures.               - 1 week post-op; will reach out to orthopedic surgical teams regarding timeline for suture/staple removal, RLE wrapping   -- Per Dr. Reyne, okay to remove sutures/staples. Ordered 1-8 - 1/9: Per Lauraine Moores, left humerus and right  tibial incisions can be left open to air.  Per Dr. Josefina, right ankle  dressing to remain intact unless saturated.   1-13: Remove remaining staples in the left proximal thigh and right knee--reminded nursing on 1-14 1-14: Ortho eval today:  LUE - ok to leave incision open to air LLE - ok to leave knee incision open to air RLE - Staples to be removed today and dressing change PRN 1-16: Nursing to remove residual staples/sutures on right ankle--area looks stable 1-18    7. Fluids/Electrolytes/Nutrition: Monitor strict I&O and weights. Follow up labs CBC/CMP               -Regular diet + Ensure and vitamin supplements              - Good p.o. intakes overall   8.  Splenic laceration: 2 cm, monitor hemoglobin with CBC.   9.  Possible liver laceration: Small left zone 2/3 retroperitoneal hematoma.  Same as above   10.  ABLA: S/p 4 units PRBC, 1 unit whole blood, 2 unit FFP on 12/30.  Hemoglobin 8.9 from 8.8.  PLTs improved now to 63.               - 1-7: Stable on admission labs, follow-up labs   1-19 hemoglobin stable 10.4  11.  AKI: Related to hypovolemia on admission, resolved.  Encourage oral hydration. Monitor BMP               - 1/7: BUN/creatinine stable; monitor, stable at 1-7   - 1-12: A.m. labs stable, continue to encourage p.o. fluids, blood pressure holding up better   -1/19 BUN and creatinine stable today  12.  Tachycardia/hypertension: Continue Lopressor  25 mg twice daily.               - 1/7: Hypotensive with therapies + tachycardic - 500 cc IVF bolus today--responsive   - 1/8: Remains tachycardic 120s; titrate pain regimen as above; repeat orthostats and add 75 cc/hr drip if +; encourage PO fluids - may need to increase lopressor   1/11 HR a little better this morning (105) -continue lopressor  at 50mg  bid to see if this better controls without dropping bp too much -rx pain and anxiety as above 1/12-19: Improving with pain control, monitor.     02/14/2024    4:16 PM 02/14/2024    4:40 AM 02/13/2024    7:17 PM  Vitals with BMI  Systolic  135 125 138  Diastolic 72 73 79  Pulse 79 78 87      13.  Urinary retention: TOV failed 1/1 & 1/5. Foley replaced on 1/6 - Continue Flomax .  1/7 Flomax  moved to at bedtime due to orthostasis.  1/8: + BM yesterday; will plan foley trial Monday 1/12 --order 1/13-14: Continues with high volume output but has been continent of all voids, continue current regimen -1-15 urinary retention resolved, continent of all voids.  Will continue Flomax  for now given ongoing high-volume output and poor mobility. 1-17: High bladder scan yesterday, increase Flomax  to 0.8 mg after supper. 1-18: PVRs low.  Patient endorsing difficulty voiding only when he is laying down.  Encouraged upright seated position or bedside commode for voids.  DC PVRs.  14.  Constipation: received mag citrate 1/6 LBM unsure of success. KUB ordered.              -Miralax  increased to BID. Colace BID, as needed enema.  1/7: Straining with orthostasis - give bowel prep  1/8: medium BM with suppository and prep - schedule miralax  17 g BID, sennakot S 1 tab BID; encourage PO fluids as above  1-9: Increase Senokot to 2 tabs twice daily   - Multiple large BMs 1-11.   1-13: Patient endorses trouble using bedpan as huge source of anxiety, encouraged to practice transfers to assist in changing this. 1/14: Monitor through today; may need sorbitol if no BM tonight Last bowel movement 1-14, large.  Still requiring enema. 1-15: Increase Senokot-S to 3 tabs twice daily, continue MiraLAX  17 g twice daily, add as needed lactulose  10 g for moderate constipation.  1-17 medium bowel movement  1-18: 2 bowel movements yesterday, starting to pick up.  Reduce Senokot-S to 1 tab twice daily  1/19 LBM yesterday  15. Right 4 through 7, left 9 through 10 rib fractures w/pulmonary contusions: Continue pain control and pulmonary toilet.             - Encourage incentive spirometer and flutter valve. 16. L2-5 transverse process fractures - Pain  control. 17. Right scapular spine fracture - Dr. Reyne consulted. This to be treated nonoperatively.  18. Right femur fracture - s/p IM rod 12/30 Dr. Reyne.   - 1/14: RLE knee immobilizer, ok for hip ROM; WBAT for transfers only in boot and KI  19. Open right tib/fib fracture - s/p IM rod right tibia 12/30 Dr. Reyne, ORIF R medial malleolus, Right partial patellectomy Dr. Josefina.   - 1/14: WBAT for tranfers only in boot and KI  20. Left humeral fracture - ORIF 12/31 Dr. Kendal.   - 1/14: WBAT w/ radial nerve palsy splint  21. Left femur fracture - s/p IM rod 12/30 Dr. Josefina. 22. Left foot fractures - s/p closed reduction left toes 12/30. 23. Left tibial plateau fracture -  washed out 12/30 Dr. Reyne, ORIF 12/31 Hr. Haddix.     - 1/14: LLE unrestricted ROM, NWB  24. Obesity. Body mass index is 32.13 kg/m. Complicates all aspects of care.   LOS: 13 days A FACE TO FACE EVALUATION WAS PERFORMED  Murray Collier 02/14/2024, 5:41 PM     "

## 2024-02-14 NOTE — Progress Notes (Signed)
 Physical Therapy Session Note  Patient Details  Name: Aaron Herrera MRN: [id number] Date of Birth: [birth year]  Today's Date: 02/14/2024 PT Individual Time: 1020-1130 + 1405-1500 PT Individual Time Calculation (min): 70 min + 55 min  Short Term Goals: Week 2:  PT Short Term Goal 1 (Week 2): Pt will complete bed mobility with mod assist consistently PT Short Term Goal 2 (Week 2): Pt will complete transfers with min assist and LRAD PT Short Term Goal 3 (Week 2): Pt will complete WC mobility 150' modI PT Short Term Goal 4 (Week 2): Pt will complete sit to stand maintaining LLE NWB with max assist x2  Skilled Therapeutic Interventions/Progress Updates:    SESSION 1: Pt presents in room in bed, agreeable to PT. Pt denies pain at rest and with decreased pain with mobility than in previous sessions. Session focused on therapeutic activities to facilitate bed mobility training, transfer training, weightbearing tolerance on RLE as well as WC mobility training and therapeutic exercise for BLE, BUE, and core strengthening. Pt mother present throughout session. Pt completes bed mobility with assist for removing pillows and placing strap on RLE otherwise supervision with increased time to place BLEs on ground. Pt completes slideboard transfer with cues for placing board and scooting onto board to eliminate need for someone to place for pt, completes with supervision. Pt mother reporting concern with assisting pt with placing leg rests on WC, pt educated on how to remove and place leg rests during session. Pt then self propels with BUEs from room to day room with improved speed initially but slows with fatigue. Pt positioned for transfer to EOB, completes with supervision, assist for placing slideboard with pt able to remove from underneath hip once seated on EOM. Pt then completes x1 sit to stand from max elevated EOM with max assist x2 to RW with RLE only, therapist assist with maintaining LLE NWB, pt able to  maintain standing x5 seconds before needing to sit due to fatigue. Pt denies pain in RLE with stand and following activity. Pt then completes therex in sitting for BLE/BUE/core including: - SLR RLE x10 - hip abduction RLE x10 - seated marches x10 LLE - LAQs LLE x10 - heel slides LLE x10 - seated rows red band x10 - seated straight arm lat pull downs x10 - press ups (yoga blocks) x10 (cues for holding at top of press for last 5 reps) - sit ups from modified reclined position x10 Pt requires frequent rest breaks due to fatigue. Pt returns to room and remains seated in [facility] with all needs within reach, cal light in place with pt mother in room at end of session.   SESSION 2: Pt presents in room in [facility], agreeable to PT. Pt denies pain at rest but reporting fatigue. Session focused on WC mobility and therapeutic activities to promote activity tolerance and transfer/bed mobility training. Pt self propels WC with BUEs from room to ortho gym ~350' increased time to complete due to fatigue. Pt then set up on UBE, completes forward/retro on arm bike at L5 for , completed to promote improved BUE endurance needed for WC propulsion. Therapist provides new elevating leg rests, adjusted to fit pt legs for improving pt maneuverability and ability to self manage leg rests. Pt then self propels halfway back to room, pt mother assisting with pushing pt back in [facility] for the rest of the way as pt mother concerned she will not be able to assist with pushing WC. Pt set up for  transfer into bed however requests to void bladder while sitting in urinal, pt able to manage urinal modI. Pt completes slideboard transfer with assist for setting up board however pt able to manage BLEs and transfer with supervision. Pt then completes sit to supine with strap on RLE as leg lifter with distant supervision, increased time. Pt set up in semi reclined with pillows for comfort, ice on R knee, all needs within reach, call light in place at  end of session.    Therapy Documentation Precautions:  Precautions Precautions: Fall Precaution/Restrictions Comments: watch BP Required Braces or Orthoses: Knee Immobilizer - Right, Other Brace Knee Immobilizer - Right: On at all times Other Brace: R CAM boot, L post op shoe, L RNP splint, L wrist cock up splint Restrictions Weight Bearing Restrictions Per Provider Order: Yes RUE Weight Bearing Per Provider Order: Weight bearing as tolerated LUE Weight Bearing Per Provider Order: Weight bearing as tolerated RLE Weight Bearing Per Provider Order: Weight bearing as tolerated LLE Weight Bearing Per Provider Order: Non weight bearing Other Position/Activity Restrictions: no ROM of R knee, NO ROM restrictions L knee, no shoulder ROM restrictions    Therapy/Group: Individual Therapy  Reche Ohara PT, DPT 02/14/2024, 3:50 PM

## 2024-02-15 ENCOUNTER — Telehealth (HOSPITAL_COMMUNITY): Payer: Self-pay

## 2024-02-15 ENCOUNTER — Other Ambulatory Visit (HOSPITAL_COMMUNITY): Payer: Self-pay

## 2024-02-15 NOTE — Progress Notes (Signed)
 Speech Language Pathology Weekly Progress and Session Note  Patient Details  Name: Aaron Herrera MRN: [id number] Date of Birth: 22-Jan-2001  Beginning of progress report period: February 02, 2024 End of progress report period: February 15, 2024  Today's Date: 02/15/2024 SLP Individual Time: 1000-1057 SLP Individual Time Calculation (min): 57 min  Short Term Goals: Week 1: SLP Short Term Goal 1 (Week 1): Pt will recall detailed information w/ 90% accuracy given supervisionA SLP Short Term Goal 1 - Progress (Week 1): Met SLP Short Term Goal 2 (Week 1): Pt will process complex information w/ supervisionA SLP Short Term Goal 2 - Progress (Week 1): Met SLP Short Term Goal 3 (Week 1): Pt will complex cognitive tasks targeting selective attention or greater w/ supervisionA SLP Short Term Goal 3 - Progress (Week 1): Met SLP Short Term Goal 4 (Week 1): Pt will solve complex problems w/ supervisionA SLP Short Term Goal 4 - Progress (Week 1): Met    New Short Term Goals: Week 2: SLP Short Term Goal 1 (Week 2): STGs= LTGs due to ELOS  Weekly Progress Updates: Pt has made excellent gains and has met 4 of 4 STG's this reporting period due to improved recall, problem solving, and attention within current environement. Currently pt continues to require supervision A for short term recall, and problem solving. Pt/ family education ongoing. Pt would benefit from continued skilled SLP intervention to maximize cognitio in order to maximize his functional independence prior to discharge.  Intensity: Minumum of 1-2 x/day, 30 to 90 minutes Frequency: 1 to 3 out of 7 days Duration/Length of Stay: 1/28 Treatment/Interventions: Cognitive remediation/compensation;Cueing hierarchy;Functional tasks;Therapeutic Activities;Patient/family education  Daily Session  Skilled Therapeutic Interventions: Patient was seen in am to address cognitive re- training. Pt was alerted upon SLP arrival and agreeable for session.  He was agreeable to complete session outside of the room thus session completed in a moderately distracting environment, atrium. Pt reports cognition has largely returned to normal. He does endorse some lingering short term memory deficits, noting difficulty with retention while reading a book specifically. SLP engaging pt in brainstorming of strategies to maximize retention including  reading shorter lengths at a time, taking notes, and intermittent repetition of information. SLP engaging pt in recall of 5 units of information given increasing lengths of duration for recall. Pt initially recalling 3/5 words, and 5/5 words in 15 and 20 minute increments. Pt finally challenged in working memory through [employer] development [employer] to locate items. He completed task with overall     General    Pain  Denies pain  Therapy/Group: Individual Therapy  Joane GORMAN Fuss 02/15/2024, 12:37 PM

## 2024-02-15 NOTE — Progress Notes (Signed)
 Physical Therapy Session Note  Patient Details  Name: Aaron Herrera MRN: [id number] Date of Birth: [birth year]  Today's Date: 02/15/2024 PT Individual Time: 1305-1400 PT Individual Time Calculation (min): 55 min   Short Term Goals: Week 2:  PT Short Term Goal 1 (Week 2): Pt will complete bed mobility with mod assist consistently PT Short Term Goal 2 (Week 2): Pt will complete transfers with min assist and LRAD PT Short Term Goal 3 (Week 2): Pt will complete WC mobility 150' modI PT Short Term Goal 4 (Week 2): Pt will complete sit to stand maintaining LLE NWB with max assist x2  Skilled Therapeutic Interventions/Progress Updates:    Pt presents in room in bed, agreeable to PT. Pt denies pain at rest. Session focused on [facility] mobility training and WC parts management as well as therapeutic activities to facilitate transfer training. Pt self propels WC from room to day room and positions next to mat, therapist providing education on positioning WC for managing leg rests on/off WC with pt able to manage bilateral leg rests off with min assist. Therapist places slideboard then pt able to complete slideboard transfer with supervision to mat managing RLE with strap on R foot. Mat elevated to max height and pt attempts to stand however reporting discomfort on R knee and pt returned to sitting. Knee immobilizer adjusted and tightened and pt attempt again, able to maintain standing ~10 seconds with pt reporting difficulty maintaining L hand grip on RW, returns to sitting. Pt provided with L hemi hand splint on RW and educated on purpose however pt declines to attempt 3rd stand. Pt does report improved comfort and weightbearing tolerance with knee immobilizer adjusted and tightened. Pt takes seated rest break and then completes x10 SLR bilaterally to assist with transfers and bed mobility. Pt then completes slideboard tranfser, pt able to place slideboard but requires assist with holding in place during transfer.  Pt provided with cues and min assist for managing WC armrest. Pt then provided with cues and increased time for managing leg rests onto chair with min assist and cues. Pt requires assist for repositioning in WC and placing BLEs on leg rests. Pt returns to room and remains seated in [facility] with all needs within reach, cal light in place at end of session.   Therapy Documentation Precautions:  Precautions Precautions: Fall Precaution/Restrictions Comments: watch BP Required Braces or Orthoses: Knee Immobilizer - Right, Other Brace Knee Immobilizer - Right: On at all times Other Brace: R CAM boot, L post op shoe, L RNP splint, L wrist cock up splint Restrictions Weight Bearing Restrictions Per Provider Order: Yes RUE Weight Bearing Per Provider Order: Weight bearing as tolerated LUE Weight Bearing Per Provider Order: Weight bearing as tolerated RLE Weight Bearing Per Provider Order: Weight bearing as tolerated LLE Weight Bearing Per Provider Order: Non weight bearing Other Position/Activity Restrictions: no ROM of R knee, NO ROM restrictions L knee, no shoulder ROM restrictions   Therapy/Group: Individual Therapy  Reche Ohara PT, DPT 02/15/2024, 4:37 PM

## 2024-02-15 NOTE — Progress Notes (Signed)
 "                                                        PROGRESS NOTE   Subjective/Complaints: No new complaints or concerns this morning.  Reports bowel movement last night.  Pain is under control.  ROS: Patient denies fever, chills,  rash, sore throat, blurred vision, dizziness, nausea, vomiting, diarrhea, cough, shortness of breath or chest pain,   headache, or mood change.  + Bilateral knee pain + Left inner thigh pain--improved + Anxiety--improved + Constipation--improved  Objective:   No results found.   Recent Labs    02/14/24 0446  WBC 3.0*  HGB 10.4*  HCT 32.8*  PLT 444*     Recent Labs    02/14/24 0446  NA 140  K 3.8  CL 104  CO2 24  GLUCOSE 82  BUN 16  CREATININE 0.78  CALCIUM  9.6      Intake/Output Summary (Last 24 hours) at 02/15/2024 1044 Last data filed at 02/15/2024 0700 Gross per 24 hour  Intake 876 ml  Output 2325 ml  Net -1449 ml        Physical Exam: Vital Signs Blood pressure 122/62, pulse 77, temperature 98.4 F (36.9 C), temperature source Oral, resp. rate 18, height 5' 11 (1.803 m), weight 104.5 kg, SpO2 97%.  Constitutional: No distress . Vital signs reviewed.  Laying in bed. HEENT: NCAT, EOMI, oral membranes moist Neck: supple Cardiovascular: RRR without murmur. No JVD  .  No tachycardia! Respiratory/Chest: CTA Bilaterally without wheezes or rales. Normal effort    GI/Abdomen: BS +, non-tender, non-distended Ext: no clubbing, cyanosis, or edema--right lower extremity covered in boot. Psych: pleasant, cooperative.  More positive than prior exams.  Neuro: awake and alert, follows commands  Prior exam Neurologic Exam:    Sensory exam: revealed normal sensation in all dermatomal regions in bilateral lower extremities, right upper extremity, and with reduced sensation to light touch in L dorsal thumb--ongoing  Motor exam: strength 5/5 throughout right upper extremity and with exception of LUE 3/5 FF, 1/5 FA, 0/5 WE, 4/5 EF,  3/5 EE, 4/5 SA  Antigravity against resistance bilateral lower extremities, approximately 4-5   MSK:  RLE in boot--no apparent deformity on exam.   Skin: Bilateral lower extremities covered in gauze and wrapped in Ace dressing; no apparent drainage.  -Unwrapped right ankle today, prior staple/suture sites look well-approximated, no apparent drainage.  Anterior shin open area with clean base, similar in appearance to last exam.    Assessment/Plan: 1. Functional deficits which require 3+ hours per day of interdisciplinary therapy in a comprehensive inpatient rehab setting. Physiatrist is providing close team supervision and 24 hour management of active medical problems listed below. Physiatrist and rehab team continue to assess barriers to discharge/monitor patient progress toward functional and medical goals  Care Tool:  Bathing    Body parts bathed by patient: Left arm, Chest, Abdomen, Front perineal area, Right upper leg, Left upper leg   Body parts bathed by helper: Right arm, Buttocks, Left lower leg Body parts n/a: Right lower leg   Bathing assist Assist Level: Moderate Assistance - Patient 50 - 74%     Upper Body Dressing/Undressing Upper body dressing   What is the patient wearing?: Pull over shirt    Upper body assist Assist Level: Minimal  Assistance - Patient > 75%    Lower Body Dressing/Undressing Lower body dressing      What is the patient wearing?: Pants     Lower body assist Assist for lower body dressing: Total Assistance - Patient < 25%     Toileting Toileting    Toileting assist Assist for toileting: 2 Helpers     Transfers Chair/bed transfer  Transfers assist  Chair/bed transfer activity did not occur: Safety/medical concerns (orthostatic hypotension)  Chair/bed transfer assist level: Total Assistance - Patient < 25%     Locomotion Ambulation   Ambulation assist   Ambulation activity did not occur: Safety/medical concerns           Walk 10 feet activity   Assist  Walk 10 feet activity did not occur: Safety/medical concerns        Walk 50 feet activity   Assist Walk 50 feet with 2 turns activity did not occur: Safety/medical concerns         Walk 150 feet activity   Assist Walk 150 feet activity did not occur: Safety/medical concerns         Walk 10 feet on uneven surface  activity   Assist Walk 10 feet on uneven surfaces activity did not occur: Safety/medical concerns         Wheelchair     Assist Is the patient using a wheelchair?: Yes Type of Wheelchair: Manual Wheelchair activity did not occur: Safety/medical concerns         Wheelchair 50 feet with 2 turns activity    Assist    Wheelchair 50 feet with 2 turns activity did not occur: Safety/medical concerns       Wheelchair 150 feet activity     Assist  Wheelchair 150 feet activity did not occur: Safety/medical concerns       Blood pressure 122/62, pulse 77, temperature 98.4 F (36.9 C), temperature source Oral, resp. rate 18, height 5' 11 (1.803 m), weight 104.5 kg, SpO2 97%.  Medical Problem List and Plan: 1. Functional deficits secondary to polytrauma             -patient may shower with surgical dressings covered             -ELOS/Goals: 12-16 days, Mod A PT/OT at [facility] level - 02/23/24 -Continue CIR therapies including PT, OT,SLP    Weight bearing: WBAT B/L upper extremities. May weight bear on right leg for transfers in boot and knee immobilizer NWB Left leg and should wear post op shoe on left foot when transferring    Splinting: L radial nerve palsy splint (finger bands) during daytime hours PRN for exercise and functional tasks; SLEEP in black wrist cock-up splint and wear PRN during the day when not wearing other splint to prevent wrist drop.    - 1/13: DC home with parents, mom primary caregiver and concerned that she can;t Do much; dad works during daytime. Max A bed mobility very limited by  pain and anxiety - up to EOB yesterday - trying slideboard transfer today.  Min A UB, Max A LB care. Downgrading goals to Min A. SLP mild cognitive deficits  - SPV to ind with memory as biuggest deficit.    -1-15: Weightbearing status per orthopedics as below.    To follow-up with Dr. Kendal 2 weeks after DC for x-rays of the left humerus and left knee, and Dr.Gebauer/Landau for bilateral tibia left femur and left toe fractures  -1/20: Mom working on remodeling their home, widening  doorways for his WC. SPV bed mobility, Min A slideboard transfers due to need to place but once down he is supervision, SPV WC ambulation, STS Max A 2x yesterday. Goal SPT for car and bed transfers. Setup UB care, Mod-Max A LB care due to pain but is doing well with adaptive techniques with the reacher this week. [employer] group putting in temporary ramp.    2.  Antithrombotics: -DVT/anticoagulation:  Mechanical: Sequential compression devices, below knee Bilateral lower extremities Pharmaceutical: Lovenox              -antiplatelet therapy: n/a             -BL LE duplex dopplers negative   - 1-15 per Ortho, transition to Eliquis  2.5 mg twice daily for 30 days for DVT prophylaxis at discharge - will start transition to Eliquis  today for patient comfort on 1-17; last Lovenox  dose tonight 1/16--tolerating   3. Pain Management: Tylenol  1000 mg q6h, gabapentin  300 mg 3 times daily.  Oxycodone  10 mg q4h and  Robaxin  500 mg q6 PRN.   1/8: Reduce tylenol  to 1000 mg TID, schedule robaxin  500 mg QID, schedule oxycodone  5 mg QID and reduce PRN to 5 mg Q6H PRN  1/11 still chasing pain at times. Pain wakes him up overnight   -will change scheduled oxycodone  5 q6 to oxycontin , titrate that if needed.   - 1-12: Monitor on OxyContin   - 1-13: Increase Robaxin  to 1000 mg every 6 hours scheduled, add tizanidine  2 mg every 6 hours as needed, and encouraged use of as needed oxycodone  prior to therapies  - 1/14: Increase PRN oxycodone  to  5-10 mg for moderate to severe pain; increase at bedtime gabapentin  to 600 mg to assist in at bedtime control  1-15: Using mostly oxycodone  10 overnight, pain remains 6-7 and interrupting sleep.  Results with increase gabapentin .  We discussed the patient today adding Elavil  versus scheduling tizanidine --will add Elavil  25 mg nightly.  1-16: Slept great with Elavil , continue current regimen  1-18: Doing well with current pain regimen, would start weaning OxyContin  this coming week  1/19 has not been using frequent oxycodone , reports pain is controlled.  Will continue current regimen for for today, could consider discontinue OxyContin  in the next day or two  4. Mood/Behavior/Sleep: LCSW to follow for evaluation and support when available.              -anxiety: d/t poor sleep, worry, and claustrophobia; denies nightmares or re-living traumatic event. Continue hydroxyzine  at bedtime.              -sleep: melatonin 5 mg at bedtime.   - 1-8: Not using as needed Atarax , continue nightly scheduled and DC 4 times daily as needed.  If anxiety ongoing limiting factor, will discuss initiation of BuSpar .  1/9-10: Started BuSpar  7.5 mg twice daily.  Encouraged use of as needed Atarax  at night if needed--monitor this weekend.  1/11- some improvement but will increase to 10mg  bid as anxiety still a factor --monitor 1 to 2 days on increased regimen 1-13: Patient endorses this is generally well-controlled, major triggers using bedpan but otherwise doing better 1/14: Sleep interruptions due to pain, melatonin not effective, change to PRN trazodone  50 mg at bedtime. Consider adding elavil  at bedtime if gabapentin  increase not effective.  1-15: Adding Elavil  25 mg nightly-did well with this, slightly groggy this morning but tolerable. --Mood/sleep stable on current regimen   5. Neuropsych/cognition: Psych consult ordered 1/6. Neuropsych Consult ordered in CIR.  This patient is capable of making decisions on his own  behalf.   6. Skin/Wound Care: Routine pressure relief measures.               - 1 week post-op; will reach out to orthopedic surgical teams regarding timeline for suture/staple removal, RLE wrapping   -- Per Dr. Reyne, okay to remove sutures/staples. Ordered 1-8 - 1/9: Per Lauraine Moores, left humerus and right tibial incisions can be left open to air.  Per Dr. Josefina, right ankle dressing to remain intact unless saturated.   1-13: Remove remaining staples in the left proximal thigh and right knee--reminded nursing on 1-14 1-14: Ortho eval today:  LUE - ok to leave incision open to air LLE - ok to leave knee incision open to air RLE - Staples to be removed today and dressing change PRN 1-16: Nursing to remove residual staples/sutures on right ankle--area looks stable 1-18    7. Fluids/Electrolytes/Nutrition: Monitor strict I&O and weights. Follow up labs CBC/CMP               -Regular diet + Ensure and vitamin supplements              - Good p.o. intakes overall   8.  Splenic laceration: 2 cm, monitor hemoglobin with CBC.   9.  Possible liver laceration: Small left zone 2/3 retroperitoneal hematoma.  Same as above   10.  ABLA: S/p 4 units PRBC, 1 unit whole blood, 2 unit FFP on 12/30.  Hemoglobin 8.9 from 8.8.  PLTs improved now to 63.               - 1-7: Stable on admission labs, follow-up labs   1-19 hemoglobin stable 10.4  11.  AKI: Related to hypovolemia on admission, resolved.  Encourage oral hydration. Monitor BMP               - 1/7: BUN/creatinine stable; monitor, stable at 1-7   - 1-12: A.m. labs stable, continue to encourage p.o. fluids, blood pressure holding up better   -1/19 BUN and creatinine stable today  12.  Tachycardia/hypertension: Continue Lopressor  25 mg twice daily.               - 1/7: Hypotensive with therapies + tachycardic - 500 cc IVF bolus today--responsive   - 1/8: Remains tachycardic 120s; titrate pain regimen as above; repeat orthostats and add 75  cc/hr drip if +; encourage PO fluids - may need to increase lopressor   1/11 HR a little better this morning (105) -continue lopressor  at 50mg  bid to see if this better controls without dropping bp too much -rx pain and anxiety as above 1/12-19: Improving with pain control, monitor.     02/15/2024    4:38 AM 02/14/2024    7:00 PM 02/14/2024    4:16 PM  Vitals with BMI  Systolic 122 148 864  Diastolic 62 77 72  Pulse 77  79      13.  Urinary retention: TOV failed 1/1 & 1/5. Foley replaced on 1/6 - Continue Flomax .  1/7 Flomax  moved to at bedtime due to orthostasis.  1/8: + BM yesterday; will plan foley trial Monday 1/12 --order 1/13-14: Continues with high volume output but has been continent of all voids, continue current regimen -1-15 urinary retention resolved, continent of all voids.  Will continue Flomax  for now given ongoing high-volume output and poor mobility. 1-17: High bladder scan yesterday, increase Flomax  to 0.8 mg after supper. 1-18: PVRs  low.  Patient endorsing difficulty voiding only when he is laying down.  Encouraged upright seated position or bedside commode for voids.  DC PVRs.  14.  Constipation: received mag citrate 1/6 LBM unsure of success. KUB ordered.              -Miralax  increased to BID. Colace BID, as needed enema.              1/7: Straining with orthostasis - give bowel prep  1/8: medium BM with suppository and prep - schedule miralax  17 g BID, sennakot S 1 tab BID; encourage PO fluids as above  1-9: Increase Senokot to 2 tabs twice daily   - Multiple large BMs 1-11.   1-13: Patient endorses trouble using bedpan as huge source of anxiety, encouraged to practice transfers to assist in changing this. 1/14: Monitor through today; may need sorbitol if no BM tonight Last bowel movement 1-14, large.  Still requiring enema. 1-15: Increase Senokot-S to 3 tabs twice daily, continue MiraLAX  17 g twice daily, add as needed lactulose  10 g for moderate  constipation.  1-17 medium bowel movement  1-18: 2 bowel movements yesterday, starting to pick up.  Reduce Senokot-S to 1 tab twice daily  1/19 LBM yesterday  15. Right 4 through 7, left 9 through 10 rib fractures w/pulmonary contusions: Continue pain control and pulmonary toilet.             - Encourage incentive spirometer and flutter valve. 16. L2-5 transverse process fractures - Pain control. 17. Right scapular spine fracture - Dr. Reyne consulted. This to be treated nonoperatively.  18. Right femur fracture - s/p IM rod 12/30 Dr. Reyne.   - 1/14: RLE knee immobilizer, ok for hip ROM; WBAT for transfers only in boot and KI  19. Open right tib/fib fracture - s/p IM rod right tibia 12/30 Dr. Reyne, ORIF R medial malleolus, Right partial patellectomy Dr. Josefina.   - 1/14: WBAT for tranfers only in boot and KI  20. Left humeral fracture - ORIF 12/31 Dr. Kendal.   - 1/14: WBAT w/ radial nerve palsy splint  21. Left femur fracture - s/p IM rod 12/30 Dr. Josefina. 22. Left foot fractures - s/p closed reduction left toes 12/30. 23. Left tibial plateau fracture -  washed out 12/30 Dr. Reyne, ORIF 12/31 Hr. Haddix.     - 1/14: LLE unrestricted ROM, NWB  24. Obesity. Body mass index is 32.13 kg/m. Complicates all aspects of care.   LOS: 14 days A FACE TO FACE EVALUATION WAS PERFORMED  Aaron Herrera Likes 02/15/2024, 10:44 AM     "

## 2024-02-15 NOTE — Patient Care Conference (Signed)
 Inpatient RehabilitationTeam Conference and Plan of Care Update Date: 02/15/2024   Time: 1044 am    Patient Name: Aaron Herrera      Medical Record Number: [id number]  Date of Birth: December 07, 2000 Sex: Male         Room/Bed: 4W04C/4W04C-01 Payor Info: Payor: [place] MEDICAID PREPAID HEALTH PLAN / Plan: [place] MEDICAID HEALTHY BLUE / Product Type: *No Product type* /    Admit Date/Time:  02/01/2024  5:33 PM  Primary Diagnosis:  Critical polytrauma  Hospital Problems: Principal Problem:   Critical polytrauma Active Problems:   Adjustment disorder with depressed mood    Expected Discharge Date: Expected Discharge Date: 02/23/24  Team Members Present: Physician leading conference: Dr. Joesph Likes Social Worker Present: Graeme Jude, LCSW Nurse Present: Eulalio Falls, RN PT Present: Catilin Osborn, PT OT Present: Nereida Habermann, OT SLP Present: Recardo Mole, SLP PPS Coordinator present : Eleanor Colon, SLP     Current Status/Progress Goal Weekly Team Focus  Bowel/Bladder   Patient is continent of bowel and bladder. LBM is 1/19.   Patient will remain continent of bowel and bladder.   Monitor bowel and bladder patterns to prevent constipation, diarrhea or urinary issues.    Swallow/Nutrition/ Hydration               ADL's   Setup A for UB ADLs; Mod-Max A for LB ADLs (strong integration of AE); TB transfers-CGA level surface with assistance for TB placement. Barries: Decreased activity tolerance and decreased functional reach for distal LE & posterior care.   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 Patient has scheduled Tylenol , Gabapentin , Robaxin  and Oxycontin .   Patient will report free of pain.   Continue pain medication regimen and monitor for worsening level of pain.    Skin   Patient has surgical incisions to the right and left leg and left upper arm.   Promote wound healing and prevent infection.  Ongoing skin assessment every shift and as needed. Carry out ordered wound care and monitor for signs of infection and delayed healing.      Discharge Planning:  Pt d/c to home with his parents. Mother is not able to provide any physical assistance. Pt father works and helpful in the evening due to work. PCS referral submitted to insurance. SW will confirm there are no barriers to discharge.    Team Discussion: Patient was admitted post polytrauma. Patient progress limited by fatigue. Decreased functional reach of LE and posterior care, weight bearing tolerance, activity tolerance, pain, anxiety and mild cognitive deficits  Patient on target to meet rehab goals: yes, currently patient needs set up assistance with upper body care and needs mod-max assistance with lower body care. Patient needs min assistance with slide board transfers. Patient was able to demonstrate wheelchair mobility up to 150' with  supervision assistance.   *See Care Plan and progress notes for long and short-term goals.   Revisions to Treatment Plan:  Slide board  Wrist splints Upgraded goals to min assistance  Teaching Needs: Safety, medications transfers toileting, non weight bearing precautions,etc   Current Barriers to Discharge: Decreased caregiver support, Home enviroment access/layout, and Weight bearing  restrictions  Possible Resolutions to Barriers: Family Education     Medical Summary Current Status: Medically complicated by polytrauma with bilateral lower extremity fractures, complex wound care, tachycardia, pain, anxiety, constipation, and intermittent urinary retention  Barriers to Discharge: Hypotension;Medical stability;Self-care education;Uncontrolled Pain;Weight bearing restrictions   Possible Resolutions to [name]: Monitor wounds and offload for healing, titrate anxiety medications and provide support, monitor HR and BP, downtritrate pain regimen this week   Continued Need for Acute Rehabilitation Level of Care: The patient requires daily medical management by a physician with specialized training in physical medicine and rehabilitation for the following reasons: Direction of a multidisciplinary physical rehabilitation program to maximize functional independence : Yes Medical management of patient stability for increased activity during participation in an intensive rehabilitation regime.: Yes Analysis of laboratory values and/or radiology reports with any subsequent need for medication adjustment and/or medical intervention. : Yes   I attest that I was present, lead the team conference, and concur with the assessment and plan of the team.   [name] 02/15/2024, 1044 am

## 2024-02-15 NOTE — Progress Notes (Signed)
 Occupational Therapy Weekly Progress Note  Patient Details  Name: Aaron Herrera MRN: [id number] Date of Birth: Oct 17, 2000  Beginning of progress report period: February 08, 2024 End of progress report period: February 15, 2024  Today's Date: 02/15/2024 OT Individual Time: 0905-1000 OT Individual Time Calculation (min): 55 min   Today's Date: 02/15/2024 OT Individual Time: 1505-1530 OT Individual Time Calculation (min): 25 min    Patient has met 3 of 3 short term goals this reporting period. See below for updated CLOF for BADLs. Current discharge is set for 1/28 with plan to schedule caregiver education closer to discharge.   Patient continues to demonstrate the following deficits: muscle weakness and muscle joint tightness, decreased cardiorespiratoy endurance, and decreased standing balance, decreased balance strategies, and difficulty maintaining precautions and therefore will continue to benefit from skilled OT intervention to enhance overall performance with BADL and Reduce care partner burden.  Patient progressing toward long term goals..  Continue plan of care.  OT Short Term Goals Week 2:  OT Short Term Goal 1 (Week 2): Pt will perform toilet transfer with Mod A (x1). OT Short Term Goal 1 - Progress (Week 2): Met OT Short Term Goal 2 (Week 2): Pt will thread LB garments with Mod A + AE. OT Short Term Goal 2 - Progress (Week 2): Met OT Short Term Goal 3 (Week 2): Pt will roll laterally in bed with Mod A (x1) to decrease burden of care. OT Short Term Goal 3 - Progress (Week 2): Met Week 3:  OT Short Term Goal 1 (Week 3): Pt will complete posterior pericare with Min A + PRN AE. OT Short Term Goal 2 (Week 3): Pt will perform sit<>stand with Mod A in preparation for standing ADLs. OT Short Term Goal 3 (Week 3): Pt will maintain static standce with Min A in preparation for standing ADLs.  Skilled Therapeutic Interventions/Progress Updates:   Session 1: Pt greeted resting in bed   for skilled OT session with focus on BADL retraining and functional transfers.   Pain: Pt with no reports of pain, OT offering intermediate rest breaks and positioning suggestions throughout session to address pain/fatigue and maximize participation/safety in session.   Functional Transfers: Lateral rolling with Mod A, supervision for transition to R EOB. Slide-board transfer from [facility] with OT assisting with slideboard placement.   Self Care Tasks: Pt completes the following self care tasks with levels of assistance noted below, UB: Setup A for sink-side bathing/dressing.  LB: Pt requires Max A to doff sweat pants in supine. Seated EOB, patient threads LLE with supervision, assistance provided for threading RLE (due to thick bracing), total A for hiking over bottom with lateral leans. Bathing with assistance for posterior periarea and distal LLE.   Pt remained sitting in WC with 4Ps assessed and immediate needs met. Pt continues to be appropriate for skilled OT intervention to promote further functional independence in ADLs/IADLs.   Session 2:  Pt greeted sitting in [facility] for skilled OT session with focus on functional transfers and LUE NMR. Pt with intermediate pain in R-knee, self-initiates/directs positioning during session. Pt able to place/remove slide-board with light Min A, performing actual transfer with supervision + using looped gait belt to maneuver RLE. Return to supine with A for BLE elevation due to fatigue. OT applied [employer] One Estim to wrist/digital extensors with trace activation in thumb, but no true movement noted. Multiple locations attempted, plan to use NextWave Estim. Pt remained resting in bed with 4Ps assessed and immediate  needs met. Pt continues to be appropriate for skilled OT intervention to promote further functional independence in ADLs/IADLs.   Therapy Documentation Precautions:  Precautions Precautions: Fall Precaution/Restrictions Comments: watch BP Required  Braces or Orthoses: Knee Immobilizer - Right, Other Brace Knee Immobilizer - Right: On at all times Other Brace: R CAM boot, L post op shoe, L RNP splint, L wrist cock up splint Restrictions Weight Bearing Restrictions Per Provider Order: Yes RUE Weight Bearing Per Provider Order: Weight bearing as tolerated LUE Weight Bearing Per Provider Order: Weight bearing as tolerated RLE Weight Bearing Per Provider Order: Weight bearing as tolerated LLE Weight Bearing Per Provider Order: Non weight bearing Other Position/Activity Restrictions: no ROM of R knee, NO ROM restrictions L knee, no shoulder ROM restrictions   Therapy/Group: Individual Therapy  Nereida Habermann, OTR/L, MSOT  02/15/2024, 7:50 AM

## 2024-02-15 NOTE — Telephone Encounter (Signed)
 Pharmacy Patient Advocate Encounter  Insurance verification completed.    The patient is insured through [facility].     Ran test claim for Eliquis  5mg  tablet and the current 30 day co-pay is $4.   This test claim was processed through [employer]- copay amounts may vary at other pharmacies due to [employer], or as the patient moves through the different stages of their insurance plan.

## 2024-02-15 NOTE — Progress Notes (Signed)
 Patient ID: Aaron Herrera, male   DOB: 05-Feb-2000, 24 y.o.   MRN: [id number]  SW met with pt in room to provide updates from team conference, and d/c date remains 1/28. SW shared will follow-up with his mother.  12- SW spoke with pt mother to inform on d/c date remaining in place as unable to support a longer discharge date given that he is meeting his rehab goals. She also reports the ramp is supposed to be installed this weekend pending weather. She feels he needs rehab. SW explained he is meeting rehab goals,and his therapy will be deferred until he can bear weight to ensure he can reserve his time since [place] the number of sessions. She intends to come to some of his therapy sessions to see that he can take care of himself. She would like to speak with attending, and would like to know when he has follow-up with ortho. SW explained ortho will see him in outpatient,and they would need to set up outpatient referral for therapy once he is able to bear weight. SW  informed medical team on above.  Graeme Jude, MSW, LCSW Office: [phone] Cell: [phone] Fax: [phone]

## 2024-02-16 DIAGNOSIS — T07XXXA Unspecified multiple injuries, initial encounter: Secondary | ICD-10-CM | POA: Diagnosis not present

## 2024-02-16 NOTE — Plan of Care (Signed)
" °  Problem: Sit to Stand Goal: LTG:  Patient will perform sit to stand with assistance level (PT) Description: LTG:  Patient will perform sit to stand with assistance level (PT) Flowsheets (Taken 02/16/2024 1220) LTG: PT will perform sit to stand in preparation for functional mobility with assistance level: Minimal Assistance - Patient > 75% Note: Downgraded to reflect progress   Problem: RH Car Transfers Goal: LTG Patient will perform car transfers with assist (PT) Description: LTG: Patient will perform car transfers with assistance (PT). Flowsheets (Taken 02/16/2024 1220) LTG: Pt will perform car transfers with assist:: Minimal Assistance - Patient > 75% Note: Downgraded to reflect progress   "

## 2024-02-16 NOTE — Progress Notes (Signed)
 Physical Therapy Weekly Progress Note  Patient Details  Name: Aaron Herrera MRN: [id number] Date of Birth: [birth year]  Beginning of progress report period: February 09, 2024 End of progress report period: February 16, 2024  Today's Date: 02/16/2024 PT Individual Time: 9194-9154 + 1305-1400 PT Individual Time Calculation (min): 40 min + 55 min  Patient has met 3 of 4 short term goals.  Pt is making excellent progress towards functional goals. Pt is completing bed mobility with adaptive techniques with supervision for supine<>sit, completes transfers via slideboard with min assist for slideboard placement only, otherwise supervision. Pt still requires supervision with WC mobility due to difficulty managing leg rests and navigating tight spaces. Pt has initiated sit to stand training with RLE, requires max assist x2 for sit to stand for gluteal clearance and managing LLE NWB, able to maintain standing up to 10 seconds. Pt's mother has been present throughout stay with education ongoing.  Patient continues to demonstrate the following deficits muscle weakness, decreased cardiorespiratoy endurance, and decreased standing balance, decreased balance strategies, and difficulty maintaining precautions and therefore will continue to benefit from skilled PT intervention to increase functional independence with mobility.  Patient progressing toward long term goals..  Plan of care adjusted to reflect progress, downgraded sit to stand and car transfer goal to min assist.  PT Short Term Goals Week 2:  PT Short Term Goal 1 (Week 2): Pt will complete bed mobility with mod assist consistently PT Short Term Goal 1 - Progress (Week 2): Met PT Short Term Goal 2 (Week 2): Pt will complete transfers with min assist and LRAD PT Short Term Goal 2 - Progress (Week 2): Met PT Short Term Goal 3 (Week 2): Pt will complete WC mobility 150' modI PT Short Term Goal 3 - Progress (Week 2): Progressing toward goal PT Short  Term Goal 4 (Week 2): Pt will complete sit to stand maintaining LLE NWB with max assist x2 PT Short Term Goal 4 - Progress (Week 2): Met Week 3:  PT Short Term Goal 1 (Week 3): STG = LTG due to ELOS  Skilled Therapeutic Interventions/Progress Updates:    SESSION 1: Pt presents in room in bed, agreeable to PT. Pt denies pain at rest but does report aching in bilateral knees. Session focused on therapeutic activities to promote transfer training, WC mobility training and WC parts management training, and therapeutic exercise to promote BUE strengthening needed for functional transfers. Pt completes bed mobility with strap around RLE with supervision, completes slideboard transfer with min assist for holding board in place only. Pt able to manage placing WC leg rests with min assist and pt self propels WC from room to day room ~150' with supervision, slow propulsion speed. Pt then completes BUE therex including: - seated WC press ups x10 (cues for hand positioning to improve comfort in R shoulder) - rows red band x10 - straight arm lat pull down x10 red band - lat pull down x10 red band - tricep pull down x10 red band - band pull apart yellow band x10 - shoulder ER x10 yellow band Pt returns to room and remains seated in [facility] with all needs within reach, cal light in place, pillows placed beneath BLEs at end of session.   SESSION 2: Pt presents in room in [facility], agreeable to PT. Pt denies pain and states premedicated at start of session. Session focused on therapeutic activities for transfer training for slideboard transfers and sit to stands as well as therapeutic exercises to promote  core/BUE/BLE strengthening needed for transfers and bed mobility, and WC mobility for UE endurance. Pt completes WC mobility with BUEs from room 150' with supervision, increased time due to fatigue. Pt transported the rest of the way to the main gym and positioned for transfer via [facility], therapist manages leg rests but pt able  to manage removing WC armrest and able to set up slideboard for transfer with supervision only throughout trnasfer! Pt then set up with mat elevated to max height, completes sit to stand to RW with hemi splint on L side requires mod assist x2 to stand x3 trials for 10 seconds each, therapist managing LLE NWB and splint in upright position. Pt requires extended rest break after this due to fatigue but does not report increase in pain just states that he feels intermittent cramping sensation in R knee. Pt then completes therex to promote BUE/BLE core strengthening including: - posterior/anterior scooting on mat in long sitting 3x3' on mat - RLE SLR x10 - LLE marching x10 - LLE hamstring curl x10 (therapist supporting under thigh) Pt returns to room and remains seated in [facility] with all needs within reach, cal light in place and at end of session.    Therapy Documentation Precautions:  Precautions Precautions: Fall Precaution/Restrictions Comments: watch BP Required Braces or Orthoses: Knee Immobilizer - Right, Other Brace Knee Immobilizer - Right: On at all times Other Brace: R CAM boot, L post op shoe, L RNP splint, L wrist cock up splint Restrictions Weight Bearing Restrictions Per Provider Order: Yes RUE Weight Bearing Per Provider Order: Weight bearing as tolerated LUE Weight Bearing Per Provider Order: Weight bearing as tolerated RLE Weight Bearing Per Provider Order: Weight bearing as tolerated LLE Weight Bearing Per Provider Order: Non weight bearing Other Position/Activity Restrictions: no ROM of R knee, NO ROM restrictions L knee, no shoulder ROM restrictions   Therapy/Group: Individual Therapy  Reche Ohara PT, DPT 02/16/2024, 12:12 PM

## 2024-02-16 NOTE — Progress Notes (Signed)
 Occupational Therapy Session Note  Patient Details  Name: Aaron Herrera MRN: [id number] Date of Birth: [birth year]  Today's Date: 02/16/2024 OT Individual Time: 1020-1115 OT Individual Time Calculation (min): 55 min   Today's Date: 02/16/2024 OT Individual Time: 8594-8554 OT Individual Time Calculation (min): 40 min    Short Term Goals: Week 2:  OT Short Term Goal 1 (Week 2): Pt will perform toilet transfer with Mod A (x1). OT Short Term Goal 1 - Progress (Week 2): Met OT Short Term Goal 2 (Week 2): Pt will thread LB garments with Mod A + AE. OT Short Term Goal 2 - Progress (Week 2): Met OT Short Term Goal 3 (Week 2): Pt will roll laterally in bed with Mod A (x1) to decrease burden of care. OT Short Term Goal 3 - Progress (Week 2): Met  Skilled Therapeutic Interventions/Progress Updates:   Session 1: Pt greeted sitting in [facility], mother present throughout session, skilled OT session with focus on discharge planning and LUE NMR.   Pain: Pt with intermediate reports of pain in R-shoulder with mobility. OT offering intermediate rest breaks and positioning suggestions throughout session to address pain/fatigue and maximize participation/safety in session.   Self Care/Education: Pt, pt's mother, and OT discuss OT POC, CLOF, and functional expectations for discharge. Pt's mother present a picture of current bathroom setup which is not WC and/or transfer board accessible. Education provided on modifications to be made toileting access with use of DA-BSC in room vs bathroom, as well as, ideas for privacy such as placing as curtain with adjustable rod and use of BSC bucket liners for cleanliness. Provided verbal explanation of logistics of bathing at shower level with current WB/bracing precautions vs sponge-bathing until precautions are lifted. Pt comfortable with sponge-bathing, preferring current routine due to ease. Information provided on shampoo caps, non-rinse shampoo, and hair washing trays  manage hair-care as patient most concern with being able to complete this task. Mother present during conversation but provided no feedback and/or no questions.   Therapeutic Activities/Exercises: NextWave NMES applied to wrist extensors with no activation noted, although multiple positions attempted. Therefore session transitioned to shoulder flexion/abduction in gravity-eliminated plane below waist level to promote gentle WB through limb. Pt with no reports of pain during 2 x 12 reps, OT provides manual/tactile cues intermediately to assure proper alignment.   Pt remained sitting in WC with 4Ps assessed and immediate needs met. Pt continues to be appropriate for skilled OT intervention to promote further functional independence in ADLs/IADLs.   Session 2: Pt greeted sitting in [facility] for skilled OT session with focus on RUE ROM/strengthening. Pt with mild reports of pain with mobility of RUE, rest provided as needed. Pt dependently transported from room<>outdoor atrium for change of scenery for emotional wellness. Pt then instructed in 1x10 reps of shoulder flexion, shoulder abduction, shoulder internal/external rotation, and forward/backward shoulder rotation. Pt unable to tolerate addition of weight this session. Instructed in carryover of exercises for times outside of therapy. Slide-board transfer back to bed with patient placing board, OT stabilizes board but no physical assist provided. Pt requires Max A for BLE elevation into bed due to fatigue. Pt remained resting in bed with 4Ps assessed and immediate needs met. Pt continues to be appropriate for skilled OT intervention to promote further functional independence in ADLs/IADLs.   Therapy Documentation Precautions:  Precautions Precautions: Fall Precaution/Restrictions Comments: watch BP Required Braces or Orthoses: Knee Immobilizer - Right, Other Brace Knee Immobilizer - Right: On at all times Other  Brace: R CAM boot, L post op shoe, L RNP  splint, L wrist cock up splint Restrictions Weight Bearing Restrictions Per Provider Order: Yes RUE Weight Bearing Per Provider Order: Weight bearing as tolerated LUE Weight Bearing Per Provider Order: Weight bearing as tolerated RLE Weight Bearing Per Provider Order: Weight bearing as tolerated LLE Weight Bearing Per Provider Order: Non weight bearing Other Position/Activity Restrictions: no ROM of R knee, NO ROM restrictions L knee, no shoulder ROM restrictions   Therapy/Group: Individual Therapy  Nereida Habermann, OTR/L, MSOT  02/16/2024, 6:31 AM
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
## 2024-02-17 DIAGNOSIS — T07XXXA Unspecified multiple injuries, initial encounter: Secondary | ICD-10-CM | POA: Diagnosis not present

## 2024-02-17 LAB — CBC
HCT: 34.6 % — ABNORMAL LOW (ref 39.0–52.0)
Hemoglobin: 11.1 g/dL — ABNORMAL LOW (ref 13.0–17.0)
MCH: 30.5 pg (ref 26.0–34.0)
MCHC: 32.1 g/dL (ref 30.0–36.0)
MCV: 95.1 fL (ref 80.0–100.0)
Platelets: 353 K/uL (ref 150–400)
RBC: 3.64 MIL/uL — ABNORMAL LOW (ref 4.22–5.81)
RDW: 15 % (ref 11.5–15.5)
WBC: 2.9 K/uL — ABNORMAL LOW (ref 4.0–10.5)
nRBC: 0 % (ref 0.0–0.2)

## 2024-02-17 MED ORDER — METOPROLOL TARTRATE 25 MG PO TABS
25.0000 mg | ORAL_TABLET | Freq: Two times a day (BID) | ORAL | Status: DC
  Administered 2024-02-17 – 2024-02-18 (×2): 25 mg via ORAL
  Filled 2024-02-17 (×2): qty 1

## 2024-02-17 MED ORDER — BUPIVACAINE HCL (PF) 0.25 % IJ SOLN
INTRAMUSCULAR | Status: AC
  Filled 2024-02-17: qty 30

## 2024-02-17 MED ORDER — ACETAMINOPHEN 10 MG/ML IV SOLN
INTRAVENOUS | Status: AC
  Filled 2024-02-17: qty 100

## 2024-02-17 MED ORDER — BUPIVACAINE-EPINEPHRINE (PF) 0.25% -1:200000 IJ SOLN
INTRAMUSCULAR | Status: AC
  Filled 2024-02-17: qty 30

## 2024-02-17 NOTE — Progress Notes (Signed)
 Occupational Therapy Session Note  Patient Details  Name: Aaron Herrera MRN: [id number] Date of Birth: 04/23/2000  Today's Date: 02/17/2024 OT Individual Time: 9164-9069 OT Individual Time Calculation (min): 55 min   Today's Date: 02/17/2024 OT Individual Time: 8694-8654 OT Individual Time Calculation (min): 40 min   Short Term Goals: Week 3:  OT Short Term Goal 1 (Week 3): Pt will complete posterior pericare with Min A + PRN AE. OT Short Term Goal 2 (Week 3): Pt will perform sit<>stand with Mod A in preparation for standing ADLs. OT Short Term Goal 3 (Week 3): Pt will maintain static standce with Min A in preparation for standing ADLs.  Skilled Therapeutic Interventions/Progress Updates:   Session 1: Pt greeted resting in bed for skilled OT session with focus on functional transfers, BADLs retraining, and discharge planning.   Pain: Pt with no reports of pain. LPN administers medication during session. OT offering intermediate rest breaks and positioning suggestions throughout session to address pain/fatigue and maximize participation/safety in session.   Functional Transfers: Bed mobility with supervision + use of bed features. Looped gait belt utilized for RLE management. Pt now able to place slide-board, supervision provided for actual movement.   Self Care Tasks: Pt completes the following self care tasks with levels of assistance noted below, OT provides dependent hair care for promotion of overall health/wellness and skin integrity. Discussion held in regards of home layout and anticipated assistance to be available. Pt reports parents are re-arranging home and father works from [employer]. Handout provided for inflatable shampoo basin for at home use.  Pt completes sink-side grooming with setup A. L-hand utilized at diminished level.   Pt remained sitting in WC with 4Ps assessed and immediate needs met. Pt continues to be appropriate for skilled OT intervention to promote further  functional independence in ADLs/IADLs.   Session 2: Pt greeted in [facility] for skilled OT session with focus on functional transfers and dynamic sitting balance for LB dressing and toileting.   Pain: Pt reported 5/10 pain in R-inner knee, OT offering intermediate rest breaks and positioning suggestions throughout session to address pain/fatigue and maximize participation/safety in session.   Functional Transfers: Patient able to manage slide-board placement and actual transfer with supervision.   Therapeutic Activity: Pt instructed in dynamic sitting balance activity for carryover into LB dressing/bathing/toileting. Disks were placed underneath posterior periarea and BLE. Pt able to lean laterally with intermediate CGA, self-initiating lifting LLE to improve functional reach with Min cues provided to lean further onto his forearm for effectiveness/mechanics of movements.   Following this activity, pt was instructed to hike a looped gait belt over his thighs/bottom, simulating movements required for waist-band management during LB dressing. Pt required intermediate CGA to complete task, improved ability to reach posteriorly and clear buttock.   Pt remained in [facility] with 4Ps assessed and immediate needs met. Pt continues to be appropriate for skilled OT intervention to promote further functional independence in ADLs/IADLs.   Therapy Documentation Precautions:  Precautions Precautions: Fall Precaution/Restrictions Comments: watch BP Required Braces or Orthoses: Knee Immobilizer - Right, Other Brace Knee Immobilizer - Right: On at all times Other Brace: R CAM boot, L post op shoe, L RNP splint, L wrist cock up splint Restrictions Weight Bearing Restrictions Per Provider Order: Yes RUE Weight Bearing Per Provider Order: Weight bearing as tolerated LUE Weight Bearing Per Provider Order: Weight bearing as tolerated RLE Weight Bearing Per Provider Order: Weight bearing as tolerated LLE Weight Bearing Per  Provider Order: Non  weight bearing Other Position/Activity Restrictions: no ROM of R knee, NO ROM restrictions L knee, no shoulder ROM restrictions   Therapy/Group: Individual Therapy  Nereida Habermann, OTR/L, MSOT  02/17/2024, 6:24 AM
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
## 2024-02-17 NOTE — Progress Notes (Signed)
 Physical Therapy Session Note  Patient Details  Name: Aaron Herrera MRN: [id number] Date of Birth: 12/03/2000  Today's Date: 02/17/2024 PT Individual Time: 1102-1200 PT Individual Time Calculation (min): 58 min   Short Term Goals: Week 3:  PT Short Term Goal 1 (Week 3): STG = LTG due to ELOS  Skilled Therapeutic Interventions/Progress Updates:    Pt presents in room in [facility] handoff from NT. Pt agreeable to PT and does not report pain but does state some discomfort and soreness in bilateral knees. Pt transported via WC to day room. Session focused on NMR for BUE/BLE/core strengthening and coordination as well as activity tolerance with pt participating in dance group. Therapist adapts dance moves to suit pt ROM and strength limitations and adds BUE weights for UE movements to increase muscle fiber recruitment and muscular endurance. Pt demonstrating significant improvement in BUE movement, activity tolerance, and BLE movement. Pt participates with all songs, 3-5 minutes in length for each song for ~50 minutes. Pt returns to room and remains seated in [facility] with all needs within reach, cal light in place at end of session.   Therapy Documentation Precautions:  Precautions Precautions: Fall Precaution/Restrictions Comments: watch BP Required Braces or Orthoses: Knee Immobilizer - Right, Other Brace Knee Immobilizer - Right: On at all times Other Brace: R CAM boot, L post op shoe, L RNP splint, L wrist cock up splint Restrictions Weight Bearing Restrictions Per Provider Order: No RUE Weight Bearing Per Provider Order: Weight bearing as tolerated LUE Weight Bearing Per Provider Order: Weight bearing as tolerated RLE Weight Bearing Per Provider Order: Weight bearing as tolerated LLE Weight Bearing Per Provider Order: Non weight bearing Other Position/Activity Restrictions: no ROM of R knee, NO ROM restrictions L knee, no shoulder ROM restrictions   Therapy/Group: Individual Therapy  Reche Ohara PT, DPT 02/17/2024, 5:10 PM

## 2024-02-17 NOTE — Plan of Care (Signed)
" °  Problem: Consults Goal: RH GENERAL PATIENT EDUCATION Description: See Patient Education module for education specifics. Outcome: Progressing Goal: Skin Care Protocol Initiated - if Braden Score 18 or less Description: If consults are not indicated, leave blank or document N/A Outcome: Progressing Goal: Nutrition Consult-if indicated Outcome: Progressing   Problem: RH BOWEL ELIMINATION Goal: RH STG MANAGE BOWEL WITH ASSISTANCE Description: STG Manage Bowel with mod I Assistance. Outcome: Progressing Goal: RH STG MANAGE BOWEL W/MEDICATION W/ASSISTANCE Description: STG Manage Bowel with Medication with mod I Assistance. Outcome: Progressing   Problem: RH BLADDER ELIMINATION Goal: RH STG MANAGE BLADDER WITH ASSISTANCE Description: STG Manage Bladder With min  Assistance Outcome: Progressing Goal: RH STG MANAGE BLADDER WITH MEDICATION WITH ASSISTANCE Description: STG Manage Bladder With Medication With mod I Assistance. Outcome: Progressing Goal: RH STG MANAGE BLADDER WITH EQUIPMENT WITH ASSISTANCE Description: STG Manage Bladder With Equipment With min Assistance Outcome: Progressing   Problem: RH SKIN INTEGRITY Goal: RH STG SKIN FREE OF INFECTION/BREAKDOWN Description: Manage skin without infection with min assist Outcome: Progressing   Problem: RH SAFETY Goal: RH STG ADHERE TO SAFETY PRECAUTIONS W/ASSISTANCE/DEVICE Description: STG Adhere to Safety Precautions With cues Assistance/Device. Outcome: Progressing   Problem: RH PAIN MANAGEMENT Goal: RH STG PAIN MANAGED AT OR BELOW PT'S PAIN GOAL Description: Pain < 4 with prns Outcome: Progressing   Problem: RH KNOWLEDGE DEFICIT GENERAL Goal: RH STG INCREASE KNOWLEDGE OF SELF CARE AFTER HOSPITALIZATION Description: Patient and parents will be able to manage care using educational resources for medications, skin care/ foley care and dietary modification independently Outcome: Progressing   "

## 2024-02-17 NOTE — Progress Notes (Signed)
 "                                                        PROGRESS NOTE   Subjective/Complaints: No acute complaints.  No events overnight.  Pain did increase after therapies today with removal of OxyContin , but patient is tolerating well so far.  No increase in heart rate, blood pressure noted with removal of OxyContin .  Vitals well-controlled.  A.m. labs with WBC slightly low, thrombocytosis normalized, hemoglobin stable.  ROS: Patient denies fever, chills,  rash, sore throat, blurred vision, dizziness, nausea, vomiting, diarrhea, cough, shortness of breath or chest pain,   headache, or mood change.  + Bilateral knee pain    Objective:   No results found.   Recent Labs    02/17/24 0600  WBC 2.9*  HGB 11.1*  HCT 34.6*  PLT 353     No results for input(s): NA, K, CL, CO2, GLUCOSE, BUN, CREATININE, CALCIUM  in the last 72 hours.     Intake/Output Summary (Last 24 hours) at 02/17/2024 1532 Last data filed at 02/17/2024 1302 Gross per 24 hour  Intake 582 ml  Output 1700 ml  Net -1118 ml        Physical Exam: Vital Signs Blood pressure 113/83, pulse 80, temperature 97.8 F (36.6 C), resp. rate 18, height 5' 11 (1.803 m), weight 104.5 kg, SpO2 99%.  Constitutional: No distress . Vital signs reviewed.  Sitting upright in chair.   Cardiovascular: RRR without murmur. No JVD  .  Respiratory/Chest: CTA Bilaterally without wheezes or rales. Normal effort    GI/Abdomen: BS +, non-tender, non-distended Ext: no clubbing, cyanosis; 1+ right lower extremity edema Psych: pleasant, cooperative. Calm  Neurologic Exam:    Sensory exam: revealed normal sensation in all dermatomal regions in bilateral lower extremities, right upper extremity, and with reduced sensation to light touch in L dorsal thumb--ongoing    Motor exam:  strength 5/5 throughout right upper extremity  LUE 4/5 FF, 1/5 FA, 0/5 WE, 4/5 EF, 3/5 EE, 4/5 SA --unchanged Antigravity against  resistance bilateral lower extremities, approximately 4/5, complicated by bracing   MSK:  no apparent deformity on exam.   Skin: Bilateral lower extremities covered in gauze and wrapped in Ace dressing; right knee incision well-approximated with Steri-Strips; ankle incision lines are open to air, well-approximated, and healing.  No drainage, erythema, warmth, or dehiscence at surgical sites.  Assessment/Plan: 1. Functional deficits which require 3+ hours per day of interdisciplinary therapy in a comprehensive inpatient rehab setting. Physiatrist is providing close team supervision and 24 hour management of active medical problems listed below. Physiatrist and rehab team continue to assess barriers to discharge/monitor patient progress toward functional and medical goals  Care Tool:  Bathing    Body parts bathed by patient: Left arm, Chest, Abdomen, Front perineal area, Right upper leg, Left upper leg   Body parts bathed by helper: Right arm, Buttocks, Left lower leg Body parts n/a: Right lower leg   Bathing assist Assist Level: Moderate Assistance - Patient 50 - 74%     Upper Body Dressing/Undressing Upper body dressing   What is the patient wearing?: Pull over shirt    Upper body assist Assist Level: Minimal Assistance - Patient > 75%    Lower Body Dressing/Undressing Lower body dressing  What is the patient wearing?: Pants     Lower body assist Assist for lower body dressing: Total Assistance - Patient < 25%     Toileting Toileting    Toileting assist Assist for toileting: 2 Helpers     Transfers Chair/bed transfer  Transfers assist  Chair/bed transfer activity did not occur: Safety/medical concerns (orthostatic hypotension)  Chair/bed transfer assist level: Total Assistance - Patient < 25%     Locomotion Ambulation   Ambulation assist   Ambulation activity did not occur: Safety/medical concerns          Walk 10 feet activity   Assist   Walk 10 feet activity did not occur: Safety/medical concerns        Walk 50 feet activity   Assist Walk 50 feet with 2 turns activity did not occur: Safety/medical concerns         Walk 150 feet activity   Assist Walk 150 feet activity did not occur: Safety/medical concerns         Walk 10 feet on uneven surface  activity   Assist Walk 10 feet on uneven surfaces activity did not occur: Safety/medical concerns         Wheelchair     Assist Is the patient using a wheelchair?: Yes Type of Wheelchair: Manual Wheelchair activity did not occur: Safety/medical concerns         Wheelchair 50 feet with 2 turns activity    Assist    Wheelchair 50 feet with 2 turns activity did not occur: Safety/medical concerns       Wheelchair 150 feet activity     Assist  Wheelchair 150 feet activity did not occur: Safety/medical concerns       Blood pressure 113/83, pulse 80, temperature 97.8 F (36.6 C), resp. rate 18, height 5' 11 (1.803 m), weight 104.5 kg, SpO2 99%.  Medical Problem List and Plan: 1. Functional deficits secondary to polytrauma             -patient may shower with surgical dressings covered             -ELOS/Goals: 12-16 days, Mod A PT/OT at [facility] level - 02/23/24, pending WC ramp being built -Continue CIR therapies including PT, OT,SLP    Weight bearing: WBAT B/L upper extremities. May weight bear on right leg for transfers in boot and knee immobilizer NWB Left leg and should wear post op shoe on left foot when transferring    Splinting: L radial nerve palsy splint (finger bands) during daytime hours PRN for exercise and functional tasks; SLEEP in black wrist cock-up splint and wear PRN during the day when not wearing other splint to prevent wrist drop.    - 1/13: DC home with parents, mom primary caregiver and concerned that she can;t Do much; dad works during daytime. Max A bed mobility very limited by pain and anxiety - up to EOB  yesterday - trying slideboard transfer today.  Min A UB, Max A LB care. Downgrading goals to Min A. SLP mild cognitive deficits  - SPV to ind with memory as biuggest deficit.    -1-15: Weightbearing status per orthopedics as below.    To follow-up with Dr. Kendal 2 weeks after DC for x-rays of the left humerus and left knee, and Dr.Gebauer/Landau for bilateral tibia left femur and left toe fractures  -1/20: Mom working on remodeling their home, widening doorways for his WC. SPV bed mobility, Min A slideboard transfers due to need to place but  once down he is supervision, SPV WC ambulation, STS Max A 2x yesterday. Goal SPT for car and bed transfers. Setup UB care, Mod-Max A LB care due to pain but is doing well with adaptive techniques with the reacher this week. [employer] putting in temporary ramp--discussed keeping in touch with us  on a date that this will be completed to help coordinate discharge plans.   2.  Antithrombotics: -DVT/anticoagulation:  Mechanical: Sequential compression devices, below knee Bilateral lower extremities Pharmaceutical: Lovenox              -antiplatelet therapy: n/a             -BL LE duplex dopplers negative   - 1-15 per Ortho, transition to Eliquis  2.5 mg twice daily for 30 days for DVT prophylaxis at discharge - will start transition to Eliquis  today for patient comfort on 1-17; last Lovenox  dose tonight 1/16--tolerating   3. Pain Management: Tylenol  1000 mg q6h, gabapentin  300 mg 3 times daily.  Oxycodone  10 mg q4h and  Robaxin  500 mg q6 PRN.   1/8: Reduce tylenol  to 1000 mg TID, schedule robaxin  500 mg QID, schedule oxycodone  5 mg QID and reduce PRN to 5 mg Q6H PRN  1/11 still chasing pain at times. Pain wakes him up overnight   -will change scheduled oxycodone  5 q6 to oxycontin , titrate that if needed.   - 1-12: Monitor on OxyContin   - 1-13: Increase Robaxin  to 1000 mg every 6 hours scheduled, add tizanidine  2 mg every 6 hours as needed, and encouraged use of  as needed oxycodone  prior to therapies  - 1/14: Increase PRN oxycodone  to 5-10 mg for moderate to severe pain; increase at bedtime gabapentin  to 600 mg to assist in at bedtime control  1-15: Using mostly oxycodone  10 overnight, pain remains 6-7 and interrupting sleep.  Results with increase gabapentin .  We discussed the patient today adding Elavil  versus scheduling tizanidine --will add Elavil  25 mg nightly.  1-16: Slept great with Elavil , continue current regimen  1-18: Doing well with current pain regimen, would start weaning OxyContin  this coming week  1/19 has not been using frequent oxycodone , reports pain is controlled.  Will continue current regimen for for today, could consider discontinue OxyContin  in the next day or two  - 1-22: DC OxyContin  this a.m.; monitor oxycodone  use over the next few days to gradually wean  4. Mood/Behavior/Sleep: LCSW to follow for evaluation and support when available.              -anxiety: d/t poor sleep, worry, and claustrophobia; denies nightmares or re-living traumatic event. Continue hydroxyzine  at bedtime.              -sleep: melatonin 5 mg at bedtime.   - 1-8: Not using as needed Atarax , continue nightly scheduled and DC 4 times daily as needed.  If anxiety ongoing limiting factor, will discuss initiation of BuSpar .  1/9-10: Started BuSpar  7.5 mg twice daily.  Encouraged use of as needed Atarax  at night if needed--monitor this weekend.  1/11- some improvement but will increase to 10mg  bid as anxiety still a factor --monitor 1 to 2 days on increased regimen 1-13: Patient endorses this is generally well-controlled, major triggers using bedpan but otherwise doing better 1/14: Sleep interruptions due to pain, melatonin not effective, change to PRN trazodone  50 mg at bedtime. Consider adding elavil  at bedtime if gabapentin  increase not effective.  1-15: Adding Elavil  25 mg nightly-did well with this, slightly groggy this morning but tolerable. --Mood/sleep  stable on current regimen   5. Neuropsych/cognition: Psych consult ordered 1/6. Neuropsych Consult ordered in CIR.  This patient is capable of making decisions on his own behalf.   6. Skin/Wound Care: Routine pressure relief measures.               - 1 week post-op; will reach out to orthopedic surgical teams regarding timeline for suture/staple removal, RLE wrapping   -- Per Dr. Reyne, okay to remove sutures/staples. Ordered 1-8 - 1/9: Per Lauraine Moores, left humerus and right tibial incisions can be left open to air.  Per Dr. Josefina, right ankle dressing to remain intact unless saturated.   1-13: Remove remaining staples in the left proximal thigh and right knee--reminded nursing on 1-14 1-14: Ortho eval today:  LUE - ok to leave incision open to air LLE - ok to leave knee incision open to air RLE - Staples to be removed today and dressing change PRN 1-16: Nursing to remove residual staples/sutures on right ankle--area 1-22: Unwrapped and looked at bilateral lower extremities; all areas healing well    7. Fluids/Electrolytes/Nutrition: Monitor strict I&O and weights. Follow up labs CBC/CMP               -Regular diet + Ensure and vitamin supplements              - Good p.o. intakes overall   8.  Splenic laceration: 2 cm, monitor hemoglobin with CBC.   9.  Possible liver laceration: Small left zone 2/3 retroperitoneal hematoma.  Same as above   10.  ABLA: S/p 4 units PRBC, 1 unit whole blood, 2 unit FFP on 12/30.  Hemoglobin 8.9 from 8.8.  PLTs improved now to 63.               - 1-7: Stable on admission labs, follow-up labs   1-19 hemoglobin stable 10.4  11.  AKI: Related to hypovolemia on admission, resolved.  Encourage oral hydration. Monitor BMP               - 1/7: BUN/creatinine stable; monitor, stable at 1-7   - 1-12: A.m. labs stable, continue to encourage p.o. fluids, blood pressure holding up better   -1/19 BUN and creatinine stable today  12.   Tachycardia/hypertension: Continue Lopressor  25 mg twice daily.               - 1/7: Hypotensive with therapies + tachycardic - 500 cc IVF bolus today--responsive   - 1/8: Remains tachycardic 120s; titrate pain regimen as above; repeat orthostats and add 75 cc/hr drip if +; encourage PO fluids - may need to increase lopressor   1/11 HR a little better this morning (105) -continue lopressor  at 50mg  bid to see if this better controls without dropping bp too much -rx pain and anxiety as above 1/12-19: Improving with pain control, monitor. 1-20: Reducing pain medications tomorrow morning, monitor for recurrent tachycardia, then wean Lopressor  if tolerated. 1-22: Reduce Lopressor  to 25 mg twice daily--will wean further as tolerated     02/17/2024    2:23 PM 02/17/2024    5:50 AM 02/16/2024    8:08 PM  Vitals with BMI  Systolic 113 127 872  Diastolic 83 67 66  Pulse 80 68 92      13.  Urinary retention: TOV failed 1/1 & 1/5. Foley replaced on 1/6 - Continue Flomax .  1/7 Flomax  moved to at bedtime due to orthostasis.  1/8: + BM yesterday; will plan foley trial Monday  1/12 --order 1/13-14: Continues with high volume output but has been continent of all voids, continue current regimen -1-15 urinary retention resolved, continent of all voids.  Will continue Flomax  for now given ongoing high-volume output and poor mobility. 1-17: High bladder scan yesterday, increase Flomax  to 0.8 mg after supper. 1-18: PVRs low.  Patient endorsing difficulty voiding only when he is laying down.  Encouraged upright seated position or bedside commode for voids.  DC PVRs. - Voiding continent on bedside commode on current regimen.  14.  Constipation: received mag citrate 1/6 LBM unsure of success. KUB ordered.              -Miralax  increased to BID. Colace BID, as needed enema.              1/7: Straining with orthostasis - give bowel prep  1/8: medium BM with suppository and prep - schedule miralax  17 g BID,  sennakot S 1 tab BID; encourage PO fluids as above  1-9: Increase Senokot to 2 tabs twice daily   - Multiple large BMs 1-11.   1-13: Patient endorses trouble using bedpan as huge source of anxiety, encouraged to practice transfers to assist in changing this. 1/14: Monitor through today; may need sorbitol if no BM tonight Last bowel movement 1-14, large.  Still requiring enema. 1-15: Increase Senokot-S to 3 tabs twice daily, continue MiraLAX  17 g twice daily, add as needed lactulose  10 g for moderate constipation.  1-17 medium bowel movement  1-18: 2 bowel movements yesterday, starting to pick up.  Reduce Senokot-S to 1 tab twice daily  Large bowel movement 1-22  15. Right 4 through 7, left 9 through 10 rib fractures w/pulmonary contusions: Continue pain control and pulmonary toilet.             - Encourage incentive spirometer and flutter valve. 16. L2-5 transverse process fractures - Pain control. 17. Right scapular spine fracture - Dr. Reyne consulted. This to be treated nonoperatively.  18. Right femur fracture - s/p IM rod 12/30 Dr. Reyne.   - 1/14: RLE knee immobilizer, ok for hip ROM; WBAT for transfers only in boot and KI  19. Open right tib/fib fracture - s/p IM rod right tibia 12/30 Dr. Reyne, ORIF R medial malleolus, Right partial patellectomy Dr. Josefina.   - 1/14: WBAT for tranfers only in boot and KI  20. Left humeral fracture - ORIF 12/31 Dr. Kendal.   - 1/14: WBAT w/ radial nerve palsy splint  21. Left femur fracture - s/p IM rod 12/30 Dr. Josefina. 22. Left foot fractures - s/p closed reduction left toes 12/30. 23. Left tibial plateau fracture -  washed out 12/30 Dr. Reyne, ORIF 12/31 Hr. Haddix.     - 1/14: LLE unrestricted ROM, NWB  24. Obesity. Body mass index is 32.13 kg/m. Complicates all aspects of care.   LOS: 16 days A FACE TO FACE EVALUATION WAS PERFORMED  Aaron Herrera Likes 02/17/2024, 3:32 PM     "

## 2024-02-18 DIAGNOSIS — T07XXXA Unspecified multiple injuries, initial encounter: Secondary | ICD-10-CM | POA: Diagnosis not present

## 2024-02-18 MED ORDER — METHOCARBAMOL 500 MG PO TABS
1000.0000 mg | ORAL_TABLET | Freq: Three times a day (TID) | ORAL | Status: DC
  Administered 2024-02-18 – 2024-02-25 (×20): 1000 mg via ORAL
  Filled 2024-02-18 (×20): qty 2

## 2024-02-18 MED ORDER — METOPROLOL TARTRATE 12.5 MG HALF TABLET
12.5000 mg | ORAL_TABLET | Freq: Two times a day (BID) | ORAL | Status: DC
  Administered 2024-02-18 – 2024-02-19 (×2): 12.5 mg via ORAL
  Filled 2024-02-18 (×2): qty 1

## 2024-02-18 MED ORDER — GABAPENTIN 300 MG PO CAPS
300.0000 mg | ORAL_CAPSULE | Freq: Three times a day (TID) | ORAL | Status: DC
  Administered 2024-02-18 – 2024-02-25 (×20): 300 mg via ORAL
  Filled 2024-02-18 (×20): qty 1

## 2024-02-18 NOTE — Plan of Care (Signed)
" °  Problem: Consults Goal: RH GENERAL PATIENT EDUCATION Description: See Patient Education module for education specifics. Outcome: Progressing Goal: Skin Care Protocol Initiated - if Braden Score 18 or less Description: If consults are not indicated, leave blank or document N/A Outcome: Progressing Goal: Nutrition Consult-if indicated Outcome: Progressing   Problem: RH BOWEL ELIMINATION Goal: RH STG MANAGE BOWEL WITH ASSISTANCE Description: STG Manage Bowel with mod I Assistance. Outcome: Progressing Goal: RH STG MANAGE BOWEL W/MEDICATION W/ASSISTANCE Description: STG Manage Bowel with Medication with mod I Assistance. Outcome: Progressing   Problem: RH BLADDER ELIMINATION Goal: RH STG MANAGE BLADDER WITH ASSISTANCE Description: STG Manage Bladder With min  Assistance Outcome: Progressing Goal: RH STG MANAGE BLADDER WITH MEDICATION WITH ASSISTANCE Description: STG Manage Bladder With Medication With mod I Assistance. Outcome: Progressing Goal: RH STG MANAGE BLADDER WITH EQUIPMENT WITH ASSISTANCE Description: STG Manage Bladder With Equipment With min Assistance Outcome: Progressing   Problem: RH SKIN INTEGRITY Goal: RH STG SKIN FREE OF INFECTION/BREAKDOWN Description: Manage skin without infection with min assist Outcome: Progressing   Problem: RH PAIN MANAGEMENT Goal: RH STG PAIN MANAGED AT OR BELOW PT'S PAIN GOAL Description: Pain < 4 with prns Outcome: Progressing   Problem: RH KNOWLEDGE DEFICIT GENERAL Goal: RH STG INCREASE KNOWLEDGE OF SELF CARE AFTER HOSPITALIZATION Description: Patient and parents will be able to manage care using educational resources for medications, skin care/ foley care and dietary modification independently Outcome: Progressing   "

## 2024-02-18 NOTE — Plan of Care (Signed)
" °  Problem: Consults Goal: Skin Care Protocol Initiated - if Braden Score 18 or less Description: If consults are not indicated, leave blank or document N/A Outcome: Progressing   Problem: RH BOWEL ELIMINATION Goal: RH STG MANAGE BOWEL W/MEDICATION W/ASSISTANCE Description: STG Manage Bowel with Medication with mod I Assistance. Outcome: Progressing   Problem: RH BLADDER ELIMINATION Goal: RH STG MANAGE BLADDER WITH ASSISTANCE Description: STG Manage Bladder With min  Assistance Outcome: Progressing Goal: RH STG MANAGE BLADDER WITH MEDICATION WITH ASSISTANCE Description: STG Manage Bladder With Medication With mod I Assistance. Outcome: Progressing   "

## 2024-02-18 NOTE — Progress Notes (Signed)
 "                                                        PROGRESS NOTE   Subjective/Complaints: No acute complaints.  No events overnight.  Patient endorses good control of pain on current medications.  Is making good progress with therapies.  No increasing in anxiety  Blood pressure and heart rate stable   ROS: Patient denies fever, chills,  rash, sore throat, blurred vision, dizziness, nausea, vomiting, diarrhea, cough, shortness of breath or chest pain,   headache, or mood change.  + Bilateral knee pain    Objective:   No results found.   Recent Labs    02/17/24 0600  WBC 2.9*  HGB 11.1*  HCT 34.6*  PLT 353     No results for input(s): NA, K, CL, CO2, GLUCOSE, BUN, CREATININE, CALCIUM  in the last 72 hours.     Intake/Output Summary (Last 24 hours) at 02/18/2024 1548 Last data filed at 02/18/2024 1500 Gross per 24 hour  Intake 364 ml  Output 2775 ml  Net -2411 ml        Physical Exam: Vital Signs Blood pressure 111/66, pulse 71, temperature 98 F (36.7 C), temperature source Oral, resp. rate 18, height 5' 11 (1.803 m), weight 104.5 kg, SpO2 98%.  Constitutional: No distress . Vital signs reviewed.  Sitting up at bedside.   Cardiovascular: RRR without murmur. No JVD  .  Respiratory/Chest: CTA Bilaterally without wheezes or rales. Normal effort    GI/Abdomen: BS +, non-tender, non-distended Ext: no clubbing, cyanosis; trace right lower extremity edema Psych: pleasant, cooperative. Calm  Neurologic Exam:    Sensory exam: revealed normal sensation in all dermatomal regions in bilateral lower extremities, right upper extremity, and with reduced sensation to light touch in L dorsal thumb--ongoing    Motor exam:  strength 5/5 throughout right upper extremity  LUE 4/5 FF, 1/5 FA, 0/5 WE, 4/5 EF, 4/5 EE, 4/5 SA   Antigravity against resistance bilateral lower extremities, approximately 4/5, complicated by bracing   MSK:  no apparent deformity  on exam.   Skin: Bilateral lower extremities covered in gauze and wrapped in Ace dressing; right knee incision well-approximated with Steri-Strips; ankle incision lines are open to air, well-approximated, and healing.  No drainage, erythema, warmth, or dehiscence at surgical sites    Assessment/Plan: 1. Functional deficits which require 3+ hours per day of interdisciplinary therapy in a comprehensive inpatient rehab setting. Physiatrist is providing close team supervision and 24 hour management of active medical problems listed below. Physiatrist and rehab team continue to assess barriers to discharge/monitor patient progress toward functional and medical goals  Care Tool:  Bathing    Body parts bathed by patient: Left arm, Chest, Abdomen, Front perineal area, Right upper leg, Left upper leg   Body parts bathed by helper: Right arm, Buttocks, Left lower leg Body parts n/a: Right lower leg   Bathing assist Assist Level: Moderate Assistance - Patient 50 - 74%     Upper Body Dressing/Undressing Upper body dressing   What is the patient wearing?: Pull over shirt    Upper body assist Assist Level: Minimal Assistance - Patient > 75%    Lower Body Dressing/Undressing Lower body dressing      What is the patient wearing?: Pants  Lower body assist Assist for lower body dressing: Total Assistance - Patient < 25%     Toileting Toileting    Toileting assist Assist for toileting: 2 Helpers     Transfers Chair/bed transfer  Transfers assist  Chair/bed transfer activity did not occur: Safety/medical concerns (orthostatic hypotension)  Chair/bed transfer assist level: Total Assistance - Patient < 25%     Locomotion Ambulation   Ambulation assist   Ambulation activity did not occur: Safety/medical concerns          Walk 10 feet activity   Assist  Walk 10 feet activity did not occur: Safety/medical concerns        Walk 50 feet activity   Assist Walk 50  feet with 2 turns activity did not occur: Safety/medical concerns         Walk 150 feet activity   Assist Walk 150 feet activity did not occur: Safety/medical concerns         Walk 10 feet on uneven surface  activity   Assist Walk 10 feet on uneven surfaces activity did not occur: Safety/medical concerns         Wheelchair     Assist Is the patient using a wheelchair?: Yes Type of Wheelchair: Manual Wheelchair activity did not occur: Safety/medical concerns         Wheelchair 50 feet with 2 turns activity    Assist    Wheelchair 50 feet with 2 turns activity did not occur: Safety/medical concerns       Wheelchair 150 feet activity     Assist  Wheelchair 150 feet activity did not occur: Safety/medical concerns       Blood pressure 111/66, pulse 71, temperature 98 F (36.7 C), temperature source Oral, resp. rate 18, height 5' 11 (1.803 m), weight 104.5 kg, SpO2 98%.  Medical Problem List and Plan: 1. Functional deficits secondary to polytrauma             -patient may shower with surgical dressings covered             -ELOS/Goals: 12-16 days, Mod A PT/OT at [facility] level - 02/23/24, pending WC ramp being built -Continue CIR therapies including PT, OT,SLP    Weight bearing: WBAT B/L upper extremities. May weight bear on right leg for transfers in boot and knee immobilizer NWB Left leg and should wear post op shoe on left foot when transferring    Splinting: L radial nerve palsy splint (finger bands) during daytime hours PRN for exercise and functional tasks; SLEEP in black wrist cock-up splint and wear PRN during the day when not wearing other splint to prevent wrist drop.    - 1/13: DC home with parents, mom primary caregiver and concerned that she can;t Do much; dad works during daytime. Max A bed mobility very limited by pain and anxiety - up to EOB yesterday - trying slideboard transfer today.  Min A UB, Max A LB care. Downgrading goals to Min A.  SLP mild cognitive deficits  - SPV to ind with memory as biuggest deficit.    -1-15: Weightbearing status per orthopedics as below.    To follow-up with Dr. Kendal 2 weeks after DC for x-rays of the left humerus and left knee, and Dr.Gebauer/Landau for bilateral tibia left femur and left toe fractures  -1/20: Mom working on remodeling their home, widening doorways for his WC. SPV bed mobility, Min A slideboard transfers due to need to place but once down he is supervision, SPV WC  ambulation, STS Max A 2x yesterday. Goal SPT for car and bed transfers. Setup UB care, Mod-Max A LB care due to pain but is doing well with adaptive techniques with the reacher this week. [employer] putting in temporary ramp--discussed keeping in touch with us  on a date that this will be completed to help coordinate discharge plans.  - 1-23: Car transfer not feasible due to height; arranging medical transport for discharge.   2.  Antithrombotics: -DVT/anticoagulation:  Mechanical: Sequential compression devices, below knee Bilateral lower extremities Pharmaceutical: Lovenox              -antiplatelet therapy: n/a             -BL LE duplex dopplers negative   - 1-15 per Ortho, transition to Eliquis  2.5 mg twice daily for 30 days for DVT prophylaxis at discharge - will start transition to Eliquis  today for patient comfort on 1-17; last Lovenox  dose tonight 1/16--tolerating   3. Pain Management: Tylenol  1000 mg q6h, gabapentin  300 mg 3 times daily.  Oxycodone  10 mg q4h and  Robaxin  500 mg q6 PRN.   1/8: Reduce tylenol  to 1000 mg TID, schedule robaxin  500 mg QID, schedule oxycodone  5 mg QID and reduce PRN to 5 mg Q6H PRN  1/11 still chasing pain at times. Pain wakes him up overnight   -will change scheduled oxycodone  5 q6 to oxycontin , titrate that if needed.   - 1-12: Monitor on OxyContin   - 1-13: Increase Robaxin  to 1000 mg every 6 hours scheduled, add tizanidine  2 mg every 6 hours as needed, and encouraged use of as  needed oxycodone  prior to therapies  - 1/14: Increase PRN oxycodone  to 5-10 mg for moderate to severe pain; increase at bedtime gabapentin  to 600 mg to assist in at bedtime control  1-15: Using mostly oxycodone  10 overnight, pain remains 6-7 and interrupting sleep.  Results with increase gabapentin .  We discussed the patient today adding Elavil  versus scheduling tizanidine --will add Elavil  25 mg nightly.  1-16: Slept great with Elavil , continue current regimen  1-18: Doing well with current pain regimen, would start weaning OxyContin  this coming week  1/19 has not been using frequent oxycodone , reports pain is controlled.  Will continue current regimen for for today, could consider discontinue OxyContin  in the next day or two  - 1-22: DC OxyContin  this a.m.; monitor oxycodone  use over the next few days to gradually wean  1-23: Is using oxycodone  about once per day; reduce Robaxin  to 1000 mg 3 times daily, gabapentin  to 300 mg 3 times daily.  DC tizanidine .  Monitor oxycodone  use, will likely give short supply at discharge and then wean as outpatient.  4. Mood/Behavior/Sleep: LCSW to follow for evaluation and support when available.              -anxiety: d/t poor sleep, worry, and claustrophobia; denies nightmares or re-living traumatic event. Continue hydroxyzine  at bedtime.              -sleep: melatonin 5 mg at bedtime.   - 1-8: Not using as needed Atarax , continue nightly scheduled and DC 4 times daily as needed.  If anxiety ongoing limiting factor, will discuss initiation of BuSpar .  1/9-10: Started BuSpar  7.5 mg twice daily.  Encouraged use of as needed Atarax  at night if needed--monitor this weekend.  1/11- some improvement but will increase to 10mg  bid as anxiety still a factor --monitor 1 to 2 days on increased regimen 1-13: Patient endorses this is generally well-controlled, major  triggers using bedpan but otherwise doing better 1/14: Sleep interruptions due to pain, melatonin not  effective, change to PRN trazodone  50 mg at bedtime. Consider adding elavil  at bedtime if gabapentin  increase not effective.  1-15: Adding Elavil  25 mg nightly-did well with this, slightly groggy this morning but tolerable. --Mood/sleep stable on current regimen   5. Neuropsych/cognition: Psych consult ordered 1/6. Neuropsych Consult ordered in CIR.  This patient is capable of making decisions on his own behalf.   6. Skin/Wound Care: Routine pressure relief measures.               - 1 week post-op; will reach out to orthopedic surgical teams regarding timeline for suture/staple removal, RLE wrapping   -- Per Dr. Reyne, okay to remove sutures/staples. Ordered 1-8 - 1/9: Per Lauraine Moores, left humerus and right tibial incisions can be left open to air.  Per Dr. Josefina, right ankle dressing to remain intact unless saturated.   1-13: Remove remaining staples in the left proximal thigh and right knee--reminded nursing on 1-14 1-14: Ortho eval today:  LUE - ok to leave incision open to air LLE - ok to leave knee incision open to air RLE - Staples to be removed today and dressing change PRN 1-16: Nursing to remove residual staples/sutures on right ankle  1-22: Unwrapped and looked at bilateral lower extremities; all areas healing well    7. Fluids/Electrolytes/Nutrition: Monitor strict I&O and weights. Follow up labs CBC/CMP               -Regular diet + Ensure and vitamin supplements              - Good p.o. intakes overall   8.  Splenic laceration: 2 cm, monitor hemoglobin with CBC.   9.  Possible liver laceration: Small left zone 2/3 retroperitoneal hematoma.  Same as above   10.  ABLA: S/p 4 units PRBC, 1 unit whole blood, 2 unit FFP on 12/30.  Hemoglobin 8.9 from 8.8.  PLTs improved now to 63.               - 1-7: Stable on admission labs, follow-up labs   1-19 hemoglobin stable 10.4  11.  AKI: Related to hypovolemia on admission, resolved.  Encourage oral hydration. Monitor BMP                - 1/7: BUN/creatinine stable; monitor, stable at 1-7   - 1-12: A.m. labs stable, continue to encourage p.o. fluids, blood pressure holding up better   -1/19 BUN and creatinine stable today  12.  Tachycardia/hypertension: Continue Lopressor  25 mg twice daily.               - 1/7: Hypotensive with therapies + tachycardic - 500 cc IVF bolus today--responsive   - 1/8: Remains tachycardic 120s; titrate pain regimen as above; repeat orthostats and add 75 cc/hr drip if +; encourage PO fluids - may need to increase lopressor   1/11 HR a little better this morning (105) -continue lopressor  at 50mg  bid to see if this better controls without dropping bp too much -rx pain and anxiety as above 1/12-19: Improving with pain control, monitor. 1-20: Reducing pain medications tomorrow morning, monitor for recurrent tachycardia, then wean Lopressor  if tolerated. 1-22: Reduce Lopressor  to 25 mg twice daily 1-23: Lopressor  to 12.3 mg twice daily, if heart rate and blood pressure stays stable can DC Lopressor  1/24     02/18/2024    4:58 AM 02/17/2024  7:38 PM 02/17/2024    2:23 PM  Vitals with BMI  Systolic 111 135 886  Diastolic 66 72 83  Pulse 71 94 80      13.  Urinary retention: TOV failed 1/1 & 1/5. Foley replaced on 1/6 - Continue Flomax .  1/7 Flomax  moved to at bedtime due to orthostasis.  1/8: + BM yesterday; will plan foley trial Monday 1/12 --order 1/13-14: Continues with high volume output but has been continent of all voids, continue current regimen -1-15 urinary retention resolved, continent of all voids.  Will continue Flomax  for now given ongoing high-volume output and poor mobility. 1-17: High bladder scan yesterday, increase Flomax  to 0.8 mg after supper. 1-18: PVRs low.  Patient endorsing difficulty voiding only when he is laying down.  Encouraged upright seated position or bedside commode for voids.  DC PVRs. - Voiding continent on bedside commode on current regimen; would  not reduce Flomax  until mobility improves  14.  Constipation: received mag citrate 1/6 LBM unsure of success. KUB ordered.              -Miralax  increased to BID. Colace BID, as needed enema.              1/7: Straining with orthostasis - give bowel prep  1/8: medium BM with suppository and prep - schedule miralax  17 g BID, sennakot S 1 tab BID; encourage PO fluids as above  1-9: Increase Senokot to 2 tabs twice daily   - Multiple large BMs 1-11.   1-13: Patient endorses trouble using bedpan as huge source of anxiety, encouraged to practice transfers to assist in changing this. 1/14: Monitor through today; may need sorbitol if no BM tonight Last bowel movement 1-14, large.  Still requiring enema. 1-15: Increase Senokot-S to 3 tabs twice daily, continue MiraLAX  17 g twice daily, add as needed lactulose  10 g for moderate constipation.  1-17 medium bowel movement  1-18: 2 bowel movements yesterday, starting to pick up.  Reduce Senokot-S to 1 tab twice daily  Large bowel movement 1-22  15. Right 4 through 7, left 9 through 10 rib fractures w/pulmonary contusions: Continue pain control and pulmonary toilet.             - Encourage incentive spirometer and flutter valve. 16. L2-5 transverse process fractures - Pain control. 17. Right scapular spine fracture - Dr. Reyne consulted. This to be treated nonoperatively.  18. Right femur fracture - s/p IM rod 12/30 Dr. Reyne.   - 1/14: RLE knee immobilizer, ok for hip ROM; WBAT for transfers only in boot and KI  19. Open right tib/fib fracture - s/p IM rod right tibia 12/30 Dr. Reyne, ORIF R medial malleolus, Right partial patellectomy Dr. Josefina.   - 1/14: WBAT for tranfers only in boot and KI  20. Left humeral fracture - ORIF 12/31 Dr. Kendal.   - 1/14: WBAT w/ radial nerve palsy splint  21. Left femur fracture - s/p IM rod 12/30 Dr. Josefina. 22. Left foot fractures - s/p closed reduction left toes 12/30. 23. Left tibial plateau fracture -   washed out 12/30 Dr. Reyne, ORIF 12/31 Hr. Haddix.     - 1/14: LLE unrestricted ROM, NWB  24. Obesity. Body mass index is 32.13 kg/m. Complicates all aspects of care.   LOS: 17 days A FACE TO FACE EVALUATION WAS PERFORMED  Aaron Herrera Likes 02/18/2024, 3:48 PM     "

## 2024-02-18 NOTE — Progress Notes (Signed)
 Occupational Therapy Session Note  Patient Details  Name: Aaron Herrera MRN: [id number] Date of Birth: 2000-05-04  Today's Date: 02/18/2024 OT Individual Time: 1050-1200 OT Individual Time Calculation (min): 70 min   Today's Date: 02/18/2024 OT Individual Time: 8493-8462 OT Individual Time Calculation (min): 31 min    Short Term Goals: Week 3:  OT Short Term Goal 1 (Week 3): Pt will complete posterior pericare with Min A + PRN AE. OT Short Term Goal 2 (Week 3): Pt will perform sit<>stand with Mod A in preparation for standing ADLs. OT Short Term Goal 3 (Week 3): Pt will maintain static standce with Min A in preparation for standing ADLs.  Skilled Therapeutic Interventions/Progress Updates:   Session 1: Pt greeted sitting in [facility] for skilled OT session with focus on functional transfers, IADL participation, and discharge planning. The pt's mother was present throughout the session.  Pain: Pt with no reports of pain. OT offering intermediate rest breaks and positioning suggestions throughout session to address pain/fatigue and maximize participation/safety in session.   Functional Transfers: Pt performs slide-board transfer from WC<>edge of couch with OT stabilizing SB and WC, but supervision for movement. Pt educated on use of RLE for assist with transfer from edge of couch>WC due to incline. Pt also educated on positioning of the WC for BLE elevation when seated on the edge of couch.  Self Care Tasks/Education:  Pt managed WC parts (leg rests and brakes) with Min A to align pegs/lock R leg rest. Pt and mother were educated on the placement of household objects such as food items and the microwave on lower surfaces, making them more accessible from [facility] level. Pt was also educated on retrieval/transportation of food items from fridge, emphasis placed on transporting hot items with barrier for skin protection. Pt successfully simulated this task with min verbal cues for WC positioning and  technique, increased time required for maneuvering around tight spaces.   Pt propelled WC up/down a ramp in preparation for discharge. Pt required Min-A to push WC over the threshold and up the ramp. Pt verbalized preference to be forward-facing when propelling down the ramp, and required Min-A and min verbal cues to brake or slow down wheels during descent.  Pt remained sitting in WC with 4Ps assessed and immediate needs met. Pt continues to be appropriate for skilled OT intervention to promote further functional independence in ADLs/IADLs.   Session 2:  Pt greeted resting in bed for skilled OT session with focus on BADL retraining at EOB .   Pain: Pt with no reports of pain. OT offering intermediate rest breaks and positioning suggestions throughout session to address pain/fatigue and maximize participation/safety in session.   Functional Transfers: Supine>sit EOB with supervision, pt uses looped gait belt for RLE management. Now able to elevated BLE back into bed at supervision level with increased time.   Self Care Tasks: Pt completes the following self care tasks with levels of assistance noted below, UB: Setup A for changing of t-shirt.  LB: Pt able to doff/don LB garments with Mod A + lateral leans (use of reacher). Pt continues to experience difficulties with threading RLE (due to bracing) and hiking over sacral region due to material entanglement.    Education: Re-educated patient on need to wear splints when perform functional transfers to prevent further injury to L-wrist/digits.   Pt remained resting in bed with 4Ps assessed and immediate needs met. Pt continues to be appropriate for skilled OT intervention to promote further functional independence in  ADLs/IADLs.   Therapy Documentation Precautions:  Precautions Precautions: Fall Precaution/Restrictions Comments: watch BP Required Braces or Orthoses: Knee Immobilizer - Right, Other Brace Knee Immobilizer - Right: On at all  times Other Brace: R CAM boot, L post op shoe, L RNP splint, L wrist cock up splint Restrictions Weight Bearing Restrictions Per Provider Order: No RUE Weight Bearing Per Provider Order: Weight bearing as tolerated LUE Weight Bearing Per Provider Order: Weight bearing as tolerated RLE Weight Bearing Per Provider Order: Weight bearing as tolerated LLE Weight Bearing Per Provider Order: Non weight bearing Other Position/Activity Restrictions: no ROM of R knee, NO ROM restrictions L knee, no shoulder ROM restrictions   Therapy/Group: Individual Therapy  Nereida Habermann, OTR/L, MSOT  02/18/2024, 7:00 AM

## 2024-02-18 NOTE — Progress Notes (Signed)
 Physical Therapy Session Note  Patient Details  Name: Aaron Herrera MRN: [id number] Date of Birth: [birth year]  Today's Date: 02/18/2024 PT Individual Time: 9094-9054 + [id number] PT Individual Time Calculation (min): 40 min + 40 min  Short Term Goals: Week 3:  PT Short Term Goal 1 (Week 3): STG = LTG due to ELOS  Skilled Therapeutic Interventions/Progress Updates:    SESSION 1:  Pt presents in room in bed, agreeable to PT. Pt denies pain at rest. Session focused on transfer training for sit to stands to progress independence and upright tolerance for transfers. Pt completes bed mobility with supervision bordering on set up assist with pt requiring assist for placing strap around foot. Pt completes slideboard transfer supervision only with pt able to manage placing slideboard and scooting without assist. Pt able to manage leg rests with min assist for adjusting foot plate only. Pt transported to main gym dependently for time management and energy conservation. Pt completes slideboard transfer with supervision with same level of assist as above. Pt then completes x3 sit to stands from elevated EOM to RW with L hemi splint, requires mod assist x2 for first 2 trials, then mod assist x1 for last trial with pt able to maintain LLE NWB without assist!!! Pt maintains standing ~10 seconds with each trial, second trial pt completes marches on LLE x5. Pt requires extended seated rest breaks between trials, denies pain. Pt returns to room and remains seated in [facility] with all needs within reach, cal light in place at end of session.  SESSION 2: Pt presents in room in [facility] agreeable to PT. Pt does not report pain. Session focused on transfer training with and without BWS to improve upright tolerance. Pt transported to main gym and positioned for transfer, pt completes with supervision bordering on modI for set up. Pt centers self beneath maxi sky for BWS for transfers. Pt attaches harness with pt in sitting position  with max elevated EOM. Pt completes x5 sit to stands with BWS requires mod assist x2 for stand, difficulty achieving full standing position due to position of harness with pt demonstrating COG posterior to BOS, however pt able to trial stand pivot transfer movement with turning to transfer to [facility] on L (does not sit due to poor positioning of maxi sky) and turns back to R with stand pivot with maxi sky and RW, cues provided for sequencing and RW management. Pt then completes x3 sit to stands with mod assist x1 outside of maxi sky, first 2 trials pt able to maintain standing ~10 seconds, last trial pt prompted to complete BUE press ups on RW to promtoe BUE strenghtening and reliance for stand pivot transfer, pt able to complete x4 with heavy mod assist for posture. Pt completes slideboard transfer back to [facility] and transported back to room, completes transfer back to bed and bed mobility with distant supervision. Pt remains semi reclined with all needs wihtin reach, call light in place, bed alarm activated, ice placed on R knee at end of session.   Therapy Documentation Precautions:  Precautions Precautions: Fall Precaution/Restrictions Comments: watch BP Required Braces or Orthoses: Knee Immobilizer - Right, Other Brace Knee Immobilizer - Right: On at all times Other Brace: R CAM boot, L post op shoe, L RNP splint, L wrist cock up splint Restrictions Weight Bearing Restrictions Per Provider Order: No RUE Weight Bearing Per Provider Order: Weight bearing as tolerated LUE Weight Bearing Per Provider Order: Weight bearing as tolerated RLE Weight Bearing Per Provider  Order: Weight bearing as tolerated LLE Weight Bearing Per Provider Order: Non weight bearing Other Position/Activity Restrictions: no ROM of R knee, NO ROM restrictions L knee, no shoulder ROM restrictions   Therapy/Group: Individual Therapy  Reche Ohara PT, DPT 02/18/2024, 4:02 PM

## 2024-02-18 NOTE — Progress Notes (Signed)
 Patient ID: BARNEY RUSSOMANNO, male   DOB: 2000/09/19, 24 y.o.   MRN: [id number]   1203- SW returned phone call to pt father Christopher to answer questions in regards to discharge. He reports concerns about his wife not being able to help assist due to back issues and that is the primary concern that he has. Also confirms ramp may not be in until Friday. SW shared will need a date for ramp as d/c date has not changed at this time. SW discussed medical transport home and likely will get a bill later. He is in agreement with medical transport if needed He asks about follow-up care. SW shared ortho would be responsible for outpatient rehab referral when WB restrictions are lifted. No other questions/concerns reported.   Graeme Jude, MSW, LCSW Office: [phone] Cell: [phone] Fax: [phone]

## 2024-02-18 NOTE — Plan of Care (Signed)
" °  Problem: Consults Goal: RH GENERAL PATIENT EDUCATION Description: See Patient Education module for education specifics. Outcome: Progressing Goal: Skin Care Protocol Initiated - if Braden Score 18 or less Description: If consults are not indicated, leave blank or document N/A Outcome: Progressing Goal: Nutrition Consult-if indicated Outcome: Progressing   Problem: RH BOWEL ELIMINATION Goal: RH STG MANAGE BOWEL WITH ASSISTANCE Description: STG Manage Bowel with mod I Assistance. Outcome: Progressing Goal: RH STG MANAGE BOWEL W/MEDICATION W/ASSISTANCE Description: STG Manage Bowel with Medication with mod I Assistance. Outcome: Progressing   Problem: RH BLADDER ELIMINATION Goal: RH STG MANAGE BLADDER WITH ASSISTANCE Description: STG Manage Bladder With min  Assistance Outcome: Progressing Goal: RH STG MANAGE BLADDER WITH MEDICATION WITH ASSISTANCE Description: STG Manage Bladder With Medication With mod I Assistance. Outcome: Progressing Goal: RH STG MANAGE BLADDER WITH EQUIPMENT WITH ASSISTANCE Description: STG Manage Bladder With Equipment With min Assistance Outcome: Progressing   Problem: RH SAFETY Goal: RH STG ADHERE TO SAFETY PRECAUTIONS W/ASSISTANCE/DEVICE Description: STG Adhere to Safety Precautions With cues Assistance/Device. Outcome: Progressing   Problem: RH SKIN INTEGRITY Goal: RH STG SKIN FREE OF INFECTION/BREAKDOWN Description: Manage skin without infection with min assist Outcome: Progressing   Problem: RH PAIN MANAGEMENT Goal: RH STG PAIN MANAGED AT OR BELOW PT'S PAIN GOAL Description: Pain < 4 with prns Outcome: Progressing   "

## 2024-02-19 DIAGNOSIS — I10 Essential (primary) hypertension: Secondary | ICD-10-CM | POA: Diagnosis not present

## 2024-02-19 DIAGNOSIS — M25561 Pain in right knee: Secondary | ICD-10-CM

## 2024-02-19 DIAGNOSIS — K5900 Constipation, unspecified: Secondary | ICD-10-CM | POA: Diagnosis not present

## 2024-02-19 DIAGNOSIS — T07XXXA Unspecified multiple injuries, initial encounter: Secondary | ICD-10-CM | POA: Diagnosis not present

## 2024-02-19 DIAGNOSIS — M25562 Pain in left knee: Secondary | ICD-10-CM | POA: Diagnosis not present

## 2024-02-19 DIAGNOSIS — R339 Retention of urine, unspecified: Secondary | ICD-10-CM

## 2024-02-19 NOTE — Progress Notes (Signed)
 Orthopaedic Progress Note  S: Patient resting comfortably bed.  No complaints.  O:  Vitals:   02/18/24 2115 02/19/24 0400  BP: 122/74 132/80  Pulse: 88 73  Resp: 18 18  Temp: 98.4 F (36.9 C) 98.7 F (37.1 C)  SpO2: 98% 99%    Clean incisions on the left upper extremity and bilateral lower extremities.  Brace and boot are on the right leg.  Neurovascular intact in the bowel lower extremities and left upper extremity   Labs: No results found for this or any previous visit (from the past 24 hours).  Assessment: Status post ORIF left humerus, intramedullary fixation bilateral femurs and right tibia, partial patellectomy me right knee, ORIF right medial mall fracture, ORIF left tibial plateau fracture  Patient is doing well.  He is about 4 weeks from surgery.  He is still nonweightbearing other than for transfers on his right lower extremities.  We will wait another 2 weeks and obtain repeat x-rays and hopefully can advance his weightbearing at that time     Cordella Rhein, MD, MS [facility] [place] [phone]

## 2024-02-19 NOTE — Progress Notes (Signed)
 Occupational Therapy Session Note  Patient Details  Name: Aaron Herrera MRN: [id number] Date of Birth: Mar 23, 2000  Today's Date: 02/19/2024 OT Individual Time: 1417-1535 OT Individual Time Calculation (min): 78 min    Short Term Goals: Week 3:  OT Short Term Goal 1 (Week 3): Pt will complete posterior pericare with Min A + PRN AE. OT Short Term Goal 2 (Week 3): Pt will perform sit<>stand with Mod A in preparation for standing ADLs. OT Short Term Goal 3 (Week 3): Pt will maintain static standce with Min A in preparation for standing ADLs.  Skilled Therapeutic Interventions/Progress Updates:  Skilled OT session completed to address ADL retraining and functional mobility. Pt received supine in bed, agreeable to participate in therapy. Pt reports no pain. Pt independently placed/removed slideboard for all slideboard transfers this session.   Supine>EOB supervision with gait belt as leg lifter for RLE. EOB>WC slideboard transfer supervision, pt directing OT for correct placement of WC to reinforce carryover with directing care in home environment with family. Pt self-propelled to main gym with supervision for managing leg rests. Pt able to don/doff leg rests with Min A for RLE. WC>EOM slideboard with supervision. Pt completed toileting hygiene activity to remove 7 cards from bottom by lateral weight shifting to increase independence with posterior peri care. OT provided cones 18 in apart to replicate distance for peri care on DABSC. Pt successfully removed 7/7 cards. OT also provided education on increasing range for lateral weight shifting by dropping arm on DABSC and leaning over bed or WC, pt verbalized understanding. Pt completed LB dressing activity with reacher, pt endorsing difficulty with donning/doffing pants at RLE. OT provided intermittent touch A throughout to lift RLE when pt pulling up pants d/t size of pants. EOM>WC slideboard supervision. Pt self-propelled WC 265ft+ off unit  independently, pt did require Min A to propel d/t fatigue on return. Pt requested to void, continent of bladder. Toileting completed with Min A to manage brief when donning to pull up over bottom, pt displayed carryover by lateral leaning at bed to pull up. WC>EOB with supervision. EOB>Supine with Min A to lift RLE d/t fatigue. Pt supine in bed with bed alarm on and all needs within reach.   Therapy Documentation Precautions:  Precautions Precautions: Fall Precaution/Restrictions Comments: watch BP Required Braces or Orthoses: Knee Immobilizer - Right, Other Brace Knee Immobilizer - Right: On at all times Other Brace: R CAM boot, L post op shoe, L RNP splint, L wrist cock up splint Restrictions Weight Bearing Restrictions Per Provider Order: No RUE Weight Bearing Per Provider Order: Weight bearing as tolerated LUE Weight Bearing Per Provider Order: Weight bearing as tolerated RLE Weight Bearing Per Provider Order: Weight bearing as tolerated LLE Weight Bearing Per Provider Order: Non weight bearing Other Position/Activity Restrictions: no ROM of R knee, NO ROM restrictions L knee, no shoulder ROM restrictions   Therapy/Group: Individual Therapy  Shiro Ellerman T [place]-Chance 02/19/2024, 8:17 AM

## 2024-02-19 NOTE — Progress Notes (Signed)
 Occupational Therapy Session Note  Patient Details  Name: Aaron Herrera MRN: [id number] Date of Birth: [birth year]  Today's Date: 02/19/2024 OT Individual Time: 1000-1115 OT Individual Time Calculation (min): 75 min    Short Term Goals: Week 2:  OT Short Term Goal 1 (Week 2): Pt will perform toilet transfer with Mod A (x1). OT Short Term Goal 1 - Progress (Week 2): Met OT Short Term Goal 2 (Week 2): Pt will thread LB garments with Mod A + AE. OT Short Term Goal 2 - Progress (Week 2): Met OT Short Term Goal 3 (Week 2): Pt will roll laterally in bed with Mod A (x1) to decrease burden of care. OT Short Term Goal 3 - Progress (Week 2): Met Week 3:  OT Short Term Goal 1 (Week 3): Pt will complete posterior pericare with Min A + PRN AE. OT Short Term Goal 2 (Week 3): Pt will perform sit<>stand with Mod A in preparation for standing ADLs. OT Short Term Goal 3 (Week 3): Pt will maintain static standce with Min A in preparation for standing ADLs.  Skilled Therapeutic Interventions/Progress Updates:    Pt bed level at time of session with parents present in the room . No pain. Pt feeling urge to urinate and wanting to attempt from wheelchair. Supine > sit Supervision, using gait belt as leg lifter throughout transitional movements and transfers. Slide board bed > wheelchair with Supervision with pt placing board and managing all limbs. Extended time attempting to urinate, but unable despite positional changes. Set up at sink level for UB bathing an face washing, only needing set up. Self propel self room <> day room. Slide board wheelchair <> mat with Supervision and able to direct care. Seated 4# weighted ball overhead press, chest press, and woodchoppers each side for 1x10 each. Attempted to use Saebo with the following settings:  Saebo Stim One 330 pulse width 35 Hz pulse rate On 8 sec/ off 8 sec Ramp up/ down 2 sec Symmetrical Biphasic wave form Max intensity at 500 Ohm load  Note pt had more  wrist and digit flexion despite placement of saebo on wrist extensors and stopped activity. Pt self propel back to room, set up call bell in reach all needs met with parents in room.   Therapy Documentation Precautions:  Precautions Precautions: Fall Precaution/Restrictions Comments: watch BP Required Braces or Orthoses: Knee Immobilizer - Right, Other Brace Knee Immobilizer - Right: On at all times Other Brace: R CAM boot, L post op shoe, L RNP splint, L wrist cock up splint Restrictions Weight Bearing Restrictions Per Provider Order: No RUE Weight Bearing Per Provider Order: Weight bearing as tolerated LUE Weight Bearing Per Provider Order: Weight bearing as tolerated RLE Weight Bearing Per Provider Order: Weight bearing as tolerated LLE Weight Bearing Per Provider Order: Non weight bearing Other Position/Activity Restrictions: no ROM of R knee, NO ROM restrictions L knee, no shoulder ROM restrictions    Therapy/Group: Individual Therapy  Chiquita JAYSON Hopping 02/19/2024, 7:27 AM

## 2024-02-19 NOTE — Plan of Care (Signed)
" °  Problem: Consults Goal: RH GENERAL PATIENT EDUCATION Description: See Patient Education module for education specifics. Outcome: Progressing Goal: Skin Care Protocol Initiated - if Braden Score 18 or less Description: If consults are not indicated, leave blank or document N/A Outcome: Progressing Goal: Nutrition Consult-if indicated Outcome: Progressing   Problem: RH BOWEL ELIMINATION Goal: RH STG MANAGE BOWEL WITH ASSISTANCE Description: STG Manage Bowel with mod I Assistance. Outcome: Progressing Goal: RH STG MANAGE BOWEL W/MEDICATION W/ASSISTANCE Description: STG Manage Bowel with Medication with mod I Assistance. Outcome: Progressing   Problem: RH BLADDER ELIMINATION Goal: RH STG MANAGE BLADDER WITH ASSISTANCE Description: STG Manage Bladder With min  Assistance Outcome: Progressing Goal: RH STG MANAGE BLADDER WITH MEDICATION WITH ASSISTANCE Description: STG Manage Bladder With Medication With mod I Assistance. Outcome: Progressing Goal: RH STG MANAGE BLADDER WITH EQUIPMENT WITH ASSISTANCE Description: STG Manage Bladder With Equipment With min Assistance Outcome: Progressing   Problem: RH SKIN INTEGRITY Goal: RH STG SKIN FREE OF INFECTION/BREAKDOWN Description: Manage skin without infection with min assist Outcome: Progressing   Problem: RH SAFETY Goal: RH STG ADHERE TO SAFETY PRECAUTIONS W/ASSISTANCE/DEVICE Description: STG Adhere to Safety Precautions With cues Assistance/Device. Outcome: Progressing   Problem: RH PAIN MANAGEMENT Goal: RH STG PAIN MANAGED AT OR BELOW PT'S PAIN GOAL Description: Pain < 4 with prns Outcome: Progressing   "

## 2024-02-19 NOTE — Progress Notes (Addendum)
 "                                                        PROGRESS NOTE   Subjective/Complaints: No new complaints or concerns this morning.  Reports pain is under control.  LBM yesterday.    ROS: Patient denies fever, chills,  rash, sore throat, blurred vision, dizziness, nausea, vomiting, diarrhea, cough, shortness of breath or chest pain,   headache, or mood change.   Denies any motor or sensory changes.  + Bilateral knee pain    Objective:   No results found.   Recent Labs    02/17/24 0600  WBC 2.9*  HGB 11.1*  HCT 34.6*  PLT 353     No results for input(s): NA, K, CL, CO2, GLUCOSE, BUN, CREATININE, CALCIUM  in the last 72 hours.     Intake/Output Summary (Last 24 hours) at 02/19/2024 1433 Last data filed at 02/19/2024 0914 Gross per 24 hour  Intake 358 ml  Output 1400 ml  Net -1042 ml        Physical Exam: Vital Signs Blood pressure 132/80, pulse 73, temperature 98.7 F (37.1 C), temperature source Oral, resp. rate 18, height 5' 11 (1.803 m), weight 104.5 kg, SpO2 99%.  Constitutional: No distress . Vital signs reviewed.  Sitting in wheelchair appears comfortable   Cardiovascular: RRR without murmur. No JVD  .  Respiratory/Chest: CTA Bilaterally without wheezes or rales. Normal effort    GI/Abdomen: BS +, non-tender, non-distended Ext: no clubbing, cyanosis; trace right lower extremity edema Psych: pleasant, cooperative. Calm  Neurologic Exam:    Sensory exam: revealed normal sensation in all dermatomal regions in bilateral lower extremities, right upper extremity, and with reduced sensation to light touch in L dorsal thumb--ongoing    Motor exam:  strength 5/5 throughout right upper extremity  LUE 4/5 FF, 1/5 FA, 0/5 WE, 4/5 EF, 4/5 EE, 4/5 SA   Antigravity against resistance bilateral lower extremities, approximately 4/5, complicated by bracing   MSK:  no apparent deformity on exam.   Skin: Bilateral lower extremities covered in  gauze and wrapped in Ace dressing; right knee incision well-approximated with Steri-Strips; ankle incision lines are open to air, well-approximated, and healing.  No drainage, erythema, warmth, or dehiscence at surgical sites     Assessment/Plan: 1. Functional deficits which require 3+ hours per day of interdisciplinary therapy in a comprehensive inpatient rehab setting. Physiatrist is providing close team supervision and 24 hour management of active medical problems listed below. Physiatrist and rehab team continue to assess barriers to discharge/monitor patient progress toward functional and medical goals  Care Tool:  Bathing    Body parts bathed by patient: Left arm, Chest, Abdomen, Front perineal area, Right upper leg, Left upper leg, Right arm, Left lower leg, Face   Body parts bathed by helper: Buttocks Body parts n/a: Right lower leg   Bathing assist Assist Level: Minimal Assistance - Patient > 75%     Upper Body Dressing/Undressing Upper body dressing   What is the patient wearing?: Pull over shirt    Upper body assist Assist Level: Set up assist    Lower Body Dressing/Undressing Lower body dressing      What is the patient wearing?: Pants, Incontinence brief     Lower body assist Assist for lower body dressing: Moderate  Assistance - Patient 50 - 74%     Toileting Toileting    Toileting assist Assist for toileting: 2 Helpers     Transfers Chair/bed transfer  Transfers assist  Chair/bed transfer activity did not occur: Safety/medical concerns (orthostatic hypotension)  Chair/bed transfer assist level: Total Assistance - Patient < 25%     Locomotion Ambulation   Ambulation assist   Ambulation activity did not occur: Safety/medical concerns          Walk 10 feet activity   Assist  Walk 10 feet activity did not occur: Safety/medical concerns        Walk 50 feet activity   Assist Walk 50 feet with 2 turns activity did not occur:  Safety/medical concerns         Walk 150 feet activity   Assist Walk 150 feet activity did not occur: Safety/medical concerns         Walk 10 feet on uneven surface  activity   Assist Walk 10 feet on uneven surfaces activity did not occur: Safety/medical concerns         Wheelchair     Assist Is the patient using a wheelchair?: Yes Type of Wheelchair: Manual Wheelchair activity did not occur: Safety/medical concerns         Wheelchair 50 feet with 2 turns activity    Assist    Wheelchair 50 feet with 2 turns activity did not occur: Safety/medical concerns       Wheelchair 150 feet activity     Assist  Wheelchair 150 feet activity did not occur: Safety/medical concerns       Blood pressure 132/80, pulse 73, temperature 98.7 F (37.1 C), temperature source Oral, resp. rate 18, height 5' 11 (1.803 m), weight 104.5 kg, SpO2 99%.  Medical Problem List and Plan: 1. Functional deficits secondary to polytrauma             -patient may shower with surgical dressings covered             -ELOS/Goals: 12-16 days, Mod A PT/OT at [facility] level - 02/23/24, pending WC ramp being built -Continue CIR therapies including PT, OT,SLP    Weight bearing: WBAT B/L upper extremities. May weight bear on right leg for transfers in boot and knee immobilizer NWB Left leg and should wear post op shoe on left foot when transferring    Splinting: L radial nerve palsy splint (finger bands) during daytime hours PRN for exercise and functional tasks; SLEEP in black wrist cock-up splint and wear PRN during the day when not wearing other splint to prevent wrist drop.    - 1/13: DC home with parents, mom primary caregiver and concerned that she can;t Do much; dad works during daytime. Max A bed mobility very limited by pain and anxiety - up to EOB yesterday - trying slideboard transfer today.  Min A UB, Max A LB care. Downgrading goals to Min A. SLP mild cognitive deficits  - SPV to  ind with memory as biuggest deficit.    -1-15: Weightbearing status per orthopedics as below.    To follow-up with Dr. Kendal 2 weeks after DC for x-rays of the left humerus and left knee, and Dr.Gebauer/Landau for bilateral tibia left femur and left toe fractures  -1/20: Mom working on remodeling their home, widening doorways for his WC. SPV bed mobility, Min A slideboard transfers due to need to place but once down he is supervision, SPV WC ambulation, STS Max A 2x yesterday. Goal SPT  for car and bed transfers. Setup UB care, Mod-Max A LB care due to pain but is doing well with adaptive techniques with the reacher this week. [employer] putting in temporary ramp--discussed keeping in touch with us  on a date that this will be completed to help coordinate discharge plans.  - 1-23: Car transfer not feasible due to height; arranging medical transport for discharge.   2.  Antithrombotics: -DVT/anticoagulation:  Mechanical: Sequential compression devices, below knee Bilateral lower extremities Pharmaceutical: Lovenox              -antiplatelet therapy: n/a             -BL LE duplex dopplers negative   - 1-15 per Ortho, transition to Eliquis  2.5 mg twice daily for 30 days for DVT prophylaxis at discharge - will start transition to Eliquis  today for patient comfort on 1-17; last Lovenox  dose tonight 1/16--tolerating   3. Pain Management: Tylenol  1000 mg q6h, gabapentin  300 mg 3 times daily.  Oxycodone  10 mg q4h and  Robaxin  500 mg q6 PRN.   1/8: Reduce tylenol  to 1000 mg TID, schedule robaxin  500 mg QID, schedule oxycodone  5 mg QID and reduce PRN to 5 mg Q6H PRN  1/11 still chasing pain at times. Pain wakes him up overnight   -will change scheduled oxycodone  5 q6 to oxycontin , titrate that if needed.   - 1-12: Monitor on OxyContin   - 1-13: Increase Robaxin  to 1000 mg every 6 hours scheduled, add tizanidine  2 mg every 6 hours as needed, and encouraged use of as needed oxycodone  prior to therapies  -  1/14: Increase PRN oxycodone  to 5-10 mg for moderate to severe pain; increase at bedtime gabapentin  to 600 mg to assist in at bedtime control  1-15: Using mostly oxycodone  10 overnight, pain remains 6-7 and interrupting sleep.  Results with increase gabapentin .  We discussed the patient today adding Elavil  versus scheduling tizanidine --will add Elavil  25 mg nightly.  1-16: Slept great with Elavil , continue current regimen  1-18: Doing well with current pain regimen, would start weaning OxyContin  this coming week  1/19 has not been using frequent oxycodone , reports pain is controlled.  Will continue current regimen for for today, could consider discontinue OxyContin  in the next day or two  - 1-22: DC OxyContin  this a.m.; monitor oxycodone  use over the next few days to gradually wean  1-23: Is using oxycodone  about once per day; reduce Robaxin  to 1000 mg 3 times daily, gabapentin  to 300 mg 3 times daily.  DC tizanidine .  Monitor oxycodone  use, will likely give short supply at discharge and then wean as outpatient.  -1/24 only used oxycodone  once last night and once earlier this morning.  Continue current regimen and monitor  4. Mood/Behavior/Sleep: LCSW to follow for evaluation and support when available.              -anxiety: d/t poor sleep, worry, and claustrophobia; denies nightmares or re-living traumatic event. Continue hydroxyzine  at bedtime.              -sleep: melatonin 5 mg at bedtime.   - 1-8: Not using as needed Atarax , continue nightly scheduled and DC 4 times daily as needed.  If anxiety ongoing limiting factor, will discuss initiation of BuSpar .  1/9-10: Started BuSpar  7.5 mg twice daily.  Encouraged use of as needed Atarax  at night if needed--monitor this weekend.  1/11- some improvement but will increase to 10mg  bid as anxiety still a factor --monitor 1 to 2 days  on increased regimen 1-13: Patient endorses this is generally well-controlled, major triggers using bedpan but otherwise  doing better 1/14: Sleep interruptions due to pain, melatonin not effective, change to PRN trazodone  50 mg at bedtime. Consider adding elavil  at bedtime if gabapentin  increase not effective.  1-15: Adding Elavil  25 mg nightly-did well with this, slightly groggy this morning but tolerable. --Mood/sleep stable on current regimen   5. Neuropsych/cognition: Psych consult ordered 1/6. Neuropsych Consult ordered in CIR.  This patient is capable of making decisions on his own behalf.   6. Skin/Wound Care: Routine pressure relief measures.               - 1 week post-op; will reach out to orthopedic surgical teams regarding timeline for suture/staple removal, RLE wrapping   -- Per Dr. Reyne, okay to remove sutures/staples. Ordered 1-8 - 1/9: Per Lauraine Moores, left humerus and right tibial incisions can be left open to air.  Per Dr. Josefina, right ankle dressing to remain intact unless saturated.   1-13: Remove remaining staples in the left proximal thigh and right knee--reminded nursing on 1-14 1-14: Ortho eval today:  LUE - ok to leave incision open to air LLE - ok to leave knee incision open to air RLE - Staples to be removed today and dressing change PRN 1-16: Nursing to remove residual staples/sutures on right ankle  1-22: Unwrapped and looked at bilateral lower extremities; all areas healing well    7. Fluids/Electrolytes/Nutrition: Monitor strict I&O and weights. Follow up labs CBC/CMP               -Regular diet + Ensure and vitamin supplements              - Good p.o. intakes overall   8.  Splenic laceration: 2 cm, monitor hemoglobin with CBC.   9.  Possible liver laceration: Small left zone 2/3 retroperitoneal hematoma.  Same as above   10.  ABLA: S/p 4 units PRBC, 1 unit whole blood, 2 unit FFP on 12/30.  Hemoglobin 8.9 from 8.8.  PLTs improved now to 63.               - 1-7: Stable on admission labs, follow-up labs   1-19 hemoglobin stable 10.4  11.  AKI: Related to hypovolemia  on admission, resolved.  Encourage oral hydration. Monitor BMP               - 1/7: BUN/creatinine stable; monitor, stable at 1-7   - 1-12: A.m. labs stable, continue to encourage p.o. fluids, blood pressure holding up better   -1/19 BUN and creatinine stable today  12.  Tachycardia/hypertension: Continue Lopressor  25 mg twice daily.               - 1/7: Hypotensive with therapies + tachycardic - 500 cc IVF bolus today--responsive   - 1/8: Remains tachycardic 120s; titrate pain regimen as above; repeat orthostats and add 75 cc/hr drip if +; encourage PO fluids - may need to increase lopressor   1/11 HR a little better this morning (105) -continue lopressor  at 50mg  bid to see if this better controls without dropping bp too much -rx pain and anxiety as above 1/12-19: Improving with pain control, monitor. 1-20: Reducing pain medications tomorrow morning, monitor for recurrent tachycardia, then wean Lopressor  if tolerated. 1-22: Reduce Lopressor  to 25 mg twice daily 1-23: Lopressor  to 12.3 mg twice daily, if heart rate and blood pressure stays stable can DC Lopressor  1/24 -1/24 stable, DC  Lopressor      02/19/2024    4:00 AM 02/18/2024    9:15 PM 02/18/2024    4:58 AM  Vitals with BMI  Systolic 132 122 888  Diastolic 80 74 66  Pulse 73 88 71      13.  Urinary retention: TOV failed 1/1 & 1/5. Foley replaced on 1/6 - Continue Flomax .  1/7 Flomax  moved to at bedtime due to orthostasis.  1/8: + BM yesterday; will plan foley trial Monday 1/12 --order 1/13-14: Continues with high volume output but has been continent of all voids, continue current regimen -1-15 urinary retention resolved, continent of all voids.  Will continue Flomax  for now given ongoing high-volume output and poor mobility. 1-17: High bladder scan yesterday, increase Flomax  to 0.8 mg after supper. 1-18: PVRs low.  Patient endorsing difficulty voiding only when he is laying down.  Encouraged upright seated position or bedside  commode for voids.  DC PVRs. - Voiding continent on bedside commode on current regimen; would not reduce Flomax  until mobility improves -1/24 remains continent, denies symptoms continue current regimen  14.  Constipation: received mag citrate 1/6 LBM unsure of success. KUB ordered.              -Miralax  increased to BID. Colace BID, as needed enema.              1/7: Straining with orthostasis - give bowel prep  1/8: medium BM with suppository and prep - schedule miralax  17 g BID, sennakot S 1 tab BID; encourage PO fluids as above  1-9: Increase Senokot to 2 tabs twice daily   - Multiple large BMs 1-11.   1-13: Patient endorses trouble using bedpan as huge source of anxiety, encouraged to practice transfers to assist in changing this. 1/14: Monitor through today; may need sorbitol if no BM tonight Last bowel movement 1-14, large.  Still requiring enema. 1-15: Increase Senokot-S to 3 tabs twice daily, continue MiraLAX  17 g twice daily, add as needed lactulose  10 g for moderate constipation.  1-17 medium bowel movement  1-18: 2 bowel movements yesterday, starting to pick up.  Reduce Senokot-S to 1 tab twice daily  Large bowel movement 1-23  15. Right 4 through 7, left 9 through 10 rib fractures w/pulmonary contusions: Continue pain control and pulmonary toilet.             - Encourage incentive spirometer and flutter valve. 16. L2-5 transverse process fractures - Pain control. 17. Right scapular spine fracture - Dr. Reyne consulted. This to be treated nonoperatively.  18. Right femur fracture - s/p IM rod 12/30 Dr. Reyne.   - 1/14: RLE knee immobilizer, ok for hip ROM; WBAT for transfers only in boot and KI  19. Open right tib/fib fracture - s/p IM rod right tibia 12/30 Dr. Reyne, ORIF R medial malleolus, Right partial patellectomy Dr. Josefina.   - 1/14: WBAT for tranfers only in boot and KI  20. Left humeral fracture - ORIF 12/31 Dr. Kendal.   - 1/14: WBAT w/ radial nerve palsy  splint  21. Left femur fracture - s/p IM rod 12/30 Dr. Josefina. 22. Left foot fractures - s/p closed reduction left toes 12/30. 23. Left tibial plateau fracture -  washed out 12/30 Dr. Reyne, ORIF 12/31 Hr. Haddix.     - 1/14: LLE unrestricted ROM, NWB  24. Obesity. Body mass index is 32.13 kg/m. Complicates all aspects of care.   LOS: 18 days A FACE TO FACE EVALUATION WAS PERFORMED  Murray Collier 02/19/2024, 2:33 PM     "

## 2024-02-19 NOTE — Plan of Care (Signed)
" °  Problem: RH BLADDER ELIMINATION Goal: RH STG MANAGE BLADDER WITH EQUIPMENT WITH ASSISTANCE Description: STG Manage Bladder With Equipment With min Assistance Outcome: Progressing   Problem: RH SKIN INTEGRITY Goal: RH STG SKIN FREE OF INFECTION/BREAKDOWN Description: Manage skin without infection with min assist Outcome: Progressing   Problem: RH SAFETY Goal: RH STG ADHERE TO SAFETY PRECAUTIONS W/ASSISTANCE/DEVICE Description: STG Adhere to Safety Precautions With cues Assistance/Device. Outcome: Progressing   Problem: RH PAIN MANAGEMENT Goal: RH STG PAIN MANAGED AT OR BELOW PT'S PAIN GOAL Description: Pain < 4 with prns Outcome: Progressing   "

## 2024-02-20 DIAGNOSIS — R339 Retention of urine, unspecified: Secondary | ICD-10-CM | POA: Diagnosis not present

## 2024-02-20 DIAGNOSIS — K5900 Constipation, unspecified: Secondary | ICD-10-CM | POA: Diagnosis not present

## 2024-02-20 DIAGNOSIS — T07XXXA Unspecified multiple injuries, initial encounter: Secondary | ICD-10-CM | POA: Diagnosis not present

## 2024-02-20 DIAGNOSIS — M25561 Pain in right knee: Secondary | ICD-10-CM | POA: Diagnosis not present

## 2024-02-20 NOTE — Progress Notes (Signed)
 Occupational Therapy Session Note  Patient Details  Name: Aaron Herrera MRN: [id number] Date of Birth: 05/14/2000  {CHL IP REHAB OT TIME CALCULATIONS:[id number]}   Short Term Goals: Week 3:  OT Short Term Goal 1 (Week 3): Pt will complete posterior pericare with Min A + PRN AE. OT Short Term Goal 2 (Week 3): Pt will perform sit<>stand with Mod A in preparation for standing ADLs. OT Short Term Goal 3 (Week 3): Pt will maintain static standce with Min A in preparation for standing ADLs.  Skilled Therapeutic Interventions/Progress Updates:   Pt greeted *** for skilled OT session with focus on ***.   Pain: Pt reported ***/10 pain, stating *** in reference to ***. OT offering intermediate rest breaks and positioning suggestions throughout session to address pain/fatigue and maximize participation/safety in session.   Functional Transfers:  Self Care Tasks: Pt completes the following self care tasks with levels of assistance noted below, UB: LB:   Therapeutic Activities:  Therapeutic Exercise:   Education:  Pt remained *** with 4Ps assessed and immediate needs met. Pt continues to be appropriate for skilled OT intervention to promote further functional independence in ADLs/IADLs.    Therapy Documentation Precautions:  Precautions Precautions: Fall Precaution/Restrictions Comments: watch BP Required Braces or Orthoses: Knee Immobilizer - Right, Other Brace Knee Immobilizer - Right: On at all times Other Brace: R CAM boot, L post op shoe, L RNP splint, L wrist cock up splint Restrictions Weight Bearing Restrictions Per Provider Order: No RUE Weight Bearing Per Provider Order: Weight bearing as tolerated LUE Weight Bearing Per Provider Order: Weight bearing as tolerated RLE Weight Bearing Per Provider Order: Weight bearing as tolerated LLE Weight Bearing Per Provider Order: Non weight bearing Other Position/Activity Restrictions: no ROM of R knee, NO ROM restrictions L knee,  no shoulder ROM restrictions   Therapy/Group: Individual Therapy  Nereida Habermann, OTR/L, MSOT  02/20/2024, 6:12 PM

## 2024-02-20 NOTE — Progress Notes (Signed)
 "                                                        PROGRESS NOTE   Subjective/Complaints: No new complaints or concerns today.  No acute events noted overnight.  Reports pain is under control  ROS: Patient denies fever, chills,  rash, sore throat, blurred vision, dizziness, nausea, vomiting, diarrhea, cough, shortness of breath or chest pain,   headache, or mood change.   Denies any motor or sensory changes.  + Bilateral knee pain-controlled    Objective:   No results found.   No results for input(s): WBC, HGB, HCT, PLT in the last 72 hours.    No results for input(s): NA, K, CL, CO2, GLUCOSE, BUN, CREATININE, CALCIUM  in the last 72 hours.     Intake/Output Summary (Last 24 hours) at 02/20/2024 1240 Last data filed at 02/20/2024 0831 Gross per 24 hour  Intake 1017 ml  Output 2625 ml  Net -1608 ml        Physical Exam: Vital Signs Blood pressure 129/81, pulse 81, temperature 98.2 F (36.8 C), temperature source Oral, resp. rate 19, height 5' 11 (1.803 m), weight 104.5 kg, SpO2 98%.  Constitutional: No distress . Vital signs reviewed.  Laying in bed using his tablet appears comfortable.   Cardiovascular: RRR without murmur. No JVD  .  Respiratory/Chest: CTA Bilaterally without wheezes or rales. Normal effort    GI/Abdomen: BS +, non-tender, non-distended Ext: no clubbing, cyanosis; trace right lower extremity edema Psych: pleasant, cooperative. Calm  Neurologic Exam:    Sensory exam: revealed normal sensation in all dermatomal regions in bilateral lower extremities, right upper extremity, and with reduced sensation to light touch in L dorsal thumb--ongoing    Motor exam:  strength 5/5 throughout right upper extremity  LUE 4/5 FF, 1/5 FA, 0/5 WE, 4/5 EF, 4/5 EE, 4/5 SA   Antigravity against resistance bilateral lower extremities, approximately 4/5, complicated by bracing   MSK:  no apparent deformity on exam.   Skin: Bilateral  lower extremities covered in gauze and wrapped in Ace dressing; right knee incision well-approximated with Steri-Strips; ankle incision lines are open to air, well-approximated, and healing.  No drainage, erythema, warmth, or dehiscence at surgical sites     Assessment/Plan: 1. Functional deficits which require 3+ hours per day of interdisciplinary therapy in a comprehensive inpatient rehab setting. Physiatrist is providing close team supervision and 24 hour management of active medical problems listed below. Physiatrist and rehab team continue to assess barriers to discharge/monitor patient progress toward functional and medical goals  Care Tool:  Bathing    Body parts bathed by patient: Left arm, Chest, Abdomen, Front perineal area, Right upper leg, Left upper leg, Right arm, Left lower leg, Face   Body parts bathed by helper: Buttocks Body parts n/a: Right lower leg   Bathing assist Assist Level: Minimal Assistance - Patient > 75%     Upper Body Dressing/Undressing Upper body dressing   What is the patient wearing?: Pull over shirt    Upper body assist Assist Level: Set up assist    Lower Body Dressing/Undressing Lower body dressing      What is the patient wearing?: Pants, Incontinence brief     Lower body assist Assist for lower body dressing: Moderate Assistance - Patient 50 -  74%     Toileting Toileting    Toileting assist Assist for toileting: 2 Helpers     Transfers Chair/bed transfer  Transfers assist  Chair/bed transfer activity did not occur: Safety/medical concerns (orthostatic hypotension)  Chair/bed transfer assist level: Total Assistance - Patient < 25%     Locomotion Ambulation   Ambulation assist   Ambulation activity did not occur: Safety/medical concerns          Walk 10 feet activity   Assist  Walk 10 feet activity did not occur: Safety/medical concerns        Walk 50 feet activity   Assist Walk 50 feet with 2 turns  activity did not occur: Safety/medical concerns         Walk 150 feet activity   Assist Walk 150 feet activity did not occur: Safety/medical concerns         Walk 10 feet on uneven surface  activity   Assist Walk 10 feet on uneven surfaces activity did not occur: Safety/medical concerns         Wheelchair     Assist Is the patient using a wheelchair?: Yes Type of Wheelchair: Manual Wheelchair activity did not occur: Safety/medical concerns         Wheelchair 50 feet with 2 turns activity    Assist    Wheelchair 50 feet with 2 turns activity did not occur: Safety/medical concerns       Wheelchair 150 feet activity     Assist  Wheelchair 150 feet activity did not occur: Safety/medical concerns       Blood pressure 129/81, pulse 81, temperature 98.2 F (36.8 C), temperature source Oral, resp. rate 19, height 5' 11 (1.803 m), weight 104.5 kg, SpO2 98%.  Medical Problem List and Plan: 1. Functional deficits secondary to polytrauma             -patient may shower with surgical dressings covered             -ELOS/Goals: 12-16 days, Mod A PT/OT at [facility] level - 02/23/24, pending WC ramp being built -Continue CIR therapies including PT, OT,SLP    Weight bearing: WBAT B/L upper extremities. May weight bear on right leg for transfers in boot and knee immobilizer NWB Left leg and should wear post op shoe on left foot when transferring    Splinting: L radial nerve palsy splint (finger bands) during daytime hours PRN for exercise and functional tasks; SLEEP in black wrist cock-up splint and wear PRN during the day when not wearing other splint to prevent wrist drop.    - 1/13: DC home with parents, mom primary caregiver and concerned that she can;t Do much; dad works during daytime. Max A bed mobility very limited by pain and anxiety - up to EOB yesterday - trying slideboard transfer today.  Min A UB, Max A LB care. Downgrading goals to Min A. SLP mild  cognitive deficits  - SPV to ind with memory as biuggest deficit.    -1-15: Weightbearing status per orthopedics as below.    To follow-up with Dr. Kendal 2 weeks after DC for x-rays of the left humerus and left knee, and Dr.Gebauer/Landau for bilateral tibia left femur and left toe fractures  -1/20: Mom working on remodeling their home, widening doorways for his WC. SPV bed mobility, Min A slideboard transfers due to need to place but once down he is supervision, SPV WC ambulation, STS Max A 2x yesterday. Goal SPT for car and bed transfers.  Setup UB care, Mod-Max A LB care due to pain but is doing well with adaptive techniques with the reacher this week. [employer] putting in temporary ramp--discussed keeping in touch with us  on a date that this will be completed to help coordinate discharge plans.  - 1-23: Car transfer not feasible due to height; arranging medical transport for discharge.   2.  Antithrombotics: -DVT/anticoagulation:  Mechanical: Sequential compression devices, below knee Bilateral lower extremities Pharmaceutical: Lovenox              -antiplatelet therapy: n/a             -BL LE duplex dopplers negative   - 1-15 per Ortho, transition to Eliquis  2.5 mg twice daily for 30 days for DVT prophylaxis at discharge - will start transition to Eliquis  today for patient comfort on 1-17; last Lovenox  dose tonight 1/16--tolerating   3. Pain Management: Tylenol  1000 mg q6h, gabapentin  300 mg 3 times daily.  Oxycodone  10 mg q4h and  Robaxin  500 mg q6 PRN.   1/8: Reduce tylenol  to 1000 mg TID, schedule robaxin  500 mg QID, schedule oxycodone  5 mg QID and reduce PRN to 5 mg Q6H PRN  1/11 still chasing pain at times. Pain wakes him up overnight   -will change scheduled oxycodone  5 q6 to oxycontin , titrate that if needed.   - 1-12: Monitor on OxyContin   - 1-13: Increase Robaxin  to 1000 mg every 6 hours scheduled, add tizanidine  2 mg every 6 hours as needed, and encouraged use of as needed  oxycodone  prior to therapies  - 1/14: Increase PRN oxycodone  to 5-10 mg for moderate to severe pain; increase at bedtime gabapentin  to 600 mg to assist in at bedtime control  1-15: Using mostly oxycodone  10 overnight, pain remains 6-7 and interrupting sleep.  Results with increase gabapentin .  We discussed the patient today adding Elavil  versus scheduling tizanidine --will add Elavil  25 mg nightly.  1-16: Slept great with Elavil , continue current regimen  1-18: Doing well with current pain regimen, would start weaning OxyContin  this coming week  1/19 has not been using frequent oxycodone , reports pain is controlled.  Will continue current regimen for for today, could consider discontinue OxyContin  in the next day or two  - 1-22: DC OxyContin  this a.m.; monitor oxycodone  use over the next few days to gradually wean  1-23: Is using oxycodone  about once per day; reduce Robaxin  to 1000 mg 3 times daily, gabapentin  to 300 mg 3 times daily.  DC tizanidine .  Monitor oxycodone  use, will likely give short supply at discharge and then wean as outpatient.  -1/24 only used oxycodone  once last night and once earlier this morning.  Continue current regimen and monitor  -1/25 pain overall controlled, using as needed oxycodone  about daily  4. Mood/Behavior/Sleep: LCSW to follow for evaluation and support when available.              -anxiety: d/t poor sleep, worry, and claustrophobia; denies nightmares or re-living traumatic event. Continue hydroxyzine  at bedtime.              -sleep: melatonin 5 mg at bedtime.   - 1-8: Not using as needed Atarax , continue nightly scheduled and DC 4 times daily as needed.  If anxiety ongoing limiting factor, will discuss initiation of BuSpar .  1/9-10: Started BuSpar  7.5 mg twice daily.  Encouraged use of as needed Atarax  at night if needed--monitor this weekend.  1/11- some improvement but will increase to 10mg  bid as anxiety still a  factor --monitor 1 to 2 days on increased  regimen 1-13: Patient endorses this is generally well-controlled, major triggers using bedpan but otherwise doing better 1/14: Sleep interruptions due to pain, melatonin not effective, change to PRN trazodone  50 mg at bedtime. Consider adding elavil  at bedtime if gabapentin  increase not effective.  1-15: Adding Elavil  25 mg nightly-did well with this, slightly groggy this morning but tolerable. --Mood/sleep stable on current regimen   5. Neuropsych/cognition: Psych consult ordered 1/6. Neuropsych Consult ordered in CIR.  This patient is capable of making decisions on his own behalf.   6. Skin/Wound Care: Routine pressure relief measures.               - 1 week post-op; will reach out to orthopedic surgical teams regarding timeline for suture/staple removal, RLE wrapping   -- Per Dr. Reyne, okay to remove sutures/staples. Ordered 1-8 - 1/9: Per Lauraine Moores, left humerus and right tibial incisions can be left open to air.  Per Dr. Josefina, right ankle dressing to remain intact unless saturated.   1-13: Remove remaining staples in the left proximal thigh and right knee--reminded nursing on 1-14 1-14: Ortho eval today:  LUE - ok to leave incision open to air LLE - ok to leave knee incision open to air RLE - Staples to be removed today and dressing change PRN 1-16: Nursing to remove residual staples/sutures on right ankle  1-22: Unwrapped and looked at bilateral lower extremities; all areas healing well    7. Fluids/Electrolytes/Nutrition: Monitor strict I&O and weights. Follow up labs CBC/CMP               -Regular diet + Ensure and vitamin supplements              - Good p.o. intakes overall   8.  Splenic laceration: 2 cm, monitor hemoglobin with CBC.   9.  Possible liver laceration: Small left zone 2/3 retroperitoneal hematoma.  Same as above   10.  ABLA: S/p 4 units PRBC, 1 unit whole blood, 2 unit FFP on 12/30.  Hemoglobin 8.9 from 8.8.  PLTs improved now to 63.               -  1-7: Stable on admission labs, follow-up labs   1-19 hemoglobin stable 10.4  11.  AKI: Related to hypovolemia on admission, resolved.  Encourage oral hydration. Monitor BMP               - 1/7: BUN/creatinine stable; monitor, stable at 1-7   - 1-12: A.m. labs stable, continue to encourage p.o. fluids, blood pressure holding up better   -1/19 BUN and creatinine stable today  12.  Tachycardia/hypertension: Continue Lopressor  25 mg twice daily.               - 1/7: Hypotensive with therapies + tachycardic - 500 cc IVF bolus today--responsive   - 1/8: Remains tachycardic 120s; titrate pain regimen as above; repeat orthostats and add 75 cc/hr drip if +; encourage PO fluids - may need to increase lopressor   1/11 HR a little better this morning (105) -continue lopressor  at 50mg  bid to see if this better controls without dropping bp too much -rx pain and anxiety as above 1/12-19: Improving with pain control, monitor. 1-20: Reducing pain medications tomorrow morning, monitor for recurrent tachycardia, then wean Lopressor  if tolerated. 1-22: Reduce Lopressor  to 25 mg twice daily 1-23: Lopressor  to 12.3 mg twice daily, if heart rate and blood pressure stays stable can  DC Lopressor  1/24 -1/24 stable, DC Lopressor  -1/25 heart rate stable continue current regimen and monitor     02/19/2024    9:17 PM 02/19/2024    4:07 PM 02/19/2024    4:00 AM  Vitals with BMI  Systolic 129 126 867  Diastolic 81 72 80  Pulse 81 84 73      13.  Urinary retention: TOV failed 1/1 & 1/5. Foley replaced on 1/6 - Continue Flomax .  1/7 Flomax  moved to at bedtime due to orthostasis.  1/8: + BM yesterday; will plan foley trial Monday 1/12 --order 1/13-14: Continues with high volume output but has been continent of all voids, continue current regimen -1-15 urinary retention resolved, continent of all voids.  Will continue Flomax  for now given ongoing high-volume output and poor mobility. 1-17: High bladder scan  yesterday, increase Flomax  to 0.8 mg after supper. 1-18: PVRs low.  Patient endorsing difficulty voiding only when he is laying down.  Encouraged upright seated position or bedside commode for voids.  DC PVRs. - Voiding continent on bedside commode on current regimen; would not reduce Flomax  until mobility improves -1/24 remains continent, denies symptoms continue current regimen  14.  Constipation: received mag citrate 1/6 LBM unsure of success. KUB ordered.              -Miralax  increased to BID. Colace BID, as needed enema.              1/7: Straining with orthostasis - give bowel prep  1/8: medium BM with suppository and prep - schedule miralax  17 g BID, sennakot S 1 tab BID; encourage PO fluids as above  1-9: Increase Senokot to 2 tabs twice daily   - Multiple large BMs 1-11.   1-13: Patient endorses trouble using bedpan as huge source of anxiety, encouraged to practice transfers to assist in changing this. 1/14: Monitor through today; may need sorbitol if no BM tonight Last bowel movement 1-14, large.  Still requiring enema. 1-15: Increase Senokot-S to 3 tabs twice daily, continue MiraLAX  17 g twice daily, add as needed lactulose  10 g for moderate constipation.  1-17 medium bowel movement  1-18: 2 bowel movements yesterday, starting to pick up.  Reduce Senokot-S to 1 tab twice daily  Large bowel movement 1-25-large  15. Right 4 through 7, left 9 through 10 rib fractures w/pulmonary contusions: Continue pain control and pulmonary toilet.             - Encourage incentive spirometer and flutter valve. 16. L2-5 transverse process fractures - Pain control. 17. Right scapular spine fracture - Dr. Reyne consulted. This to be treated nonoperatively.  18. Right femur fracture - s/p IM rod 12/30 Dr. Reyne.   - 1/14: RLE knee immobilizer, ok for hip ROM; WBAT for transfers only in boot and KI  19. Open right tib/fib fracture - s/p IM rod right tibia 12/30 Dr. Reyne, ORIF R medial  malleolus, Right partial patellectomy Dr. Josefina.   - 1/14: WBAT for tranfers only in boot and KI  20. Left humeral fracture - ORIF 12/31 Dr. Kendal.   - 1/14: WBAT w/ radial nerve palsy splint  21. Left femur fracture - s/p IM rod 12/30 Dr. Josefina. 22. Left foot fractures - s/p closed reduction left toes 12/30. 23. Left tibial plateau fracture -  washed out 12/30 Dr. Reyne, ORIF 12/31 Hr. Haddix.     - 1/14: LLE unrestricted ROM, NWB  24. Obesity. Body mass index is 32.13 kg/m. Complicates all  aspects of care.   LOS: 19 days A FACE TO FACE EVALUATION WAS PERFORMED  Murray Collier 02/20/2024, 12:40 PM     "

## 2024-02-20 NOTE — Plan of Care (Signed)
" °  Problem: RH BOWEL ELIMINATION Goal: RH STG MANAGE BOWEL WITH ASSISTANCE Description: STG Manage Bowel with mod I Assistance. Outcome: Progressing   Problem: RH SKIN INTEGRITY Goal: RH STG SKIN FREE OF INFECTION/BREAKDOWN Description: Manage skin without infection with min assist Outcome: Progressing   Problem: RH SAFETY Goal: RH STG ADHERE TO SAFETY PRECAUTIONS W/ASSISTANCE/DEVICE Description: STG Adhere to Safety Precautions With cues Assistance/Device. Outcome: Progressing   Problem: RH PAIN MANAGEMENT Goal: RH STG PAIN MANAGED AT OR BELOW PT'S PAIN GOAL Description: Pain < 4 with prns Outcome: Progressing   "

## 2024-02-20 NOTE — Plan of Care (Signed)
" °  Problem: Consults Goal: RH GENERAL PATIENT EDUCATION Description: See Patient Education module for education specifics. Outcome: Progressing Goal: Skin Care Protocol Initiated - if Braden Score 18 or less Description: If consults are not indicated, leave blank or document N/A Outcome: Progressing Goal: Nutrition Consult-if indicated Outcome: Progressing   Problem: RH BOWEL ELIMINATION Goal: RH STG MANAGE BOWEL WITH ASSISTANCE Description: STG Manage Bowel with mod I Assistance. Outcome: Progressing Goal: RH STG MANAGE BOWEL W/MEDICATION W/ASSISTANCE Description: STG Manage Bowel with Medication with mod I Assistance. Outcome: Progressing   Problem: RH BLADDER ELIMINATION Goal: RH STG MANAGE BLADDER WITH ASSISTANCE Description: STG Manage Bladder With min  Assistance Outcome: Progressing Goal: RH STG MANAGE BLADDER WITH MEDICATION WITH ASSISTANCE Description: STG Manage Bladder With Medication With mod I Assistance. Outcome: Progressing Goal: RH STG MANAGE BLADDER WITH EQUIPMENT WITH ASSISTANCE Description: STG Manage Bladder With Equipment With min Assistance Outcome: Progressing   Problem: RH SKIN INTEGRITY Goal: RH STG SKIN FREE OF INFECTION/BREAKDOWN Description: Manage skin without infection with min assist Outcome: Progressing   Problem: RH SAFETY Goal: RH STG ADHERE TO SAFETY PRECAUTIONS W/ASSISTANCE/DEVICE Description: STG Adhere to Safety Precautions With cues Assistance/Device. Outcome: Progressing   Problem: RH PAIN MANAGEMENT Goal: RH STG PAIN MANAGED AT OR BELOW PT'S PAIN GOAL Description: Pain < 4 with prns Outcome: Progressing   Problem: RH KNOWLEDGE DEFICIT GENERAL Goal: RH STG INCREASE KNOWLEDGE OF SELF CARE AFTER HOSPITALIZATION Description: Patient and parents will be able to manage care using educational resources for medications, skin care/ foley care and dietary modification independently Outcome: Progressing   "

## 2024-02-21 ENCOUNTER — Inpatient Hospital Stay (HOSPITAL_COMMUNITY)

## 2024-02-21 DIAGNOSIS — T07XXXA Unspecified multiple injuries, initial encounter: Secondary | ICD-10-CM | POA: Diagnosis not present

## 2024-02-21 LAB — BASIC METABOLIC PANEL WITH GFR
Anion gap: 13 (ref 5–15)
BUN: 14 mg/dL (ref 6–20)
CO2: 25 mmol/L (ref 22–32)
Calcium: 9.7 mg/dL (ref 8.9–10.3)
Chloride: 103 mmol/L (ref 98–111)
Creatinine, Ser: 0.82 mg/dL (ref 0.61–1.24)
GFR, Estimated: >60 mL/min
Glucose, Bld: 81 mg/dL (ref 70–99)
Potassium: 4 mmol/L (ref 3.5–5.1)
Sodium: 141 mmol/L (ref 135–145)

## 2024-02-21 LAB — CBC
HCT: 36.1 % — ABNORMAL LOW (ref 39.0–52.0)
Hemoglobin: 11.8 g/dL — ABNORMAL LOW (ref 13.0–17.0)
MCH: 29.8 pg (ref 26.0–34.0)
MCHC: 32.7 g/dL (ref 30.0–36.0)
MCV: 91.2 fL (ref 80.0–100.0)
Platelets: 282 10*3/uL (ref 150–400)
RBC: 3.96 MIL/uL — ABNORMAL LOW (ref 4.22–5.81)
RDW: 14.1 % (ref 11.5–15.5)
WBC: 3.2 10*3/uL — ABNORMAL LOW (ref 4.0–10.5)
nRBC: 0 % (ref 0.0–0.2)

## 2024-02-21 MED ORDER — HYDROXYZINE HCL 25 MG PO TABS: 25.0000 mg | ORAL_TABLET | Freq: Three times a day (TID) | ORAL | Status: DC | PRN

## 2024-02-21 MED ORDER — HYDROXYZINE HCL 25 MG PO TABS: 25.0000 mg | ORAL_TABLET | Freq: Every evening | ORAL | Status: DC | PRN

## 2024-02-21 NOTE — Progress Notes (Signed)
 Patient is s/p L femur IMN by Dr. Josefina on 01/25/24 and has since been transferred to [facility] for CIR. He is NWB LLE.  He is having increased tenderness to mid left thigh. Notes indicate a palpable, mobile mass to the area. New xrays of the L femur were ordered today and I was contacted about reviewing the xrays taken today to compare to previous ones.  Today's xrays are better projections and show more of a true lateral than those taken 01/25/24 so that does slightly make it look like there is more elevation of the soft tissue superficial to the Herrera butterfly fracture fragment. Likely hematoma/seroma. No migration of bone fragments.   It would cause more trauma/damage to surrounding tissues to try and remove or manipulate the fracture fragments. Recommend ice, heat, compression, lidocaine  patches to help with local symptoms of pain and swelling.  Will reassess at office f/u visit with surgeon.     Aaron CHRISTELLA Large, PA-C Office [phone] 02/21/2024, 7:54 PM

## 2024-02-21 NOTE — Plan of Care (Signed)
" °  Problem: Consults Goal: RH GENERAL PATIENT EDUCATION Description: See Patient Education module for education specifics. Outcome: Progressing Goal: Skin Care Protocol Initiated - if Braden Score 18 or less Description: If consults are not indicated, leave blank or document N/A Outcome: Progressing Goal: Nutrition Consult-if indicated Outcome: Progressing   Problem: RH BOWEL ELIMINATION Goal: RH STG MANAGE BOWEL WITH ASSISTANCE Description: STG Manage Bowel with mod I Assistance. Outcome: Progressing Goal: RH STG MANAGE BOWEL W/MEDICATION W/ASSISTANCE Description: STG Manage Bowel with Medication with mod I Assistance. Outcome: Progressing   Problem: RH BLADDER ELIMINATION Goal: RH STG MANAGE BLADDER WITH ASSISTANCE Description: STG Manage Bladder With min  Assistance Outcome: Progressing Goal: RH STG MANAGE BLADDER WITH MEDICATION WITH ASSISTANCE Description: STG Manage Bladder With Medication With mod I Assistance. Outcome: Progressing Goal: RH STG MANAGE BLADDER WITH EQUIPMENT WITH ASSISTANCE Description: STG Manage Bladder With Equipment With min Assistance Outcome: Progressing   Problem: RH SKIN INTEGRITY Goal: RH STG SKIN FREE OF INFECTION/BREAKDOWN Description: Manage skin without infection with min assist Outcome: Progressing   Problem: RH SAFETY Goal: RH STG ADHERE TO SAFETY PRECAUTIONS W/ASSISTANCE/DEVICE Description: STG Adhere to Safety Precautions With cues Assistance/Device. Outcome: Progressing   Problem: RH PAIN MANAGEMENT Goal: RH STG PAIN MANAGED AT OR BELOW PT'S PAIN GOAL Description: Pain < 4 with prns Outcome: Progressing   Problem: RH KNOWLEDGE DEFICIT GENERAL Goal: RH STG INCREASE KNOWLEDGE OF SELF CARE AFTER HOSPITALIZATION Description: Patient and parents will be able to manage care using educational resources for medications, skin care/ foley care and dietary modification independently Outcome: Progressing   "

## 2024-02-21 NOTE — Progress Notes (Signed)
 Notified oncall provider Sharlet Schmitz, PA of x-ray results.   Geni Armor, LPN

## 2024-02-21 NOTE — Progress Notes (Signed)
 "                                                        PROGRESS NOTE   Subjective/Complaints:  No events overnight. No acute complaints.  Was having some pain in his right ankle this morning with PT, but resolved quickly.  Does feel he has ongoing anxiety, especially regarding discharge home, does not want medications adjusted at this time.  Vitals stable      02/21/2024    4:51 AM 02/20/2024    7:48 PM 02/20/2024    2:37 PM  Vitals with BMI  Systolic 115 123 871  Diastolic 61 70 90  Pulse 68 95 96   P.o. intakes appropriate  50% meals Continent of bladder   Last BM 1/25, large    ROS: Patient denies fever, chills,  rash, sore throat, blurred vision, dizziness, nausea, vomiting, diarrhea, cough, shortness of breath or chest pain,   headache, or mood change.  + Anxiety + Intermittent right ankle pain    Objective:   No results found.   Recent Labs    02/21/24 0441  WBC 3.2*  HGB 11.8*  HCT 36.1*  PLT 282      Recent Labs    02/21/24 0441  NA 141  K 4.0  CL 103  CO2 25  GLUCOSE 81  BUN 14  CREATININE 0.82  CALCIUM  9.7       Intake/Output Summary (Last 24 hours) at 02/21/2024 0900 Last data filed at 02/21/2024 9173 Gross per 24 hour  Intake 712 ml  Output 1775 ml  Net -1063 ml        Physical Exam: Vital Signs Blood pressure 115/61, pulse 68, temperature 98.1 F (36.7 C), temperature source Oral, resp. rate 18, height 5' 11 (1.803 m), weight 104.5 kg, SpO2 97%.  Constitutional: No distress . Vital signs reviewed.  Sitting up in therapy gym.   Cardiovascular: RRR without murmur. No JVD  .  Respiratory/Chest: CTA Bilaterally without wheezes or rales. Normal effort    GI/Abdomen: BS +, non-tender, non-distended Ext: no clubbing, cyanosis; trace right lower extremity edema Psych: pleasant, cooperative. Calm  Neurologic Exam:    Sensory exam: revealed normal sensation in all dermatomal regions in bilateral lower extremities, right upper  extremity, and with reduced sensation to light touch in L dorsal thumb-unchanged 1-26  Motor exam: Unchanged 1-26 strength 5/5 throughout right upper extremity  LUE 4/5 FF, 1/5 FA, 0/5 WE, 4/5 EF, 4/5 EE, 4/5 SA   Antigravity against resistance bilateral lower extremities, approximately 4/5, complicated by bracing   MSK:  no apparent deformity on exam.   Skin: Bilateral lower extremities covered in gauze and wrapped in Ace dressing; right knee incision well-approximated with Steri-Strips; ankle incision lines are open to air, well-approximated, and healing.  No drainage, erythema, warmth, or dehiscence at surgical sites -- Covered on exam 1-26  Small, palpable area of hardness on left mid femur; mobile, slightly tender, nonpulsatile.   Assessment/Plan: 1. Functional deficits which require 3+ hours per day of interdisciplinary therapy in a comprehensive inpatient rehab setting. Physiatrist is providing close team supervision and 24 hour management of active medical problems listed below. Physiatrist and rehab team continue to assess barriers to discharge/monitor patient progress toward functional and medical goals  Care Tool:  Bathing  Body parts bathed by patient: Left arm, Chest, Abdomen, Front perineal area, Right upper leg, Left upper leg, Right arm, Left lower leg, Face   Body parts bathed by helper: Buttocks Body parts n/a: Right lower leg   Bathing assist Assist Level: Minimal Assistance - Patient > 75%     Upper Body Dressing/Undressing Upper body dressing   What is the patient wearing?: Pull over shirt    Upper body assist Assist Level: Set up assist    Lower Body Dressing/Undressing Lower body dressing      What is the patient wearing?: Pants, Incontinence brief     Lower body assist Assist for lower body dressing: Moderate Assistance - Patient 50 - 74%     Toileting Toileting    Toileting assist Assist for toileting: 2 Helpers      Transfers Chair/bed transfer  Transfers assist  Chair/bed transfer activity did not occur: Safety/medical concerns (orthostatic hypotension)  Chair/bed transfer assist level: Total Assistance - Patient < 25%     Locomotion Ambulation   Ambulation assist   Ambulation activity did not occur: Safety/medical concerns          Walk 10 feet activity   Assist  Walk 10 feet activity did not occur: Safety/medical concerns        Walk 50 feet activity   Assist Walk 50 feet with 2 turns activity did not occur: Safety/medical concerns         Walk 150 feet activity   Assist Walk 150 feet activity did not occur: Safety/medical concerns         Walk 10 feet on uneven surface  activity   Assist Walk 10 feet on uneven surfaces activity did not occur: Safety/medical concerns         Wheelchair     Assist Is the patient using a wheelchair?: Yes Type of Wheelchair: Manual Wheelchair activity did not occur: Safety/medical concerns         Wheelchair 50 feet with 2 turns activity    Assist    Wheelchair 50 feet with 2 turns activity did not occur: Safety/medical concerns       Wheelchair 150 feet activity     Assist  Wheelchair 150 feet activity did not occur: Safety/medical concerns       Blood pressure 115/61, pulse 68, temperature 98.1 F (36.7 C), temperature source Oral, resp. rate 18, height 5' 11 (1.803 m), weight 104.5 kg, SpO2 97%.  Medical Problem List and Plan: 1. Functional deficits secondary to polytrauma             -patient may shower with surgical dressings covered             -ELOS/Goals: 12-16 days, Mod A PT/OT at [facility] level - 02/23/24, pending WC ramp being built -Continue CIR therapies including PT, OT,SLP    Weight bearing: WBAT B/L upper extremities. May weight bear on right leg for transfers in boot and knee immobilizer NWB Left leg and should wear post op shoe on left foot when transferring    Splinting: L  radial nerve palsy splint (finger bands) during daytime hours PRN for exercise and functional tasks; SLEEP in black wrist cock-up splint and wear PRN during the day when not wearing other splint to prevent wrist drop.    - 1/13: DC home with parents, mom primary caregiver and concerned that she can;t Do much; dad works during daytime. Max A bed mobility very limited by pain and anxiety - up to EOB  yesterday - trying slideboard transfer today.  Min A UB, Max A LB care. Downgrading goals to Min A. SLP mild cognitive deficits  - SPV to ind with memory as biuggest deficit.    -1-15: Weightbearing status per orthopedics as below.    To follow-up with Dr. Kendal 2 weeks after DC for x-rays of the left humerus and left knee, and Dr.Gebauer/Landau for bilateral tibia left femur and left toe fractures  -1/20: Mom working on remodeling their home, widening doorways for his WC. SPV bed mobility, Min A slideboard transfers due to need to place but once down he is supervision, SPV WC ambulation, STS Max A 2x yesterday. Goal SPT for car and bed transfers. Setup UB care, Mod-Max A LB care due to pain but is doing well with adaptive techniques with the reacher this week. [employer] putting in temporary ramp--discussed keeping in touch with us  on a date that this will be completed to help coordinate discharge plans.  - 1-23: Car transfer not feasible due to height; arranging medical transport for discharge.   2.  Antithrombotics: -DVT/anticoagulation:  Mechanical: Sequential compression devices, below knee Bilateral lower extremities Pharmaceutical: Lovenox              -antiplatelet therapy: n/a             -BL LE duplex dopplers negative   - 1-15 per Ortho, transition to Eliquis  2.5 mg twice daily for 30 days for DVT prophylaxis at discharge - will start transition to Eliquis  today for patient comfort on 1-17; last Lovenox  dose tonight 1/16--tolerating   3. Pain Management: Tylenol  1000 mg q6h, gabapentin   300 mg 3 times daily.  Oxycodone  10 mg q4h and  Robaxin  500 mg q6 PRN.   1/8: Reduce tylenol  to 1000 mg TID, schedule robaxin  500 mg QID, schedule oxycodone  5 mg QID and reduce PRN to 5 mg Q6H PRN  1/11 still chasing pain at times. Pain wakes him up overnight   -will change scheduled oxycodone  5 q6 to oxycontin , titrate that if needed.   - 1-12: Monitor on OxyContin   - 1-13: Increase Robaxin  to 1000 mg every 6 hours scheduled, add tizanidine  2 mg every 6 hours as needed, and encouraged use of as needed oxycodone  prior to therapies  - 1/14: Increase PRN oxycodone  to 5-10 mg for moderate to severe pain; increase at bedtime gabapentin  to 600 mg to assist in at bedtime control  1-15: Using mostly oxycodone  10 overnight, pain remains 6-7 and interrupting sleep.  Results with increase gabapentin .  We discussed the patient today adding Elavil  versus scheduling tizanidine --will add Elavil  25 mg nightly.  1-16: Slept great with Elavil , continue current regimen  1-18: Doing well with current pain regimen, would start weaning OxyContin  this coming week  1/19 has not been using frequent oxycodone , reports pain is controlled.  Will continue current regimen for for today, could consider discontinue OxyContin  in the next day or two  - 1-22: DC OxyContin  this a.m.; monitor oxycodone  use over the next few days to gradually wean  1-23: Is using oxycodone  about once per day; reduce Robaxin  to 1000 mg 3 times daily, gabapentin  to 300 mg 3 times daily.  DC tizanidine .  Monitor oxycodone  use, will likely give short supply at discharge and then wean as outpatient.  -1/24 only used oxycodone  once last night and once earlier this morning.  Continue current regimen and monitor  -1/25 pain overall controlled, using as needed oxycodone  about daily--wishes to continue current regimen  4. Mood/Behavior/Sleep: LCSW to follow for evaluation and support when available.              -anxiety: d/t poor sleep, worry, and  claustrophobia; denies nightmares or re-living traumatic event. Continue hydroxyzine  at bedtime.              -sleep: melatonin 5 mg at bedtime.   - 1-8: Not using as needed Atarax , continue nightly scheduled and DC 4 times daily as needed.  If anxiety ongoing limiting factor, will discuss initiation of BuSpar .  1/9-10: Started BuSpar  7.5 mg twice daily.  Encouraged use of as needed Atarax  at night if needed--monitor this weekend.  1/11- some improvement but will increase to 10mg  bid as anxiety still a factor --monitor 1 to 2 days on increased regimen 1-13: Patient endorses this is generally well-controlled, major triggers using bedpan but otherwise doing better 1/14: Sleep interruptions due to pain, melatonin not effective, change to PRN trazodone  50 mg at bedtime. Consider adding elavil  at bedtime if gabapentin  increase not effective.  1-15: Adding Elavil  25 mg nightly-did well with this, slightly groggy this morning but tolerable. --1/26: Discussed moving Atarax  to as needed, patient wishes to keep scheduled at nighttime, no additional adjustments.  Can titrate further as outpatient.   5. Neuropsych/cognition: Psych consult ordered 1/6. Neuropsych Consult ordered in CIR.  This patient is capable of making decisions on his own behalf.   6. Skin/Wound Care: Routine pressure relief measures.               - 1 week post-op; will reach out to orthopedic surgical teams regarding timeline for suture/staple removal, RLE wrapping   -- Per Dr. Reyne, okay to remove sutures/staples. Ordered 1-8 - 1/9: Per Lauraine Moores, left humerus and right tibial incisions can be left open to air.  Per Dr. Josefina, right ankle dressing to remain intact unless saturated.   1-13: Remove remaining staples in the left proximal thigh and right knee--reminded nursing on 1-14 1-14: Ortho eval today:  LUE - ok to leave incision open to air LLE - ok to leave knee incision open to air RLE - Staples to be removed today and  dressing change PRN 1-16: Nursing to remove residual staples/sutures on right ankle  1-22: Unwrapped and looked at bilateral lower extremities; all areas healing well    7. Fluids/Electrolytes/Nutrition: Monitor strict I&O and weights. Follow up labs CBC/CMP               -Regular diet + Ensure and vitamin supplements              - Good p.o. intakes overall   8.  Splenic laceration: 2 cm, monitor hemoglobin with CBC.   9.  Possible liver laceration: Small left zone 2/3 retroperitoneal hematoma.  Same as above   10.  ABLA: S/p 4 units PRBC, 1 unit whole blood, 2 unit FFP on 12/30.  Hemoglobin 8.9 from 8.8.  PLTs improved now to 63.               - 1-7: Stable on admission labs, follow-up labs   1-19 hemoglobin stable 10.4  11.  AKI: Related to hypovolemia on admission, resolved.  Encourage oral hydration. Monitor BMP               - 1/7: BUN/creatinine stable; monitor, stable at 1-7   - 1-12: A.m. labs stable, continue to encourage p.o. fluids, blood pressure holding up better   - BUN and creatinine stable  12.  Tachycardia/hypertension: Continue Lopressor  25 mg twice daily.               - 1/7: Hypotensive with therapies + tachycardic - 500 cc IVF bolus today--responsive   - 1/8: Remains tachycardic 120s; titrate pain regimen as above; repeat orthostats and add 75 cc/hr drip if +; encourage PO fluids - may need to increase lopressor   1/11 HR a little better this morning (105) -continue lopressor  at 50mg  bid to see if this better controls without dropping bp too much -rx pain and anxiety as above 1/12-19: Improving with pain control, monitor. 1-20: Reducing pain medications tomorrow morning, monitor for recurrent tachycardia, then wean Lopressor  if tolerated. 1-22: Reduce Lopressor  to 25 mg twice daily 1-23: Lopressor  to 12.3 mg twice daily, if heart rate and blood pressure stays stable can DC Lopressor  1/24 -1/24 stable, DC Lopressor  -1/25-26 heart rate stable continue current  regimen and monitor     02/21/2024    4:51 AM 02/20/2024    7:48 PM 02/20/2024    2:37 PM  Vitals with BMI  Systolic 115 123 871  Diastolic 61 70 90  Pulse 68 95 96      13.  Urinary retention: TOV failed 1/1 & 1/5. Foley replaced on 1/6 - Continue Flomax .  1/7 Flomax  moved to at bedtime due to orthostasis.  1/8: + BM yesterday; will plan foley trial Monday 1/12 --order 1/13-14: Continues with high volume output but has been continent of all voids, continue current regimen -1-15 urinary retention resolved, continent of all voids.  Will continue Flomax  for now given ongoing high-volume output and poor mobility. 1-17: High bladder scan yesterday, increase Flomax  to 0.8 mg after supper. 1-18: PVRs low.  Patient endorsing difficulty voiding only when he is laying down.  Encouraged upright seated position or bedside commode for voids.  DC PVRs. - Voiding continent on bedside commode on current regimen; would not reduce Flomax  until mobility improves  14.  Constipation: received mag citrate 1/6 LBM unsure of success. KUB ordered.              -Miralax  increased to BID. Colace BID, as needed enema.              1/7: Straining with orthostasis - give bowel prep  1/8: medium BM with suppository and prep - schedule miralax  17 g BID, sennakot S 1 tab BID; encourage PO fluids as above  1-9: Increase Senokot to 2 tabs twice daily   - Multiple large BMs 1-11.   1-13: Patient endorses trouble using bedpan as huge source of anxiety, encouraged to practice transfers to assist in changing this. 1/14: Monitor through today; may need sorbitol if no BM tonight Last bowel movement 1-14, large.  Still requiring enema. 1-15: Increase Senokot-S to 3 tabs twice daily, continue MiraLAX  17 g twice daily, add as needed lactulose  10 g for moderate constipation.  1-17 medium bowel movement  1-18: 2 bowel movements yesterday, starting to pick up.  Reduce Senokot-S to 1 tab twice daily  Large bowel movement  1-25-large  15. Right 4 through 7, left 9 through 10 rib fractures w/pulmonary contusions: Continue pain control and pulmonary toilet.             - Encourage incentive spirometer and flutter valve. 16. L2-5 transverse process fractures - Pain control. 17. Right scapular spine fracture - Dr. Reyne consulted. This to be treated nonoperatively.  18. Right femur fracture - s/p IM rod 12/30 Dr. Reyne.   -  1/14: RLE knee immobilizer, ok for hip ROM; WBAT for transfers only in boot and KI  19. Open right tib/fib fracture - s/p IM rod right tibia 12/30 Dr. Reyne, ORIF R medial malleolus, Right partial patellectomy Dr. Josefina.   - 1/14: WBAT for tranfers only in boot and KI  20. Left humeral fracture - ORIF 12/31 Dr. Kendal.   - 1/14: WBAT w/ radial nerve palsy splint  21. Left femur fracture - s/p IM rod 12/30 Dr. Josefina.  - 1/ 26: Patient concerned about persistent swelling over fracture site; will get x-ray today, but provided reassurance this is likely hematoma  22. Left foot fractures - s/p closed reduction left toes 12/30. 23. Left tibial plateau fracture -  washed out 12/30 Dr. Reyne, ORIF 12/31 Hr. Haddix.     - 1/14: LLE unrestricted ROM, NWB  24. Obesity. Body mass index is 32.13 kg/m. Complicates all aspects of care.   LOS: 20 days A FACE TO FACE EVALUATION WAS PERFORMED  Aaron Herrera Likes 02/21/2024, 9:00 AM     "

## 2024-02-21 NOTE — Progress Notes (Signed)
 Contacted Megan/ortho PA on call to evaluate films done today with ones prior.--he continues to be NWB on LLE. Displaced fragment noted in prior films but question stable v/s increase. Films done to evaluate lump and no new issues reported by nurse.

## 2024-02-21 NOTE — Progress Notes (Signed)
 Occupational Therapy Session Note  Patient Details  Name: Aaron Herrera MRN: [id number] Date of Birth: 09/03/2000  {CHL IP REHAB OT TIME CALCULATIONS:[id number]}   Short Term Goals: Week 3:  OT Short Term Goal 1 (Week 3): Pt will complete posterior pericare with Min A + PRN AE. OT Short Term Goal 2 (Week 3): Pt will perform sit<>stand with Mod A in preparation for standing ADLs. OT Short Term Goal 3 (Week 3): Pt will maintain static standce with Min A in preparation for standing ADLs.  Skilled Therapeutic Interventions/Progress Updates:  Pt greeted *** for skilled OT session with focus on ***.   Pain: Pt reported ***/10 pain, stating *** in reference to ***. OT offering intermediate rest breaks and positioning suggestions throughout session to address pain/fatigue and maximize participation/safety in session.   Functional Transfers:  Self Care Tasks: Pt completes the following self care tasks with levels of assistance noted below, UB: LB:   Therapeutic Activities:  Therapeutic Exercise:   Education:  Pt remained *** with 4Ps assessed and immediate needs met. Pt continues to be appropriate for skilled OT intervention to promote further functional independence in ADLs/IADLs.    Therapy Documentation Precautions:  Precautions Precautions: Fall Precaution/Restrictions Comments: watch BP Required Braces or Orthoses: Knee Immobilizer - Right, Other Brace Knee Immobilizer - Right: On at all times Other Brace: R CAM boot, L post op shoe, L RNP splint, L wrist cock up splint Restrictions Weight Bearing Restrictions Per Provider Order: No RUE Weight Bearing Per Provider Order: Weight bearing as tolerated LUE Weight Bearing Per Provider Order: Weight bearing as tolerated RLE Weight Bearing Per Provider Order: Weight bearing as tolerated LLE Weight Bearing Per Provider Order: Non weight bearing Other Position/Activity Restrictions: no ROM of R knee, NO ROM restrictions L knee,  no shoulder ROM restrictions   Therapy/Group: Individual Therapy  Nereida Habermann, OTR/L, MSOT  02/21/2024, 9:03 PM

## 2024-02-21 NOTE — Progress Notes (Signed)
 Physical Therapy Session Note  Patient Details  Name: Aaron Herrera MRN: [id number] Date of Birth: 2000-03-10  Today's Date: 02/21/2024 PT Individual Time: 0920-1031 PT Individual Time Calculation (min): 71 min   Short Term Goals: Week 3:  PT Short Term Goal 1 (Week 3): STG = LTG due to ELOS  Skilled Therapeutic Interventions/Progress Updates:    Pt presents in room in bed, agreeable to PT. Pt denies pain. Session focused on therapeutic activities to promote improved transfer mechanics and strengthening to complete stand pivot transfers. Pt completes bed mobility using strap on RLE, requires set up assist for strap otherwise able to do without assist. Pt completes slideboard transfer with supervision and set up of WC. Pt able to manage WC parts with supervision and increased time. Pt self propels WC with BUEs with supervision and sets up for transfer, reuqires min assist for R leg rest. Therapist adjusts knee immobilizer and CAM boot total assist for sit to stand practice. Mat elevated to max height, completes sit to stand x4 with mod assist x1, last two trials with pt able to take very small step backwards towards mat able to clear foot with BUE weightbearing on RW. Pt requires extended seated rest break between trials, most significantly after 4th trial as pt reporting nausea. RN notified and provides medication, therapist provides gingerale. Pt completes 5th stand from 26 mat height, mod assist however unable to complete step backwards due to fatigue. Pt completes slideboard transfer back to [facility] with supervision and manages WC parts with supervision. Pt returns to room dependently for time management and remains seated in [facility] with all needs within reach, cal light in place at end of session.   Therapy Documentation Precautions:  Precautions Precautions: Fall Precaution/Restrictions Comments: watch BP Required Braces or Orthoses: Knee Immobilizer - Right, Other Brace Knee Immobilizer - Right:  On at all times Other Brace: R CAM boot, L post op shoe, L RNP splint, L wrist cock up splint Restrictions Weight Bearing Restrictions Per Provider Order: No RUE Weight Bearing Per Provider Order: Weight bearing as tolerated LUE Weight Bearing Per Provider Order: Weight bearing as tolerated RLE Weight Bearing Per Provider Order: Weight bearing as tolerated LLE Weight Bearing Per Provider Order: Non weight bearing Other Position/Activity Restrictions: no ROM of R knee, NO ROM restrictions L knee, no shoulder ROM restrictions     Therapy/Group: Individual Therapy  Reche Ohara PT, DPT 02/21/2024, 11:03 AM

## 2024-02-21 NOTE — Progress Notes (Signed)
 Physical Therapy Note  Patient Details  Name: Aaron Herrera MRN: [id number] Date of Birth: 10/16/2000 Today's Date: 02/21/2024    Due to the [place]  state of emergency, patients may not receive 3.0 hours of therapy services.   Milad Bublitz 02/21/2024, 10:59 AM

## 2024-02-22 DIAGNOSIS — T07XXXA Unspecified multiple injuries, initial encounter: Secondary | ICD-10-CM | POA: Diagnosis not present

## 2024-02-22 NOTE — Progress Notes (Signed)
 Speech Language Pathology Daily Session Note  Patient Details  Name: Aaron Herrera MRN: [id number] Date of Birth: 01/10/2001  Today's Date: 02/22/2024 SLP Individual Time: 0806-0900 SLP Individual Time Calculation (min): 54 min  Short Term Goals: Week 2: SLP Short Term Goal 1 (Week 2): STGs= LTGs due to ELOS  Skilled Therapeutic Interventions:  Pt greeted at bedside for tx targeting cognition. He was up in his WC upon SLP arrival, and requested a few extra minutes to use the urinal. SLP provided the pt w/ privacy upon request and returned ~5 mins after. SLP introduced structured memory task w/ 12 words. After an ~2 delay, he independently recalled 11/12 items. He then utilized association WRAP memory strategy to ID and recall items by category. This improved success to 12/12 words after 25 min delay. Between recall times he completed complex cognitive tasks x3 targeting organization, deductive reasoning, working memory and information processing. He completed all tasks independently w/ adequate processing time. At the end of tx tasks, he verbalized final education re cognition, progress thus far, and discharge recommendations. Recommend d/c from ST at this time given ability to complete all cognitive tasks independently. See d/c summary for more info.   Pain Pain Assessment Pain Scale: 0-10 Pain Score: 7  Pain Location: Leg  Therapy/Group: Individual Therapy  Aaron Herrera 02/22/2024, 8:15 AM

## 2024-02-22 NOTE — Progress Notes (Signed)
 Speech Language Pathology Discharge Summary  Patient Details  Name: Aaron Herrera MRN: [id number] Date of Birth: [birth year]  Date of Discharge from SLP service:February 22, 2024  Patient has met 4 of 4 long term goals.  Patient to discharge at overall Modified Independent level.  Reasons goals not met: n/a   Clinical Impression/Discharge Summary:  Pt has made excellent progress this stay, as evidenced by mastery of 4/4 LTGs. Very mild executive functioning deficits noted at time of eval have now resolved and pt demonstrates adequate cognitive skills to return to PLOF w/o difficulty. Pt/family education complete and he will not require any additional services upon d/c. Recommend d/c for ST at this time.    Care Partner:  Caregiver Able to Provide Assistance: Yes  Type of Caregiver Assistance: Cognitive;Physical  Recommendation:  None  Rationale for SLP Follow Up: Other (comment) (n/a skilled ST not warranted)   Equipment: n/a   Reasons for discharge: Treatment goals met   Patient/Family Agrees with Progress Made and Goals Achieved: Yes    [name] 02/22/2024, 11:11 AM

## 2024-02-22 NOTE — Progress Notes (Signed)
 Patient ID: Aaron Herrera, male   DOB: 2000/08/27, 24 y.o.   MRN: [id number]  1234- SW spoke with pt mother to provide updates from team conference and d/c date change from 1/28 to 1/31 due to gains made in rehab. SW asked about pt d/c to his grandfather's home and ramp status.Confirms d/c to grandfather's home and his home still needs ramp. States the ramp should be built either tomorrow of Thursday. SW dicussed DME- w/c and bariatric DABSC (private pay). SW will confirm hospital bed, and discussed private pay for bariatric DABSC so they can purchase item. SW will follow-up once more updates.   SW confirms with medical team will arrange for medical transport to home on Saturday and hospital bed.  *SW went by room to update pt but pt not present. SW will follow-up.    1517- SW called pt mother back to confirm hospital bed. SW confirms D/c address: [address] [place] [address].  SW ordered items with [employer] via parachute- hospital bed and w/c.   Graeme Jude, MSW, LCSW Office: [phone] Cell: [phone] Fax: [phone]

## 2024-02-22 NOTE — Progress Notes (Signed)
 Occupational Therapy Session Note  Patient Details  Name: QUINT CHESTNUT MRN: [id number] Date of Birth: 2000/05/18  {CHL IP REHAB OT TIME CALCULATIONS:[id number]}   Short Term Goals: Week 3:  OT Short Term Goal 1 (Week 3): Pt will complete posterior pericare with Min A + PRN AE. OT Short Term Goal 2 (Week 3): Pt will perform sit<>stand with Mod A in preparation for standing ADLs. OT Short Term Goal 3 (Week 3): Pt will maintain static standce with Min A in preparation for standing ADLs.  Skilled Therapeutic Interventions/Progress Updates:   Pt greeted *** for skilled OT session with focus on ***.   Pain: Pt reported ***/10 pain, stating *** in reference to ***. OT offering intermediate rest breaks and positioning suggestions throughout session to address pain/fatigue and maximize participation/safety in session.   Functional Transfers:  Self Care Tasks: Pt completes the following self care tasks with levels of assistance noted below, UB: LB:   Therapeutic Activities:  Therapeutic Exercise:   Education:  Pt remained *** with 4Ps assessed and immediate needs met. Pt continues to be appropriate for skilled OT intervention to promote further functional independence in ADLs/IADLs.    Therapy Documentation Precautions:  Precautions Precautions: Fall Precaution/Restrictions Comments: watch BP Required Braces or Orthoses: Knee Immobilizer - Right, Other Brace Knee Immobilizer - Right: On at all times Other Brace: R CAM boot, L post op shoe, L RNP splint, L wrist cock up splint Restrictions Weight Bearing Restrictions Per Provider Order: No RUE Weight Bearing Per Provider Order: Weight bearing as tolerated LUE Weight Bearing Per Provider Order: Weight bearing as tolerated RLE Weight Bearing Per Provider Order: Weight bearing as tolerated LLE Weight Bearing Per Provider Order: Non weight bearing Other Position/Activity Restrictions: no ROM of R knee, NO ROM restrictions L knee,  no shoulder ROM restrictions   Therapy/Group: Individual Therapy  Nereida Habermann, OTR/L, MSOT  02/22/2024, 5:47 PM

## 2024-02-22 NOTE — Progress Notes (Signed)
 Physical Therapy Session Note  Patient Details  Name: Aaron Herrera MRN: [id number] Date of Birth: 2000-09-15  Today's Date: 02/22/2024 PT Individual Time: 0920-1030 + [id number] PT Individual Time Calculation (min): 70 min + 27 min  Short Term Goals: Week 3:  PT Short Term Goal 1 (Week 3): STG = LTG due to ELOS  Skilled Therapeutic Interventions/Progress Updates:    SESSION 1: Pt presents in room in [facility] and motivated to participate with PT. Pt reports increased pain in BLEs today, reports premedicated. Session focused on therapeutic activities to faciliate improved transfer mechanics and activity tolerance. Pt self propels WC from room to day room modI and sets up for transfer with supervision however no cues required, able to do all aspects of transfer (WC parts management, slideboard placement, slideboard transfer) with distant supervision no cues, bordering on modI. Pt then completes sit to stands from 25 elevated mat height however requires max assist to stand, pt uses BUEs on RW to push to standing, requires assist for sitting. Mat elevated to max height and pt completes 5 more trials from this height with mod assist to stand, cues for LLE NWB, with each trial pt completes backwards hop x2 or lateral hop x2 bilaterally in preparation for stand pivot transfer. Extensive education completed on how to manage sit to stands from [facility] to RW while maintain RLE extended and LLE NWB with pt verbalizing understanding. Pt completes therex to promote BUE/LLE strengthening needed for transfers including: - press ups BUEs on yoga blocks x10 with 3 sec hold - WC press ups x5 with emphasis on gluteal clearance pt able to hold with bilateral elbows extended for 2 seconds, therapist assisting with maintaining LLE NWB - LLE marches x10 - LLE LAQs with SLR x10 - LLE heel slides x10 Pt returns to room and remains seated in [facility] with all needs within reach, cal light in place at end of session.   SESSION 2: Pt  presents in room in [facility], agreeable to PT. Pt does not report pain this session. Pt parents present for session and educated throughout on pt progress including DME needs. Pt will benefit from a hospital bed to allow for level surface transfers with slideboard as well as providing elevating head and foot of bed to improve positioning for sleeping. Pt will also require a slideboard for safe transfers as well as a standard 18x20 MWC with elevating leg rests to improve independence with functional mobility. Pt self propels WC modI for BUE strengthening and activity tolerance, propels >300' from room to ortho gym and sets up on UBE for BUE muscular endurance training with pt propelling retro for 5 minutes on L4. Pt self propels back to room and sets up with all needs within reach , call light in place, and parents at bedside at end of session.     Therapy Documentation Precautions:  Precautions Precautions: Fall Precaution/Restrictions Comments: watch BP Required Braces or Orthoses: Knee Immobilizer - Right, Other Brace Knee Immobilizer - Right: On at all times Other Brace: R CAM boot, L post op shoe, L RNP splint, L wrist cock up splint Restrictions Weight Bearing Restrictions Per Provider Order: No RUE Weight Bearing Per Provider Order: Weight bearing as tolerated LUE Weight Bearing Per Provider Order: Weight bearing as tolerated RLE Weight Bearing Per Provider Order: Weight bearing as tolerated LLE Weight Bearing Per Provider Order: Non weight bearing Other Position/Activity Restrictions: no ROM of R knee, NO ROM restrictions L knee, no shoulder ROM restrictions  Therapy/Group: Individual Therapy  Reche Ohara PT, DPT 02/22/2024, 10:54 AM

## 2024-02-22 NOTE — Plan of Care (Signed)
  Problem: RH Problem Solving Goal: LTG Patient will demonstrate problem solving for (SLP) Description: LTG:  Patient will demonstrate problem solving for basic/complex daily situations with cues  (SLP) Outcome: Completed/Met   Problem: RH Memory Goal: LTG Patient will demonstrate ability for day to day (SLP) Description: LTG:   Patient will demonstrate ability for day to day recall/carryover during cognitive/linguistic activities with assist  (SLP) Outcome: Completed/Met   Problem: RH Attention Goal: LTG Patient will demonstrate this level of attention during functional activites (SLP) Description: LTG:  Patient will will demonstrate this level of attention during functional activites (SLP) Outcome: Completed/Met   Problem: RH Pre-functional/Other (Specify) Goal: RH LTG SLP (Specify) 1 Description: RH LTG SLP (Specify) 1 Outcome: Completed/Met

## 2024-02-22 NOTE — Progress Notes (Addendum)
 "                                                        PROGRESS NOTE   Subjective/Complaints:  No events overnight. No acute complaints.   Prns for nausea used yesterday--patient states this is only happened twice and is not consistent.  He is still sleeping well, feels anxiety is well-controlled, is having knee pain with transfers but is tolerable with his current medication regimen.  Discussed any apprehensions with being left alone for a few hours at a time, as patient states he feels it will be similar to how he has been doing things at the hospital and he can set up appropriately.  Still having some issues urinating if in a reclined position, but resolves once he sits up.  Discussed results of yesterday's x-ray and orthopedics recommendations, continue conservative management.  ROS: Patient denies fever, chills,  rash, sore throat, blurred vision, dizziness, nausea, vomiting, diarrhea, cough, shortness of breath or chest pain,   headache, or mood change.  + Anxiety + Bilateral knee pain    Objective:   DG FEMUR MIN 2 VIEWS LEFT Result Date: 02/21/2024 CLINICAL DATA:  Left thigh swelling with painful, palpable area of mid shaft laterally EXAM: LEFT FEMUR 2 VIEWS COMPARISON:  Left knee radiograph dated 02/09/2024 FINDINGS: Postsurgical changes of femoral fixation with intramedullary rod. Partially imaged postsurgical changes maximal tibia. Femoral diaphyseal alignment is near anatomic. Comminuted fracture of the mid to distal femoral diaphysis with large displaced fracture fragment along the anterolateral mid thigh, likely corresponding to reported palpable area of clinical concern. IMPRESSION: Comminuted fracture of the mid to distal femoral diaphysis with large displaced fracture fragment along the anterolateral mid thigh, likely corresponding to reported palpable area of clinical concern. Electronically Signed   By: Limin  Xu M.D.   On: 02/21/2024 14:40     Recent Labs     02/21/24 0441  WBC 3.2*  HGB 11.8*  HCT 36.1*  PLT 282      Recent Labs    02/21/24 0441  NA 141  K 4.0  CL 103  CO2 25  GLUCOSE 81  BUN 14  CREATININE 0.82  CALCIUM  9.7       Intake/Output Summary (Last 24 hours) at 02/22/2024 9076 Last data filed at 02/22/2024 0503 Gross per 24 hour  Intake 480 ml  Output 2175 ml  Net -1695 ml        Physical Exam: Vital Signs Blood pressure (!) 124/59, pulse 75, temperature 97.8 F (36.6 C), temperature source Oral, resp. rate 18, height 5' 11 (1.803 m), weight 104.5 kg, SpO2 99%.  Constitutional: No distress . Vital signs reviewed.  Sitting up in bedside chair.   Cardiovascular: RRR without murmur. No JVD  .  Respiratory/Chest: CTA Bilaterally without wheezes or rales. Normal effort    GI/Abdomen: BS +, non-tender, non-distended Ext: no clubbing, cyanosis; trace right lower extremity edema, left foot edema Psych: pleasant, cooperative. Calm  Neurologic Exam:    Sensory exam: revealed normal sensation in all dermatomal regions in bilateral lower extremities, right upper extremity, and with reduced sensation to light touch in L dorsal thumb-unchanged 1-28  Motor exam: Unchanged 1-28 strength 5/5 throughout right upper extremity  LUE 4/5 FF, 1/5 FA, 0/5 WE, 4/5 EF, 4/5 EE, 4/5 SA  Antigravity against resistance bilateral lower extremities, approximately 4/5 throughout but complicated by bracing/weightbearing restrictions   MSK:  no apparent deformity on exam.   Skin: Incisions and abrasions on bilateral lower extremities healing well--all well-approximated and scabbed over.; right lower extremity gauze and Ace wrapping with some retained moisture and odor under his current bracing, so these were removed.  Incisions left open to air.  Small, palpable area of hardness on left mid femur; mobile, slightly tender, nonpulsatile--unchanged in appearance 1-27   Assessment/Plan: 1. Functional deficits which require 3+ hours  per day of interdisciplinary therapy in a comprehensive inpatient rehab setting. Physiatrist is providing close team supervision and 24 hour management of active medical problems listed below. Physiatrist and rehab team continue to assess barriers to discharge/monitor patient progress toward functional and medical goals  Care Tool:  Bathing    Body parts bathed by patient: Left arm, Chest, Abdomen, Front perineal area, Right upper leg, Left upper leg, Right arm, Left lower leg, Face   Body parts bathed by helper: Buttocks Body parts n/a: Right lower leg   Bathing assist Assist Level: Minimal Assistance - Patient > 75%     Upper Body Dressing/Undressing Upper body dressing   What is the patient wearing?: Pull over shirt    Upper body assist Assist Level: Set up assist    Lower Body Dressing/Undressing Lower body dressing      What is the patient wearing?: Pants, Incontinence brief     Lower body assist Assist for lower body dressing: Moderate Assistance - Patient 50 - 74%     Toileting Toileting    Toileting assist Assist for toileting: 2 Helpers     Transfers Chair/bed transfer  Transfers assist  Chair/bed transfer activity did not occur: Safety/medical concerns (orthostatic hypotension)  Chair/bed transfer assist level: Total Assistance - Patient < 25%     Locomotion Ambulation   Ambulation assist   Ambulation activity did not occur: Safety/medical concerns          Walk 10 feet activity   Assist  Walk 10 feet activity did not occur: Safety/medical concerns        Walk 50 feet activity   Assist Walk 50 feet with 2 turns activity did not occur: Safety/medical concerns         Walk 150 feet activity   Assist Walk 150 feet activity did not occur: Safety/medical concerns         Walk 10 feet on uneven surface  activity   Assist Walk 10 feet on uneven surfaces activity did not occur: Safety/medical concerns          Wheelchair     Assist Is the patient using a wheelchair?: Yes Type of Wheelchair: Manual Wheelchair activity did not occur: Safety/medical concerns         Wheelchair 50 feet with 2 turns activity    Assist    Wheelchair 50 feet with 2 turns activity did not occur: Safety/medical concerns       Wheelchair 150 feet activity     Assist  Wheelchair 150 feet activity did not occur: Safety/medical concerns       Blood pressure (!) 124/59, pulse 75, temperature 97.8 F (36.6 C), temperature source Oral, resp. rate 18, height 5' 11 (1.803 m), weight 104.5 kg, SpO2 99%.  Medical Problem List and Plan: 1. Functional deficits secondary to polytrauma             -patient may shower with surgical dressings covered             -  ELOS/Goals: 12-16 days, Mod A PT/OT at [facility] level - 02/23/24, pending WC ramp being built--> 1/31 DC pending medicaid transport availability   -Continue CIR therapies including PT, OT,SLP    Weight bearing: WBAT B/L upper extremities. May weight bear on right leg for transfers in boot and knee immobilizer NWB Left leg and should wear post op shoe on left foot when transferring    Splinting: L radial nerve palsy splint (finger bands) during daytime hours PRN for exercise and functional tasks; SLEEP in black wrist cock-up splint and wear PRN during the day when not wearing other splint to prevent wrist drop.    - 1/13: DC home with parents, mom primary caregiver and concerned that she can;t Do much; dad works during daytime. Max A bed mobility very limited by pain and anxiety - up to EOB yesterday - trying slideboard transfer today.  Min A UB, Max A LB care. Downgrading goals to Min A. SLP mild cognitive deficits  - SPV to ind with memory as biuggest deficit.    -1-15: Weightbearing status per orthopedics as below.    To follow-up with Dr. Kendal 2 weeks after DC for x-rays of the left humerus and left knee, and Dr.Gebauer/Landau for bilateral tibia  left femur and left toe fractures  -1/20: Mom working on remodeling their home, widening doorways for his WC. SPV bed mobility, Min A slideboard transfers due to need to place but once down he is supervision, SPV WC ambulation, STS Max A 2x yesterday. Goal SPT for car and bed transfers. Setup UB care, Mod-Max A LB care due to pain but is doing well with adaptive techniques with the reacher this week. [employer] putting in temporary ramp--discussed keeping in touch with us  on a date that this will be completed to help coordinate discharge plans.  - 1-23: Car transfer not feasible due to height; arranging medical transport for discharge. - 1/27: Honing in on STS - able to hop backwards and side to side with walker; working on car transfer. Using all adaptive equipment for ADLs, Graduated from SLP.    Patient would benefit from a hospital bed at discharge due to ongoing inability to transfer off of low surfaces and significant mobility difficulty in bed secondary to bilateral lower extremity fractures with left lower extremity remaining nonweightbearing.  In addition, he has multiple traumatic wounds in various stages of healing, and would benefit from a bed that can assist in pressure offloading and repositioning with adjusting recline/head height.   2.  Antithrombotics: -DVT/anticoagulation:  Mechanical: Sequential compression devices, below knee Bilateral lower extremities Pharmaceutical: Lovenox              -antiplatelet therapy: n/a             -BL LE duplex dopplers negative   - 1-15 per Ortho, transition to Eliquis  2.5 mg twice daily for 30 days for DVT prophylaxis at discharge - will start transition to Eliquis  today for patient comfort on 1-17; last Lovenox  dose tonight 1/16--tolerating   3. Pain Management: Tylenol  1000 mg q6h, gabapentin  300 mg 3 times daily.  Oxycodone  10 mg q4h and  Robaxin  500 mg q6 PRN.   1/8: Reduce tylenol  to 1000 mg TID, schedule robaxin  500 mg QID, schedule  oxycodone  5 mg QID and reduce PRN to 5 mg Q6H PRN  1/11 still chasing pain at times. Pain wakes him up overnight   -will change scheduled oxycodone  5 q6 to oxycontin , titrate that if needed.   -  1-12: Monitor on OxyContin   - 1-13: Increase Robaxin  to 1000 mg every 6 hours scheduled, add tizanidine  2 mg every 6 hours as needed, and encouraged use of as needed oxycodone  prior to therapies  - 1/14: Increase PRN oxycodone  to 5-10 mg for moderate to severe pain; increase at bedtime gabapentin  to 600 mg to assist in at bedtime control  1-15: Using mostly oxycodone  10 overnight, pain remains 6-7 and interrupting sleep.  Results with increase gabapentin .  We discussed the patient today adding Elavil  versus scheduling tizanidine --will add Elavil  25 mg nightly.  1-16: Slept great with Elavil , continue current regimen  1-18: Doing well with current pain regimen, would start weaning OxyContin  this coming week  1/19 has not been using frequent oxycodone , reports pain is controlled.  Will continue current regimen for for today, could consider discontinue OxyContin  in the next day or two  - 1-22: DC OxyContin  this a.m.; monitor oxycodone  use over the next few days to gradually wean  1-23: Is using oxycodone  about once per day; reduce Robaxin  to 1000 mg 3 times daily, gabapentin  to 300 mg 3 times daily.  DC tizanidine .  Monitor oxycodone  use, will likely give short supply at discharge and then wean as outpatient.  -1/24 only used oxycodone  once last night and once earlier this morning.  Continue current regimen and monitor  -1/25 pain overall controlled, using as needed oxycodone  about daily--wishes to continue current regimen  4. Mood/Behavior/Sleep: LCSW to follow for evaluation and support when available.              -anxiety: d/t poor sleep, worry, and claustrophobia; denies nightmares or re-living traumatic event. Continue hydroxyzine  at bedtime.              -sleep: melatonin 5 mg at bedtime.   - 1-8: Not  using as needed Atarax , continue nightly scheduled and DC 4 times daily as needed.  If anxiety ongoing limiting factor, will discuss initiation of BuSpar .  1/9-10: Started BuSpar  7.5 mg twice daily.  Encouraged use of as needed Atarax  at night if needed--monitor this weekend.  1/11- some improvement but will increase to 10mg  bid as anxiety still a factor --monitor 1 to 2 days on increased regimen 1-13: Patient endorses this is generally well-controlled, major triggers using bedpan but otherwise doing better 1/14: Sleep interruptions due to pain, melatonin not effective, change to PRN trazodone  50 mg at bedtime. Consider adding elavil  at bedtime if gabapentin  increase not effective.  1-15: Adding Elavil  25 mg nightly-did well with this, slightly groggy this morning but tolerable. --1/26: Discussed moving Atarax  to as needed, patient wishes to keep scheduled at nighttime, no additional adjustments.  Can titrate further as outpatient.   5. Neuropsych/cognition: Psych consult ordered 1/6. Neuropsych Consult ordered in CIR.  This patient is capable of making decisions on his own behalf.  - Graduated from SLP   6. Skin/Wound Care: Routine pressure relief measures.               - 1 week post-op; will reach out to orthopedic surgical teams regarding timeline for suture/staple removal, RLE wrapping   -- Per Dr. Reyne, okay to remove sutures/staples. Ordered 1-8 - 1/9: Per Lauraine Moores, left humerus and right tibial incisions can be left open to air.  Per Dr. Josefina, right ankle dressing to remain intact unless saturated.   1-13: Remove remaining staples in the left proximal thigh and right knee--reminded nursing on 1-14 1-14: Ortho eval today:  LUE - ok to  leave incision open to air LLE - ok to leave knee incision open to air RLE - Staples to be removed today and dressing change PRN 1-16: Nursing to remove residual staples/sutures on right ankle  1-22: Unwrapped and looked at bilateral lower  extremities; all areas healing well 1-27: Right lower extremity unwrapped, concern for sweating/moisture retention and odor under current bandaging so we will leave open to air for now.  Informed nursing, can reinforce specific areas with Mepilex if there is any rubbing against the brace.    7. Fluids/Electrolytes/Nutrition: Monitor strict I&O and weights. Follow up labs CBC/CMP               -Regular diet + Ensure and vitamin supplements              - Good p.o. intakes overall   8.  Splenic laceration: 2 cm, monitor hemoglobin with CBC.   9.  Possible liver laceration: Small left zone 2/3 retroperitoneal hematoma.  Same as above   10.  ABLA: S/p 4 units PRBC, 1 unit whole blood, 2 unit FFP on 12/30.  Hemoglobin 8.9 from 8.8.  PLTs improved now to 63.               - 1-7: Stable on admission labs, follow-up labs   1-19 hemoglobin stable 10.4  11.  AKI: Related to hypovolemia on admission, resolved.  Encourage oral hydration. Monitor BMP               - 1/7: BUN/creatinine stable; monitor, stable at 1-7   - 1-12: A.m. labs stable, continue to encourage p.o. fluids, blood pressure holding up better   - BUN and creatinine stable    12.  Tachycardia/hypertension: Continue Lopressor  25 mg twice daily.               - 1/7: Hypotensive with therapies + tachycardic - 500 cc IVF bolus today--responsive   - 1/8: Remains tachycardic 120s; titrate pain regimen as above; repeat orthostats and add 75 cc/hr drip if +; encourage PO fluids - may need to increase lopressor   1/11 HR a little better this morning (105) -continue lopressor  at 50mg  bid to see if this better controls without dropping bp too much -rx pain and anxiety as above 1/12-19: Improving with pain control, monitor. 1-20: Reducing pain medications tomorrow morning, monitor for recurrent tachycardia, then wean Lopressor  if tolerated. 1-22: Reduce Lopressor  to 25 mg twice daily 1-23: Lopressor  to 12.3 mg twice daily, if heart rate and  blood pressure stays stable can DC Lopressor  1/24 -1/24 stable, DC Lopressor  -heart rate stable continue current regimen and monitor     02/22/2024    5:02 AM 02/21/2024    8:32 PM 02/21/2024   12:55 PM  Vitals with BMI  Systolic 124 135 869  Diastolic 59 83 87  Pulse 75 100 101      13.  Urinary retention: TOV failed 1/1 & 1/5. Foley replaced on 1/6 - Continue Flomax .  1/7 Flomax  moved to at bedtime due to orthostasis.  1/8: + BM yesterday; will plan foley trial Monday 1/12 --order 1/13-14: Continues with high volume output but has been continent of all voids, continue current regimen -1-15 urinary retention resolved, continent of all voids.  Will continue Flomax  for now given ongoing high-volume output and poor mobility. 1-17: High bladder scan yesterday, increase Flomax  to 0.8 mg after supper. 1-18: PVRs low.  Patient endorsing difficulty voiding only when he is laying down.  Encouraged upright  seated position or bedside commode for voids.  DC PVRs. - Voiding continent on bedside commode on current regimen; would not reduce Flomax  until mobility improves as patient still struggles with urinating intermittently but improves with upright positioning.  14.  Constipation: received mag citrate 1/6 LBM unsure of success. KUB ordered.              -Miralax  increased to BID. Colace BID, as needed enema.              1/7: Straining with orthostasis - give bowel prep  1/8: medium BM with suppository and prep - schedule miralax  17 g BID, sennakot S 1 tab BID; encourage PO fluids as above  1-9: Increase Senokot to 2 tabs twice daily   - Multiple large BMs 1-11.   1-13: Patient endorses trouble using bedpan as huge source of anxiety, encouraged to practice transfers to assist in changing this. 1/14: Monitor through today; may need sorbitol if no BM tonight Last bowel movement 1-14, large.  Still requiring enema. 1-15: Increase Senokot-S to 3 tabs twice daily, continue MiraLAX  17 g twice daily,  add as needed lactulose  10 g for moderate constipation.  1-17 medium bowel movement  1-18: 2 bowel movements yesterday, starting to pick up.  Reduce Senokot-S to 1 tab twice daily  Large bowel movement 1-25-large  15. Right 4 through 7, left 9 through 10 rib fractures w/pulmonary contusions: Continue pain control and pulmonary toilet.             - Encourage incentive spirometer and flutter valve.  16. L2-5 transverse process fractures - Pain control. 17. Right scapular spine fracture - Dr. Reyne consulted. This to be treated nonoperatively.  18. Right femur fracture - s/p IM rod 12/30 Dr. Reyne.   - 1/14: RLE knee immobilizer, ok for hip ROM; WBAT for transfers only in boot and KI  19. Open right tib/fib fracture - s/p IM rod right tibia 12/30 Dr. Reyne, ORIF R medial malleolus, Right partial patellectomy Dr. Josefina.   - 1/14: WBAT for tranfers only in boot and KI  20. Left humeral fracture - ORIF 12/31 Dr. Kendal.   - 1/14: WBAT w/ radial nerve palsy splint  21. Left femur fracture - s/p IM rod 12/30 Dr. Josefina.  - 1/ 26: Patient concerned about persistent swelling over fracture site; will get x-ray today, but provided reassurance this is likely hematoma -1/27: Kemp Gerard Eke PA reviewed images, no migration of bone fragments and again swelling is likely hematoma/seroma.  Recommend ice, heat, compression and lidocaine  patches to help with local symptoms.  Will reevaluate as outpatient.  Reviewed this plan with patient today.  22. Left foot fractures - s/p closed reduction left toes 12/30. 23. Left tibial plateau fracture -  washed out 12/30 Dr. Reyne, ORIF 12/31 Hr. Haddix.     - 1/14: LLE unrestricted ROM, NWB  24. Obesity. Body mass index is 32.13 kg/m. Complicates all aspects of care.   LOS: 21 days A FACE TO FACE EVALUATION WAS PERFORMED  Joesph JAYSON Likes 02/22/2024, 9:23 AM     "

## 2024-02-22 NOTE — Patient Care Conference (Signed)
 Inpatient RehabilitationTeam Conference and Plan of Care Update Date: 02/22/2024   Time: 1044 am    Patient Name: Aaron Herrera      Medical Record Number: [id number]  Date of Birth: [birth year] Sex: Male         Room/Bed: 4W04C/4W04C-01 Payor Info: Payor: [place] MEDICAID PREPAID HEALTH PLAN / Plan: [place] MEDICAID HEALTHY BLUE / Product Type: *No Product type* /    Admit Date/Time:  02/01/2024  5:33 PM  Primary Diagnosis:  Critical polytrauma  Hospital Problems: Principal Problem:   Critical polytrauma Active Problems:   Adjustment disorder with depressed mood    Expected Discharge Date: Expected Discharge Date: 02/26/24  Team Members Present: Physician leading conference: Dr. Joesph Likes Social Worker Present: Graeme Jude, LCSW Nurse Present: Eulalio Falls, RN PT Present: Catilin Osborn, PT OT Present: Nereida Habermann, OT SLP Present: Recardo Mole, SLP     Current Status/Progress Goal Weekly Team Focus  Bowel/Bladder   Pt is continent of both bowel and bladder. LBM is on 1/27   Patient will remain continent of bowel and bladder.   Assess for incontinence, constipation,and dysfunction.    Swallow/Nutrition/ Hydration               ADL's   Mod I for UB care; Setup-Min A for LB ADLs (increased time + use of AE).   Min A   Discharge planning    Mobility   supervision bed mob, supervision/modI slide board transfer, WC mob 150' modI   supervision transfers  Barriers: R knee pain and WB tolerance; Focus - sit<>stands, pt/family education, R LE WB tolerance, stand pivot transfers    Communication                Safety/Cognition/ Behavioral Observations  met all ST goals 1/27   modI   n/a d/c'd from ST 1/27    Pain   Patient reports right leg pain rated 7/10. Patient has been given Oxycontin  during this shift.   Pt will be free of pain   Continue to assess pt for any signs of pain. Implement pain management when needed. Monitor for worsening pain  levels.    Skin   Patient has surgical incisions to the right and left leg and left upper arm.   Monitor for signs of infection. Optimize wound repair to boost the healing process.  Routine skin assessmentsand follow wound care orders.      Discharge Planning:  Pt d/c to home with his parents. Mother is not able to provide any physical assistance. Pt father works and helpful in the evening due to work. PCS referral submitted to insurance. Wife reports ramp will likely not be in by discharge and more like Friday. SW will confirm there are no barriers to discharge.    Team Discussion: Patient was admitted post polytrauma. Patient with  pain, swelling on fracture site : medication and treatments adjusted by MD. Patient progress limited by weight bearing precautions, weight bearing tolerance and anxiety.     Patient on target to meet rehab goals: Patient graduated from speech therapy. Patient needs mod I assistance with upper body care and set up to min assistance with lower  body care with increase time and use of AE. Patient needs supervision - mod I assistance with slide board transfers. Patient was able to do wheelchair mobility up to 150' with mod I assistance. Goals at discharge are set for min- supervision assistance.  *See Care Plan and progress notes for long and short-term  goals.   Revisions to Treatment Plan:  Ortho consult  Ice therapy Compression Heat therapy  Teaching Needs: Safety, medications, wound care, transfers. toileting, etc   Current Barriers to Discharge: Decreased caregiver support, Home enviroment access/layout, Wound care, and Weight bearing restrictions  Possible Resolutions to Barriers: Family Education PCS referral DME: BSC, hospital bed, wheelchair     Medical Summary Current Status: Medically complicated by polytrauma with bilateral lower extremity fractures, complex wound care,uncontrolled pain, anxiety  Barriers to Discharge: Self-care  education;Uncontrolled Pain;Weight bearing restrictions;Behavior/Mood   Possible Resolutions to [name]: Monitor wounds and offload for healing, titrate anxiety medications and provide support, downtritrate pain regimen this week   Continued Need for Acute Rehabilitation Level of Care: The patient requires daily medical management by a physician with specialized training in physical medicine and rehabilitation for the following reasons: Direction of a multidisciplinary physical rehabilitation program to maximize functional independence : Yes Medical management of patient stability for increased activity during participation in an intensive rehabilitation regime.: Yes Analysis of laboratory values and/or radiology reports with any subsequent need for medication adjustment and/or medical intervention. : Yes   I attest that I was present, lead the team conference, and concur with the assessment and plan of the team.   Yizel Canby Gayo 02/23/2024, 1044 am

## 2024-02-22 NOTE — Plan of Care (Signed)
" °  Problem: Consults Goal: RH GENERAL PATIENT EDUCATION Description: See Patient Education module for education specifics. Outcome: Progressing Goal: Skin Care Protocol Initiated - if Braden Score 18 or less Description: If consults are not indicated, leave blank or document N/A Outcome: Progressing Goal: Nutrition Consult-if indicated Outcome: Progressing   Problem: RH BOWEL ELIMINATION Goal: RH STG MANAGE BOWEL WITH ASSISTANCE Description: STG Manage Bowel with mod I Assistance. Outcome: Progressing Goal: RH STG MANAGE BOWEL W/MEDICATION W/ASSISTANCE Description: STG Manage Bowel with Medication with mod I Assistance. Outcome: Progressing   Problem: RH BLADDER ELIMINATION Goal: RH STG MANAGE BLADDER WITH ASSISTANCE Description: STG Manage Bladder With min  Assistance Outcome: Progressing Goal: RH STG MANAGE BLADDER WITH MEDICATION WITH ASSISTANCE Description: STG Manage Bladder With Medication With mod I Assistance. Outcome: Progressing Goal: RH STG MANAGE BLADDER WITH EQUIPMENT WITH ASSISTANCE Description: STG Manage Bladder With Equipment With min Assistance Outcome: Progressing   Problem: RH SKIN INTEGRITY Goal: RH STG SKIN FREE OF INFECTION/BREAKDOWN Description: Manage skin without infection with min assist Outcome: Progressing   Problem: RH SAFETY Goal: RH STG ADHERE TO SAFETY PRECAUTIONS W/ASSISTANCE/DEVICE Description: STG Adhere to Safety Precautions With cues Assistance/Device. Outcome: Progressing   Problem: RH PAIN MANAGEMENT Goal: RH STG PAIN MANAGED AT OR BELOW PT'S PAIN GOAL Description: Pain < 4 with prns Outcome: Progressing   Problem: RH KNOWLEDGE DEFICIT GENERAL Goal: RH STG INCREASE KNOWLEDGE OF SELF CARE AFTER HOSPITALIZATION Description: Patient and parents will be able to manage care using educational resources for medications, skin care/ foley care and dietary modification independently Outcome: Progressing   "

## 2024-02-23 DIAGNOSIS — T07XXXA Unspecified multiple injuries, initial encounter: Secondary | ICD-10-CM | POA: Diagnosis not present

## 2024-02-23 NOTE — Plan of Care (Signed)
" °  Problem: Consults Goal: RH GENERAL PATIENT EDUCATION Description: See Patient Education module for education specifics. Outcome: Progressing Goal: Skin Care Protocol Initiated - if Braden Score 18 or less Description: If consults are not indicated, leave blank or document N/A Outcome: Progressing Goal: Nutrition Consult-if indicated Outcome: Progressing   Problem: RH BOWEL ELIMINATION Goal: RH STG MANAGE BOWEL WITH ASSISTANCE Description: STG Manage Bowel with mod I Assistance. Outcome: Progressing Goal: RH STG MANAGE BOWEL W/MEDICATION W/ASSISTANCE Description: STG Manage Bowel with Medication with mod I Assistance. Outcome: Progressing   Problem: RH BLADDER ELIMINATION Goal: RH STG MANAGE BLADDER WITH ASSISTANCE Description: STG Manage Bladder With min  Assistance Outcome: Progressing Goal: RH STG MANAGE BLADDER WITH MEDICATION WITH ASSISTANCE Description: STG Manage Bladder With Medication With mod I Assistance. Outcome: Progressing Goal: RH STG MANAGE BLADDER WITH EQUIPMENT WITH ASSISTANCE Description: STG Manage Bladder With Equipment With min Assistance Outcome: Progressing   Problem: RH SKIN INTEGRITY Goal: RH STG SKIN FREE OF INFECTION/BREAKDOWN Description: Manage skin without infection with min assist Outcome: Progressing   Problem: RH SAFETY Goal: RH STG ADHERE TO SAFETY PRECAUTIONS W/ASSISTANCE/DEVICE Description: STG Adhere to Safety Precautions With cues Assistance/Device. Outcome: Progressing   Problem: RH PAIN MANAGEMENT Goal: RH STG PAIN MANAGED AT OR BELOW PT'S PAIN GOAL Description: Pain < 4 with prns Outcome: Progressing   Problem: RH KNOWLEDGE DEFICIT GENERAL Goal: RH STG INCREASE KNOWLEDGE OF SELF CARE AFTER HOSPITALIZATION Description: Patient and parents will be able to manage care using educational resources for medications, skin care/ foley care and dietary modification independently Outcome: Progressing   "

## 2024-02-23 NOTE — Progress Notes (Signed)
 Physical Therapy Session Note  Patient Details  Name: Aaron Herrera MRN: [id number] Date of Birth: 21-Aug-2000  Today's Date: 02/23/2024 PT Individual Time: 0850-1001; 1119 - 1200 PT Individual Time Calculation (min): 71 min; 41 min   Short Term Goals: Week 3:  PT Short Term Goal 1 (Week 3): STG = LTG due to ELOS  SESSION 1 Skilled Therapeutic Interventions/Progress Updates: Patient sitting in WC on entrance to room. Patient alert and agreeable to PT session.   Patient reported unrated pain but received medication prior to arrival. Pt requested increased time at beginning to void bladder. On re-entry to room, pt stated that he could not void (attending physician arrived during session and made aware). Pt propelled WC from room<nsg station and reported that the skin rubbing/tearing is getting better  (L lateral aspect of 2nd digit noted to have some redness) from propelling WC. PTA transported pt rest of the way to main gym and donned B green theraband to improve pt's overall comfort and quality while propelling WC during inpatient stay. Pt propelled WC 150' with pt stating improvement in WC speed and decreased pain in hands. Pt performed slide board transfer from WC<>mat with supervision/modI. Pt performed multiple sit<>stand from elevated mat (up to 25-28') to RW with modA. First RW L handle not stationary and would cause pt's hand to slip off saddle grip. PTA provided another RW and adjusted height and donned co-band to L handle to keep it static. Pt noted improvement in ability to stand longer without needing to sit due to L hand slippage. Pt able to tolerate less than 15 seconds before needing to sit and adhered to WB status. Pt performed few mini hops back to mat due to need to have RW more forward to maintain R knee in extension. Pt cued to perform L heel slide to available ROM in pain-free zone with wash cloth on floor x 10. Pt transported back to room in [facility] dependently for time management.    Patient sitting in WC at end of session with brakes locked, and all needs within reach.  SESSION 2 Skilled Therapeutic Interventions/Progress Updates: Patient sitting in WC on entrance to room. Patient alert and agreeable to PT session.   Patient reported no pain.  Therapeutic Activity: Transfers: Pt performed slide board transfer from WC<elevated mat to simulate having to transfer to higher surfaces. Pt overall supervision and cued to navigate WC closer to mat, and to lock wheel close to mat and to push far wheel forward to achieve appropriate angle. Pt modI from mat <WC towards end of session with slide board transfer  Pt performed x 1 sit<>Stand from fully elevated mat height to RW with modA to improve tolerance to R LE WB. Pt performed mini hops back to mat due to having to have RW further out to maintain R knee in extension.  Therapeutic Exercise: Pt performed the following exercises with therapist providing the described cuing and facilitation for improvement. 3 x 10 LAQ to available ROM on L LE  3 x 10 L hip flexion to available ROM Boxes L ankle with pt cued to draw imaginary box with L 1st digit B elbow extension with red theraband and B UE close to lateral torso. 2 x 10  Patient sitting in WC at end of session with brakes locked, and all needs within reach.       Therapy Documentation Precautions:  Precautions Precautions: Fall Precaution/Restrictions Comments: watch BP Required Braces or Orthoses: Knee Immobilizer - Right, Other  Brace Knee Immobilizer - Right: On at all times Other Brace: R CAM boot, L post op shoe, L RNP splint, L wrist cock up splint Restrictions Weight Bearing Restrictions Per Provider Order: No RUE Weight Bearing Per Provider Order: Weight bearing as tolerated LUE Weight Bearing Per Provider Order: Weight bearing as tolerated RLE Weight Bearing Per Provider Order: Weight bearing as tolerated LLE Weight Bearing Per Provider Order: Non weight  bearing Other Position/Activity Restrictions: no ROM of R knee, NO ROM restrictions L knee, no shoulder ROM restrictions   Therapy/Group: Individual Therapy  Yoav Okane PTA 02/23/2024, 12:05 PM

## 2024-02-23 NOTE — Progress Notes (Signed)
 "                                                        PROGRESS NOTE   Subjective/Complaints:  No events overnight.   Vital stable. Denies any issues with skin breakdown or rubbing since right lower extremity was unwrapped, but does feel that the compression was helpful.  PT will reapply today.  He endorses more issues with urinary hesitancy today, had to push twice to urinate while sitting up on the bedside commode today.  Denies any dysuria, abdominal pain, fevers, chills, or nausea.  ROS: Patient denies fever, chills,  rash, sore throat, blurred vision, dizziness, nausea, vomiting, diarrhea, cough, shortness of breath or chest pain,   headache, or mood change.  + Anxiety + Bilateral knee pain + Urinary retention  Objective:   DG FEMUR MIN 2 VIEWS LEFT Result Date: 02/21/2024 CLINICAL DATA:  Left thigh swelling with painful, palpable area of mid shaft laterally EXAM: LEFT FEMUR 2 VIEWS COMPARISON:  Left knee radiograph dated 02/09/2024 FINDINGS: Postsurgical changes of femoral fixation with intramedullary rod. Partially imaged postsurgical changes maximal tibia. Femoral diaphyseal alignment is near anatomic. Comminuted fracture of the mid to distal femoral diaphysis with large displaced fracture fragment along the anterolateral mid thigh, likely corresponding to reported palpable area of clinical concern. IMPRESSION: Comminuted fracture of the mid to distal femoral diaphysis with large displaced fracture fragment along the anterolateral mid thigh, likely corresponding to reported palpable area of clinical concern. Electronically Signed   By: Limin  Xu M.D.   On: 02/21/2024 14:40     Recent Labs    02/21/24 0441  WBC 3.2*  HGB 11.8*  HCT 36.1*  PLT 282      Recent Labs    02/21/24 0441  NA 141  K 4.0  CL 103  CO2 25  GLUCOSE 81  BUN 14  CREATININE 0.82  CALCIUM  9.7       Intake/Output Summary (Last 24 hours) at 02/23/2024 0820 Last data filed at 02/23/2024  0810 Gross per 24 hour  Intake 1137 ml  Output 1350 ml  Net -213 ml        Physical Exam: Vital Signs Blood pressure 124/74, pulse 78, temperature 97.8 F (36.6 C), temperature source Oral, resp. rate 18, height 5' 11 (1.803 m), weight 104.5 kg, SpO2 98%.  Constitutional: No distress . Vital signs reviewed.  Sitting up in wheelchair.   Cardiovascular: RRR without murmur. No JVD  .  Respiratory/Chest: CTA Bilaterally without wheezes or rales. Normal effort    GI/Abdomen: BS +, non-tender, non-distended Ext: no clubbing, cyanosis; trace right lower extremity edema, left foot edema Psych: pleasant, cooperative. Calm  Neurologic Exam:    Sensory exam: revealed normal sensation in all dermatomal regions in bilateral lower extremities, right upper extremity, and with reduced sensation to light touch in L dorsal thumb-unchanged  Motor exam:  strength 5/5 throughout right upper extremity  LUE 4/5 FF, 1/5 FA, 0/5 WE, 4/5 EF, 4/5 EE, 4/5 SA   Antigravity against resistance bilateral lower extremities, approximately 4/5 throughout but complicated by bracing/weightbearing restrictions   MSK:  no apparent deformity on exam.   Skin: Incisions and abrasions on bilateral lower extremities healing well--all well-approximated and scabbed over.; right lower extremity gauze and Ace wrapping with some retained moisture and odor  under his current bracing, so these were removed.  Incisions left open to air.  Small, palpable area of hardness on left mid femur; mobile, slightly tender, nonpulsatile--unchanged in appearance   Physical exam unchanged from the above on reexamination 02/23/24    Assessment/Plan: 1. Functional deficits which require 3+ hours per day of interdisciplinary therapy in a comprehensive inpatient rehab setting. Physiatrist is providing close team supervision and 24 hour management of active medical problems listed below. Physiatrist and rehab team continue to assess barriers  to discharge/monitor patient progress toward functional and medical goals  Care Tool:  Bathing    Body parts bathed by patient: Left arm, Chest, Abdomen, Front perineal area, Right upper leg, Left upper leg, Right arm, Left lower leg, Face   Body parts bathed by helper: Buttocks Body parts n/a: Right lower leg   Bathing assist Assist Level: Minimal Assistance - Patient > 75%     Upper Body Dressing/Undressing Upper body dressing   What is the patient wearing?: Pull over shirt    Upper body assist Assist Level: Set up assist    Lower Body Dressing/Undressing Lower body dressing      What is the patient wearing?: Pants, Incontinence brief     Lower body assist Assist for lower body dressing: Moderate Assistance - Patient 50 - 74%     Toileting Toileting    Toileting assist Assist for toileting: 2 Helpers     Transfers Chair/bed transfer  Transfers assist  Chair/bed transfer activity did not occur: Safety/medical concerns (orthostatic hypotension)  Chair/bed transfer assist level: Total Assistance - Patient < 25%     Locomotion Ambulation   Ambulation assist   Ambulation activity did not occur: Safety/medical concerns          Walk 10 feet activity   Assist  Walk 10 feet activity did not occur: Safety/medical concerns        Walk 50 feet activity   Assist Walk 50 feet with 2 turns activity did not occur: Safety/medical concerns         Walk 150 feet activity   Assist Walk 150 feet activity did not occur: Safety/medical concerns         Walk 10 feet on uneven surface  activity   Assist Walk 10 feet on uneven surfaces activity did not occur: Safety/medical concerns         Wheelchair     Assist Is the patient using a wheelchair?: Yes Type of Wheelchair: Manual Wheelchair activity did not occur: Safety/medical concerns         Wheelchair 50 feet with 2 turns activity    Assist    Wheelchair 50 feet with 2  turns activity did not occur: Safety/medical concerns       Wheelchair 150 feet activity     Assist  Wheelchair 150 feet activity did not occur: Safety/medical concerns       Blood pressure 124/74, pulse 78, temperature 97.8 F (36.6 C), temperature source Oral, resp. rate 18, height 5' 11 (1.803 m), weight 104.5 kg, SpO2 98%.  Medical Problem List and Plan: 1. Functional deficits secondary to polytrauma             -patient may shower with surgical dressings covered             -ELOS/Goals: 12-16 days, Mod A PT/OT at [facility] level - 02/23/24, pending WC ramp being built--> 1/31 DC pending medicaid transport availability   -Continue CIR therapies including PT, OT,SLP  Weight bearing: WBAT B/L upper extremities. May weight bear on right leg for transfers in boot and knee immobilizer NWB Left leg and should wear post op shoe on left foot when transferring    Splinting: L radial nerve palsy splint (finger bands) during daytime hours PRN for exercise and functional tasks; SLEEP in black wrist cock-up splint and wear PRN during the day when not wearing other splint to prevent wrist drop.    - 1/13: DC home with parents, mom primary caregiver and concerned that she can;t Do much; dad works during daytime. Max A bed mobility very limited by pain and anxiety - up to EOB yesterday - trying slideboard transfer today.  Min A UB, Max A LB care. Downgrading goals to Min A. SLP mild cognitive deficits  - SPV to ind with memory as biuggest deficit.    -1-15: Weightbearing status per orthopedics as below.    To follow-up with Dr. Kendal 2 weeks after DC for x-rays of the left humerus and left knee, and Dr.Gebauer/Landau for bilateral tibia left femur and left toe fractures  -1/20: Mom working on remodeling their home, widening doorways for his WC. SPV bed mobility, Min A slideboard transfers due to need to place but once down he is supervision, SPV WC ambulation, STS Max A 2x yesterday. Goal SPT  for car and bed transfers. Setup UB care, Mod-Max A LB care due to pain but is doing well with adaptive techniques with the reacher this week. [employer] putting in temporary ramp--discussed keeping in touch with us  on a date that this will be completed to help coordinate discharge plans.  - 1-23: Car transfer not feasible due to height; arranging medical transport for discharge. - 1/27: Honing in on STS - able to hop backwards and side to side with walker; working on car transfer. Using all adaptive equipment for ADLs, Graduated from SLP.    Patient would benefit from a hospital bed at discharge due to ongoing inability to transfer off of low surfaces and significant mobility difficulty in bed secondary to bilateral lower extremity fractures with left lower extremity remaining nonweightbearing.  In addition, he has multiple traumatic wounds in various stages of healing, and would benefit from a bed that can assist in pressure offloading and repositioning with adjusting recline/head height.   2.  Antithrombotics: -DVT/anticoagulation:  Mechanical: Sequential compression devices, below knee Bilateral lower extremities Pharmaceutical: Lovenox              -antiplatelet therapy: n/a             -BL LE duplex dopplers negative   - 1-15 per Ortho, transition to Eliquis  2.5 mg twice daily for 30 days for DVT prophylaxis at discharge - will start transition to Eliquis  today for patient comfort on 1-17; last Lovenox  dose tonight 1/16--tolerating   3. Pain Management: Tylenol  1000 mg q6h, gabapentin  300 mg 3 times daily.  Oxycodone  10 mg q4h and  Robaxin  500 mg q6 PRN.   1/8: Reduce tylenol  to 1000 mg TID, schedule robaxin  500 mg QID, schedule oxycodone  5 mg QID and reduce PRN to 5 mg Q6H PRN  1/11 still chasing pain at times. Pain wakes him up overnight   -will change scheduled oxycodone  5 q6 to oxycontin , titrate that if needed.   - 1-12: Monitor on OxyContin   - 1-13: Increase Robaxin  to 1000 mg every  6 hours scheduled, add tizanidine  2 mg every 6 hours as needed, and encouraged use of as needed oxycodone  prior  to therapies  - 1/14: Increase PRN oxycodone  to 5-10 mg for moderate to severe pain; increase at bedtime gabapentin  to 600 mg to assist in at bedtime control  1-15: Using mostly oxycodone  10 overnight, pain remains 6-7 and interrupting sleep.  Results with increase gabapentin .  We discussed the patient today adding Elavil  versus scheduling tizanidine --will add Elavil  25 mg nightly.  1-16: Slept great with Elavil , continue current regimen  1-18: Doing well with current pain regimen, would start weaning OxyContin  this coming week  1/19 has not been using frequent oxycodone , reports pain is controlled.  Will continue current regimen for for today, could consider discontinue OxyContin  in the next day or two  - 1-22: DC OxyContin  this a.m.; monitor oxycodone  use over the next few days to gradually wean  1-23: Is using oxycodone  about once per day; reduce Robaxin  to 1000 mg 3 times daily, gabapentin  to 300 mg 3 times daily.  DC tizanidine .  Monitor oxycodone  use, will likely give short supply at discharge and then wean as outpatient.  -1/24 only used oxycodone  once last night and once earlier this morning.  Continue current regimen and monitor  -1/25 pain overall controlled, using as needed oxycodone  about daily--wishes to continue current regimen  4. Mood/Behavior/Sleep: LCSW to follow for evaluation and support when available.              -anxiety: d/t poor sleep, worry, and claustrophobia; denies nightmares or re-living traumatic event. Continue hydroxyzine  at bedtime.              -sleep: melatonin 5 mg at bedtime.   - 1-8: Not using as needed Atarax , continue nightly scheduled and DC 4 times daily as needed.  If anxiety ongoing limiting factor, will discuss initiation of BuSpar .  1/9-10: Started BuSpar  7.5 mg twice daily.  Encouraged use of as needed Atarax  at night if needed--monitor this  weekend.  1/11- some improvement but will increase to 10mg  bid as anxiety still a factor --monitor 1 to 2 days on increased regimen 1-13: Patient endorses this is generally well-controlled, major triggers using bedpan but otherwise doing better 1/14: Sleep interruptions due to pain, melatonin not effective, change to PRN trazodone  50 mg at bedtime. Consider adding elavil  at bedtime if gabapentin  increase not effective.  1-15: Adding Elavil  25 mg nightly-did well with this, slightly groggy this morning but tolerable. --1/26: Discussed moving Atarax  to as needed, patient wishes to keep scheduled at nighttime, no additional adjustments.  Can titrate further as outpatient.   5. Neuropsych/cognition: Psych consult ordered 1/6. Neuropsych Consult ordered in CIR.  This patient is capable of making decisions on his own behalf.  - Graduated from SLP   6. Skin/Wound Care: Routine pressure relief measures.               - 1 week post-op; will reach out to orthopedic surgical teams regarding timeline for suture/staple removal, RLE wrapping   -- Per Dr. Reyne, okay to remove sutures/staples. Ordered 1-8 - 1/9: Per Lauraine Moores, left humerus and right tibial incisions can be left open to air.  Per Dr. Josefina, right ankle dressing to remain intact unless saturated.   1-13: Remove remaining staples in the left proximal thigh and right knee--reminded nursing on 1-14 1-14: Ortho eval today:  LUE - ok to leave incision open to air LLE - ok to leave knee incision open to air RLE - Staples to be removed today and dressing change PRN 1-16: Nursing to remove residual staples/sutures on  right ankle  1-22: Unwrapped and looked at bilateral lower extremities; all areas healing well 1-27: Right lower extremity unwrapped, concern for sweating/moisture retention and odor under current bandaging so we will leave open to air for now.  Informed nursing, can reinforce specific areas with Mepilex if there is any rubbing  against the brace--patient can rewrap right lower extremity as needed due to liking the compression.    7. Fluids/Electrolytes/Nutrition: Monitor strict I&O and weights. Follow up labs CBC/CMP               -Regular diet + Ensure and vitamin supplements              - Good p.o. intakes overall   8.  Splenic laceration: 2 cm, monitor hemoglobin with CBC.   9.  Possible liver laceration: Small left zone 2/3 retroperitoneal hematoma.  Same as above   10.  ABLA: S/p 4 units PRBC, 1 unit whole blood, 2 unit FFP on 12/30.  Hemoglobin 8.9 from 8.8.  PLTs improved now to 63.               - 1-7: Stable on admission labs, follow-up labs   1-19 hemoglobin stable 10.4  11.  AKI: Related to hypovolemia on admission, resolved.  Encourage oral hydration. Monitor BMP               - 1/7: BUN/creatinine stable; monitor, stable at 1-7   - 1-12: A.m. labs stable, continue to encourage p.o. fluids, blood pressure holding up better   - BUN and creatinine stable    12.  Tachycardia/hypertension: Continue Lopressor  25 mg twice daily.               - 1/7: Hypotensive with therapies + tachycardic - 500 cc IVF bolus today--responsive   - 1/8: Remains tachycardic 120s; titrate pain regimen as above; repeat orthostats and add 75 cc/hr drip if +; encourage PO fluids - may need to increase lopressor   1/11 HR a little better this morning (105) -continue lopressor  at 50mg  bid to see if this better controls without dropping bp too much -rx pain and anxiety as above 1/12-19: Improving with pain control, monitor. 1-20: Reducing pain medications tomorrow morning, monitor for recurrent tachycardia, then wean Lopressor  if tolerated. 1-22: Reduce Lopressor  to 25 mg twice daily 1-23: Lopressor  to 12.3 mg twice daily, if heart rate and blood pressure stays stable can DC Lopressor  1/24 -1/24 stable, DC Lopressor  -heart rate stable continue current regimen and monitor     02/23/2024    6:07 AM 02/22/2024    8:18 PM  02/22/2024   12:45 PM  Vitals with BMI  Systolic 124 135 858  Diastolic 74 71 92  Pulse 78 98 102      13.  Urinary retention: TOV failed 1/1 & 1/5. Foley replaced on 1/6 - Continue Flomax .  1/7 Flomax  moved to at bedtime due to orthostasis.  1/8: + BM yesterday; will plan foley trial Monday 1/12 --order 1/13-14: Continues with high volume output but has been continent of all voids, continue current regimen -1-15 urinary retention resolved, continent of all voids.  Will continue Flomax  for now given ongoing high-volume output and poor mobility. 1-17: High bladder scan yesterday, increase Flomax  to 0.8 mg after supper. 1-18: PVRs low.  Patient endorsing difficulty voiding only when he is laying down.  Encouraged upright seated position or bedside commode for voids.  DC PVRs. - 1-28: Some increased difficulty with urination today.  Will resume  PVRs, no symptoms indicative of UTI.  14.  Constipation: received mag citrate 1/6 LBM unsure of success. KUB ordered.              -Miralax  increased to BID. Colace BID, as needed enema.              1/7: Straining with orthostasis - give bowel prep  1/8: medium BM with suppository and prep - schedule miralax  17 g BID, sennakot S 1 tab BID; encourage PO fluids as above  1-9: Increase Senokot to 2 tabs twice daily   - Multiple large BMs 1-11.   1-13: Patient endorses trouble using bedpan as huge source of anxiety, encouraged to practice transfers to assist in changing this. 1/14: Monitor through today; may need sorbitol if no BM tonight Last bowel movement 1-14, large.  Still requiring enema. 1-15: Increase Senokot-S to 3 tabs twice daily, continue MiraLAX  17 g twice daily, add as needed lactulose  10 g for moderate constipation.  1-17 medium bowel movement  1-18: 2 bowel movements yesterday, starting to pick up.  Reduce Senokot-S to 1 tab twice daily  Last bowel movement 1-26  15. Right 4 through 7, left 9 through 10 rib fractures w/pulmonary  contusions: Continue pain control and pulmonary toilet.             - Encourage incentive spirometer and flutter valve.  16. L2-5 transverse process fractures - Pain control. 17. Right scapular spine fracture - Dr. Reyne consulted. This to be treated nonoperatively.  18. Right femur fracture - s/p IM rod 12/30 Dr. Reyne.   - 1/14: RLE knee immobilizer, ok for hip ROM; WBAT for transfers only in boot and KI  19. Open right tib/fib fracture - s/p IM rod right tibia 12/30 Dr. Reyne, ORIF R medial malleolus, Right partial patellectomy Dr. Josefina.   - 1/14: WBAT for tranfers only in boot and KI  20. Left humeral fracture - ORIF 12/31 Dr. Kendal.   - 1/14: WBAT w/ radial nerve palsy splint  21. Left femur fracture - s/p IM rod 12/30 Dr. Josefina.  - 1/ 26: Patient concerned about persistent swelling over fracture site; will get x-ray today, but provided reassurance this is likely hematoma -1/27: Kemp Gerard Eke PA reviewed images, no migration of bone fragments and again swelling is likely hematoma/seroma.  Recommend ice, heat, compression and lidocaine  patches to help with local symptoms.  Will reevaluate as outpatient.  Reviewed this plan with patient today.  22. Left foot fractures - s/p closed reduction left toes 12/30. 23. Left tibial plateau fracture -  washed out 12/30 Dr. Reyne, ORIF 12/31 Hr. Haddix.     - 1/14: LLE unrestricted ROM, NWB  24. Obesity. Body mass index is 32.13 kg/m. Complicates all aspects of care.   LOS: 22 days A FACE TO FACE EVALUATION WAS PERFORMED  Joesph JAYSON Likes 02/23/2024, 8:20 AM     "

## 2024-02-23 NOTE — Progress Notes (Signed)
 Occupational Therapy Session Note  Patient Details  Name: [name] MRN: [id number] Date of Birth: 2000/12/13  Today's Date: 02/23/2024 OT Individual Time: 9194-9151 OT Individual Time Calculation (min): 43 min   Short Term Goals: Week 3:  OT Short Term Goal 1 (Week 3): Pt will complete posterior pericare with Min A + PRN AE. OT Short Term Goal 2 (Week 3): Pt will perform sit<>stand with Mod A in preparation for standing ADLs. OT Short Term Goal 3 (Week 3): Pt will maintain static standce with Min A in preparation for standing ADLs.  Skilled Therapeutic Interventions/Progress Updates:  Pt greeted in bed for skilled OT session with focus on UB and LB self care (bathing/dressing). Pt was encouraged to direct self care tasks in preparation for d/c.  Pain: Pt with no reports of pain. OT offering intermediate rest breaks and positioning suggestions throughout session to address pain/fatigue and maximize participation/safety in session.  Functional Transfers: Pt completed bed mobility with supervision + heavy use of bed features (bed rails, HOB elevated). Pt completed SB transfer from EOB<>WC with supervision. Pt continues to require cuing to wear wrist cock-up splint when WB through LUE during transfers to maintain integrity of distal extremity.   Self Care Tasks: Pt completes the following self care tasks with levels of assistance noted below, UB: Pt completed UB bathing in seated in WC at the sink. Pt completed majority of UB bathing with set-up assist. Min A was required for reaching upper back.  LB: Pt completed LB bathing at EOB with set-up assist. A long-handled sponge was implemented for distal LLE cleanse. Pt completed LB dressing utilizing a reacher with Min A required for RLE threading due to material entanglement and surgical bracing. Pt required increased time and Min A for hiking LB garments over bottom.  Pt was provided with handout for purchase of bath sponges (energy  conservation) and briefs in preparation for d/c.  Pt remained in WC, urinal in hand for self-toileting, 4Ps assessed and immediate needs met. Pt continues to be appropriate for skilled OT intervention to promote further functional independence in ADLs/IADLs.   Therapy Documentation Precautions:  Precautions Precautions: Fall Precaution/Restrictions Comments: watch BP Required Braces or Orthoses: Knee Immobilizer - Right, Other Brace Knee Immobilizer - Right: On at all times Other Brace: R CAM boot, L post op shoe, L RNP splint, L wrist cock up splint Restrictions Weight Bearing Restrictions Per Provider Order: No RUE Weight Bearing Per Provider Order: Weight bearing as tolerated LUE Weight Bearing Per Provider Order: Weight bearing as tolerated RLE Weight Bearing Per Provider Order: Weight bearing as tolerated LLE Weight Bearing Per Provider Order: Non weight bearing Other Position/Activity Restrictions: no ROM of R knee, NO ROM restrictions L knee, no shoulder ROM restrictions  Therapy/Group: Individual Therapy  Misty Pacini, OTS 02/23/2024, 7:47 AM

## 2024-02-24 DIAGNOSIS — S42309A Unspecified fracture of shaft of humerus, unspecified arm, initial encounter for closed fracture: Secondary | ICD-10-CM | POA: Diagnosis present

## 2024-02-24 DIAGNOSIS — S93105A Unspecified dislocation of left toe(s), initial encounter: Secondary | ICD-10-CM | POA: Diagnosis present

## 2024-02-24 DIAGNOSIS — F411 Generalized anxiety disorder: Secondary | ICD-10-CM | POA: Diagnosis present

## 2024-02-24 DIAGNOSIS — S92302A Fracture of unspecified metatarsal bone(s), left foot, initial encounter for closed fracture: Secondary | ICD-10-CM | POA: Diagnosis present

## 2024-02-24 DIAGNOSIS — K5900 Constipation, unspecified: Secondary | ICD-10-CM | POA: Diagnosis present

## 2024-02-24 DIAGNOSIS — S82132A Displaced fracture of medial condyle of left tibia, initial encounter for closed fracture: Secondary | ICD-10-CM | POA: Diagnosis present

## 2024-02-24 DIAGNOSIS — S27329A Contusion of lung, unspecified, initial encounter: Secondary | ICD-10-CM | POA: Diagnosis present

## 2024-02-24 DIAGNOSIS — S7291XA Unspecified fracture of right femur, initial encounter for closed fracture: Secondary | ICD-10-CM | POA: Diagnosis present

## 2024-02-24 DIAGNOSIS — T07XXXA Unspecified multiple injuries, initial encounter: Secondary | ICD-10-CM | POA: Diagnosis not present

## 2024-02-24 DIAGNOSIS — S7292XA Unspecified fracture of left femur, initial encounter for closed fracture: Secondary | ICD-10-CM | POA: Diagnosis present

## 2024-02-24 DIAGNOSIS — R339 Retention of urine, unspecified: Secondary | ICD-10-CM | POA: Diagnosis present

## 2024-02-24 DIAGNOSIS — I472 Ventricular tachycardia, unspecified: Secondary | ICD-10-CM | POA: Diagnosis present

## 2024-02-24 DIAGNOSIS — N179 Acute kidney failure, unspecified: Secondary | ICD-10-CM | POA: Diagnosis present

## 2024-02-24 DIAGNOSIS — J302 Other seasonal allergic rhinitis: Secondary | ICD-10-CM | POA: Diagnosis present

## 2024-02-24 DIAGNOSIS — S2249XA Multiple fractures of ribs, unspecified side, initial encounter for closed fracture: Secondary | ICD-10-CM | POA: Diagnosis present

## 2024-02-24 DIAGNOSIS — S82401B Unspecified fracture of shaft of right fibula, initial encounter for open fracture type I or II: Secondary | ICD-10-CM | POA: Diagnosis present

## 2024-02-24 DIAGNOSIS — D62 Acute posthemorrhagic anemia: Secondary | ICD-10-CM | POA: Diagnosis present

## 2024-02-24 DIAGNOSIS — S36039A Unspecified laceration of spleen, initial encounter: Secondary | ICD-10-CM | POA: Diagnosis present

## 2024-02-24 DIAGNOSIS — G5632 Lesion of radial nerve, left upper limb: Secondary | ICD-10-CM | POA: Diagnosis present

## 2024-02-24 DIAGNOSIS — E66811 Obesity, class 1: Secondary | ICD-10-CM | POA: Diagnosis present

## 2024-02-24 DIAGNOSIS — S42109A Fracture of unspecified part of scapula, unspecified shoulder, initial encounter for closed fracture: Secondary | ICD-10-CM | POA: Diagnosis present

## 2024-02-24 LAB — CBC
HCT: 35.8 % — ABNORMAL LOW (ref 39.0–52.0)
Hemoglobin: 11.6 g/dL — ABNORMAL LOW (ref 13.0–17.0)
MCH: 30.1 pg (ref 26.0–34.0)
MCHC: 32.4 g/dL (ref 30.0–36.0)
MCV: 92.7 fL (ref 80.0–100.0)
Platelets: 235 10*3/uL (ref 150–400)
RBC: 3.86 MIL/uL — ABNORMAL LOW (ref 4.22–5.81)
RDW: 13.9 % (ref 11.5–15.5)
WBC: 2.8 10*3/uL — ABNORMAL LOW (ref 4.0–10.5)
nRBC: 0 % (ref 0.0–0.2)

## 2024-02-24 MED ORDER — GABAPENTIN 300 MG PO CAPS: 300.0000 mg | ORAL_CAPSULE | Freq: Three times a day (TID) | ORAL | 0 refills | Status: AC

## 2024-02-24 MED ORDER — ENSURE PLUS HIGH PROTEIN PO LIQD: 237.0000 mL | Freq: Two times a day (BID) | ORAL | Status: AC

## 2024-02-24 MED ORDER — AMITRIPTYLINE HCL 25 MG PO TABS: 25.0000 mg | ORAL_TABLET | Freq: Every day | ORAL | 0 refills | Status: AC

## 2024-02-24 MED ORDER — APIXABAN 2.5 MG PO TABS: 2.5000 mg | ORAL_TABLET | Freq: Two times a day (BID) | ORAL | 0 refills | Status: AC

## 2024-02-24 MED ORDER — SENNOSIDES-DOCUSATE SODIUM 8.6-50 MG PO TABS: 1.0000 | ORAL_TABLET | Freq: Two times a day (BID) | ORAL | 0 refills | Status: AC

## 2024-02-24 MED ORDER — ACETAMINOPHEN 500 MG PO TABS: 1000.0000 mg | ORAL_TABLET | Freq: Three times a day (TID) | ORAL | 0 refills | Status: AC

## 2024-02-24 MED ORDER — ASCORBIC ACID 1000 MG PO TABS: 1000.0000 mg | ORAL_TABLET | Freq: Every day | ORAL | Status: AC

## 2024-02-24 MED ORDER — TAMSULOSIN HCL 0.4 MG PO CAPS: 0.8000 mg | ORAL_CAPSULE | Freq: Every day | ORAL | 0 refills | Status: AC

## 2024-02-24 MED ORDER — VITAMIN D (ERGOCALCIFEROL) 1.25 MG (50000 UNIT) PO CAPS: 50000.0000 [IU] | ORAL_CAPSULE | ORAL | 0 refills | Status: AC

## 2024-02-24 MED ORDER — POLYETHYLENE GLYCOL 3350 17 G PO PACK: 17.0000 g | PACK | Freq: Two times a day (BID) | ORAL | 0 refills | Status: AC

## 2024-02-24 MED ORDER — BUSPIRONE HCL 10 MG PO TABS: 10.0000 mg | ORAL_TABLET | Freq: Two times a day (BID) | ORAL | 0 refills | Status: AC

## 2024-02-24 MED ORDER — METHOCARBAMOL 1000 MG PO TABS: 1000.0000 mg | ORAL_TABLET | Freq: Three times a day (TID) | ORAL | 0 refills | Status: AC

## 2024-02-24 MED ORDER — OXYCODONE HCL 5 MG PO TABS: 5.0000 mg | ORAL_TABLET | Freq: Four times a day (QID) | ORAL | 0 refills | Status: AC | PRN

## 2024-02-24 MED ORDER — HYDROXYZINE HCL 25 MG PO TABS: 25.0000 mg | ORAL_TABLET | Freq: Every evening | ORAL | 0 refills | Status: AC | PRN
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
## 2024-02-24 NOTE — Progress Notes (Signed)
 "                                                        PROGRESS NOTE   Subjective/Complaints:  No events overnight.   Vital stable.  Some pain in right ankle with mobility today but overall well-controlled. PVRs have been low, no urinary discomfort.  Discussed with patient's mom upcoming weather this weekend, agreeable with late discharge tomorrow with transport home.  Still pending some equipment delivery.  Patient has no concerns about this.    ROS: Patient denies fever, chills,  rash, sore throat, blurred vision, dizziness, nausea, vomiting, diarrhea, cough, shortness of breath or chest pain,   headache, or mood change.  + Anxiety + Bilateral knee pain, right ankle pain  Objective:   No results found.    Recent Labs    02/24/24 0438  WBC 2.8*  HGB 11.6*  HCT 35.8*  PLT 235      No results for input(s): NA, K, CL, CO2, GLUCOSE, BUN, CREATININE, CALCIUM  in the last 72 hours.      Intake/Output Summary (Last 24 hours) at 02/24/2024 0920 Last data filed at 02/24/2024 0702 Gross per 24 hour  Intake 657 ml  Output 1400 ml  Net -743 ml        Physical Exam: Vital Signs Blood pressure 124/63, pulse 84, temperature 97.7 F (36.5 C), temperature source Oral, resp. rate 17, height 5' 11 (1.803 m), weight 104.5 kg, SpO2 98%.  Constitutional: No distress . Vital signs reviewed.  Sitting up in bedside chair.   Cardiovascular: RRR without murmur. No JVD  .  Respiratory/Chest: CTA Bilaterally without wheezes or rales. Normal effort    GI/Abdomen: BS +, non-tender, non-distended Ext: no clubbing, cyanosis; trace right lower extremity edema, left foot edema Psych: pleasant, cooperative. Calm  Neurologic Exam:    Sensory exam: revealed normal sensation in all dermatomal regions in bilateral lower extremities, right upper extremity, and with reduced sensation to light touch in L dorsal thumb-unchanged  Motor exam:  strength 5/5 throughout right upper  extremity  LUE 4/5 FF, 1/5 FA, 0/5 WE, 4/5 EF, 4/5 EE, 4/5 SA   Antigravity against resistance bilateral lower extremities, approximately 4/5 throughout but complicated by bracing/weightbearing restrictions   MSK:  no apparent deformity on exam.   Skin: Incisions and abrasions on bilateral lower extremities healing well--all well-approximated and scabbed over.;  Right knee wrapped in Ace bandage and covered in bracing; no apparent breakdown or cyanosis.  Small, palpable area of hardness on left mid femur; mobile, slightly tender, nonpulsatile--unchanged   Physical exam unchanged from the above on reexamination 02/24/24    Assessment/Plan: 1. Functional deficits which require 3+ hours per day of interdisciplinary therapy in a comprehensive inpatient rehab setting. Physiatrist is providing close team supervision and 24 hour management of active medical problems listed below. Physiatrist and rehab team continue to assess barriers to discharge/monitor patient progress toward functional and medical goals  Care Tool:  Bathing    Body parts bathed by patient: Right arm, Left arm, Chest, Abdomen, Front perineal area, Buttocks, Right upper leg, Left upper leg, Left lower leg, Face (Use of long-handled sponge.)   Body parts bathed by helper: Buttocks Body parts n/a: Right lower leg (Due to surgical bracing.)   Bathing assist Assist Level: Set up assist  Upper Body Dressing/Undressing Upper body dressing   What is the patient wearing?: Pull over shirt    Upper body assist Assist Level: Independent with assistive device    Lower Body Dressing/Undressing Lower body dressing      What is the patient wearing?: Pants, Incontinence brief     Lower body assist Assist for lower body dressing: Minimal Assistance - Patient > 75%     Toileting Toileting    Toileting assist Assist for toileting: 2 Helpers     Transfers Chair/bed transfer  Transfers assist  Chair/bed transfer  activity did not occur: Safety/medical concerns (orthostatic hypotension)  Chair/bed transfer assist level: Total Assistance - Patient < 25%     Locomotion Ambulation   Ambulation assist   Ambulation activity did not occur: Safety/medical concerns          Walk 10 feet activity   Assist  Walk 10 feet activity did not occur: Safety/medical concerns        Walk 50 feet activity   Assist Walk 50 feet with 2 turns activity did not occur: Safety/medical concerns         Walk 150 feet activity   Assist Walk 150 feet activity did not occur: Safety/medical concerns         Walk 10 feet on uneven surface  activity   Assist Walk 10 feet on uneven surfaces activity did not occur: Safety/medical concerns         Wheelchair     Assist Is the patient using a wheelchair?: Yes Type of Wheelchair: Manual Wheelchair activity did not occur: Safety/medical concerns         Wheelchair 50 feet with 2 turns activity    Assist    Wheelchair 50 feet with 2 turns activity did not occur: Safety/medical concerns       Wheelchair 150 feet activity     Assist  Wheelchair 150 feet activity did not occur: Safety/medical concerns       Blood pressure 124/63, pulse 84, temperature 97.7 F (36.5 C), temperature source Oral, resp. rate 17, height 5' 11 (1.803 m), weight 104.5 kg, SpO2 98%.  Medical Problem List and Plan: 1. Functional deficits secondary to polytrauma             -patient may shower with surgical dressings covered             -ELOS/Goals: 12-16 days, Mod A PT/OT at [facility] level - 02/23/24, pending WC ramp being built--> 1/30 DC pending transport/equipment  -Continue CIR therapies including PT, OT,SLP    Weight bearing: WBAT B/L upper extremities. May weight bear on right leg for transfers in boot and knee immobilizer NWB Left leg and should wear post op shoe on left foot when transferring    Splinting: L radial nerve palsy splint (finger  bands) during daytime hours PRN for exercise and functional tasks; SLEEP in black wrist cock-up splint and wear PRN during the day when not wearing other splint to prevent wrist drop.    - 1/13: DC home with parents, mom primary caregiver and concerned that she can;t Do much; dad works during daytime. Max A bed mobility very limited by pain and anxiety - up to EOB yesterday - trying slideboard transfer today.  Min A UB, Max A LB care. Downgrading goals to Min A. SLP mild cognitive deficits  - SPV to ind with memory as biuggest deficit.    -1-15: Weightbearing status per orthopedics as below.    To follow-up with  Dr. Kendal 2 weeks after DC for x-rays of the left humerus and left knee, and Dr.Gebauer/Landau for bilateral tibia left femur and left toe fractures  -1/20: Mom working on remodeling their home, widening doorways for his WC. SPV bed mobility, Min A slideboard transfers due to need to place but once down he is supervision, SPV WC ambulation, STS Max A 2x yesterday. Goal SPT for car and bed transfers. Setup UB care, Mod-Max A LB care due to pain but is doing well with adaptive techniques with the reacher this week. [employer] putting in temporary ramp--discussed keeping in touch with us  on a date that this will be completed to help coordinate discharge plans.  - 1-23: Car transfer not feasible due to height; arranging medical transport for discharge. - 1/27: Honing in on STS - able to hop backwards and side to side with walker; working on car transfer. Using all adaptive equipment for ADLs, Graduated from SLP.    Patient would benefit from a hospital bed at discharge due to ongoing inability to transfer off of low surfaces and significant mobility difficulty in bed secondary to bilateral lower extremity fractures with left lower extremity remaining nonweightbearing.  In addition, he has multiple traumatic wounds in various stages of healing, and would benefit from a bed that can assist in  pressure offloading and repositioning with adjusting recline/head height.  1-29: Moving discharge tomorrow to avoid inclement weather over the weekend.  Late transport arranged.   2.  Antithrombotics: -DVT/anticoagulation:  Mechanical: Sequential compression devices, below knee Bilateral lower extremities Pharmaceutical: Lovenox              -antiplatelet therapy: n/a             -BL LE duplex dopplers negative   - 1-15 per Ortho, transition to Eliquis  2.5 mg twice daily for 30 days for DVT prophylaxis at discharge - will start transition to Eliquis  today for patient comfort on 1-17; last Lovenox  dose tonight 1/16--tolerating   3. Pain Management: Tylenol  1000 mg q6h, gabapentin  300 mg 3 times daily.  Oxycodone  10 mg q4h and  Robaxin  500 mg q6 PRN.   1/8: Reduce tylenol  to 1000 mg TID, schedule robaxin  500 mg QID, schedule oxycodone  5 mg QID and reduce PRN to 5 mg Q6H PRN  1/11 still chasing pain at times. Pain wakes him up overnight   -will change scheduled oxycodone  5 q6 to oxycontin , titrate that if needed.   - 1-12: Monitor on OxyContin   - 1-13: Increase Robaxin  to 1000 mg every 6 hours scheduled, add tizanidine  2 mg every 6 hours as needed, and encouraged use of as needed oxycodone  prior to therapies  - 1/14: Increase PRN oxycodone  to 5-10 mg for moderate to severe pain; increase at bedtime gabapentin  to 600 mg to assist in at bedtime control  1-15: Using mostly oxycodone  10 overnight, pain remains 6-7 and interrupting sleep.  Results with increase gabapentin .  We discussed the patient today adding Elavil  versus scheduling tizanidine --will add Elavil  25 mg nightly.  1-16: Slept great with Elavil , continue current regimen  1-18: Doing well with current pain regimen, would start weaning OxyContin  this coming week  1/19 has not been using frequent oxycodone , reports pain is controlled.  Will continue current regimen for for today, could consider discontinue OxyContin  in the next day or two  -  1-22: DC OxyContin  this a.m.; monitor oxycodone  use over the next few days to gradually wean  1-23: Is using oxycodone  about once per day;  reduce Robaxin  to 1000 mg 3 times daily, gabapentin  to 300 mg 3 times daily.  DC tizanidine .  Monitor oxycodone  use, will likely give short supply at discharge and then wean as outpatient.  -1/24 only used oxycodone  once last night and once earlier this morning.  Continue current regimen and monitor  -1/25 pain overall controlled, using as needed oxycodone  about daily--wishes to continue current regimen--will give 1 week supply at discharge  4. Mood/Behavior/Sleep: LCSW to follow for evaluation and support when available.              -anxiety: d/t poor sleep, worry, and claustrophobia; denies nightmares or re-living traumatic event. Continue hydroxyzine  at bedtime.              -sleep: melatonin 5 mg at bedtime.   - 1-8: Not using as needed Atarax , continue nightly scheduled and DC 4 times daily as needed.  If anxiety ongoing limiting factor, will discuss initiation of BuSpar .  1/9-10: Started BuSpar  7.5 mg twice daily.  Encouraged use of as needed Atarax  at night if needed--monitor this weekend.  1/11- some improvement but will increase to 10mg  bid as anxiety still a factor --monitor 1 to 2 days on increased regimen 1-13: Patient endorses this is generally well-controlled, major triggers using bedpan but otherwise doing better 1/14: Sleep interruptions due to pain, melatonin not effective, change to PRN trazodone  50 mg at bedtime. Consider adding elavil  at bedtime if gabapentin  increase not effective.  1-15: Adding Elavil  25 mg nightly-did well with this, slightly groggy this morning but tolerable. --1/26: Discussed moving Atarax  to as needed, patient wishes to keep scheduled at nighttime, no additional adjustments.  Can titrate further as outpatient.   5. Neuropsych/cognition: Psych consult ordered 1/6. Neuropsych Consult ordered in CIR.  This patient is  capable of making decisions on his own behalf.  - Graduated from SLP   6. Skin/Wound Care: Routine pressure relief measures.               - 1 week post-op; will reach out to orthopedic surgical teams regarding timeline for suture/staple removal, RLE wrapping   -- Per Dr. Reyne, okay to remove sutures/staples. Ordered 1-8 - 1/9: Per Lauraine Moores, left humerus and right tibial incisions can be left open to air.  Per Dr. Josefina, right ankle dressing to remain intact unless saturated.   1-13: Remove remaining staples in the left proximal thigh and right knee--reminded nursing on 1-14 1-14: Ortho eval today:  LUE - ok to leave incision open to air LLE - ok to leave knee incision open to air RLE - Staples to be removed today and dressing change PRN 1-16: Nursing to remove residual staples/sutures on right ankle  1-22: Unwrapped and looked at bilateral lower extremities; all areas healing well 1-27: Right lower extremity unwrapped, concern for sweating/moisture retention and odor under current bandaging so we will leave open to air for now.  Informed nursing, can reinforce specific areas with Mepilex if there is any rubbing against the brace--patient can rewrap right lower extremity as needed due to liking the compression.  7. Fluids/Electrolytes/Nutrition: Monitor strict I&O and weights. Follow up labs CBC/CMP               -Regular diet + Ensure and vitamin supplements              - Good p.o. intakes overall   8.  Splenic laceration: 2 cm, monitor hemoglobin with CBC.   9.  Possible liver laceration: Small  left zone 2/3 retroperitoneal hematoma.  Same as above   10.  ABLA: S/p 4 units PRBC, 1 unit whole blood, 2 unit FFP on 12/30.  Hemoglobin 8.9 from 8.8.  PLTs improved now to 63.               - 1-7: Stable on admission labs, follow-up labs   1-19 hemoglobin stable 10.4  11.  AKI: Related to hypovolemia on admission, resolved.  Encourage oral hydration. Monitor BMP               -  1/7: BUN/creatinine stable; monitor, stable at 1-7   - 1-12: A.m. labs stable, continue to encourage p.o. fluids, blood pressure holding up better   - BUN and creatinine stable    12.  Tachycardia/hypertension: Continue Lopressor  25 mg twice daily.               - 1/7: Hypotensive with therapies + tachycardic - 500 cc IVF bolus today--responsive   - 1/8: Remains tachycardic 120s; titrate pain regimen as above; repeat orthostats and add 75 cc/hr drip if +; encourage PO fluids - may need to increase lopressor   1/11 HR a little better this morning (105) -continue lopressor  at 50mg  bid to see if this better controls without dropping bp too much -rx pain and anxiety as above 1/12-19: Improving with pain control, monitor. 1-20: Reducing pain medications tomorrow morning, monitor for recurrent tachycardia, then wean Lopressor  if tolerated. 1-22: Reduce Lopressor  to 25 mg twice daily 1-23: Lopressor  to 12.3 mg twice daily, if heart rate and blood pressure stays stable can DC Lopressor  1/24 -1/24 stable, DC Lopressor  -heart rate stable continue current regimen and monitor     02/24/2024    4:51 AM 02/23/2024    8:51 PM 02/23/2024    2:56 PM  Vitals with BMI  Systolic 124 127 860  Diastolic 63 86 87  Pulse 84 98 103      13.  Urinary retention: TOV failed 1/1 & 1/5. Foley replaced on 1/6 - Continue Flomax .  1/7 Flomax  moved to at bedtime due to orthostasis.  1/8: + BM yesterday; will plan foley trial Monday 1/12 --order 1/13-14: Continues with high volume output but has been continent of all voids, continue current regimen -1-15 urinary retention resolved, continent of all voids.  Will continue Flomax  for now given ongoing high-volume output and poor mobility. 1-17: High bladder scan yesterday, increase Flomax  to 0.8 mg after supper. 1-18: PVRs low.  Patient endorsing difficulty voiding only when he is laying down.  Encouraged upright seated position or bedside commode for voids.  DC PVRs. -  1-28: Some increased difficulty with urination today.  Will resume PVRs, no symptoms indicative of UTI.  14.  Constipation: received mag citrate 1/6 LBM unsure of success. KUB ordered.              -Miralax  increased to BID. Colace BID, as needed enema.              1/7: Straining with orthostasis - give bowel prep  1/8: medium BM with suppository and prep - schedule miralax  17 g BID, sennakot S 1 tab BID; encourage PO fluids as above  1-9: Increase Senokot to 2 tabs twice daily   - Multiple large BMs 1-11.   1-13: Patient endorses trouble using bedpan as huge source of anxiety, encouraged to practice transfers to assist in changing this. 1/14: Monitor through today; may need sorbitol if no BM tonight Last bowel movement  1-14, large.  Still requiring enema. 1-15: Increase Senokot-S to 3 tabs twice daily, continue MiraLAX  17 g twice daily, add as needed lactulose  10 g for moderate constipation.  1-17 medium bowel movement  1-18: 2 bowel movements yesterday, starting to pick up.  Reduce Senokot-S to 1 tab twice daily  Last bowel movement 1-28: Medium  15. Right 4 through 7, left 9 through 10 rib fractures w/pulmonary contusions: Continue pain control and pulmonary toilet.             - Encourage incentive spirometer and flutter valve.  16. L2-5 transverse process fractures - Pain control. 17. Right scapular spine fracture - Dr. Reyne consulted. This to be treated nonoperatively.  18. Right femur fracture - s/p IM rod 12/30 Dr. Reyne.   - 1/14: RLE knee immobilizer, ok for hip ROM; WBAT for transfers only in boot and KI  19. Open right tib/fib fracture - s/p IM rod right tibia 12/30 Dr. Reyne, ORIF R medial malleolus, Right partial patellectomy Dr. Josefina.   - 1/14: WBAT for tranfers only in boot and KI  20. Left humeral fracture - ORIF 12/31 Dr. Kendal.   - 1/14: WBAT w/ radial nerve palsy splint  21. Left femur fracture - s/p IM rod 12/30 Dr. Josefina.  - 1/ 26: Patient concerned  about persistent swelling over fracture site; will get x-ray today, but provided reassurance this is likely hematoma -1/27: Aaron Gerard Eke PA reviewed images, no migration of bone fragments and again swelling is likely hematoma/seroma.  Recommend ice, heat, compression and lidocaine  patches to help with local symptoms.  Will reevaluate as outpatient.  Reviewed this plan with patient today.  22. Left foot fractures - s/p closed reduction left toes 12/30. 23. Left tibial plateau fracture -  washed out 12/30 Dr. Reyne, ORIF 12/31 Hr. Haddix.     - 1/14: LLE unrestricted ROM, NWB  24. Obesity. Body mass index is 32.13 kg/m. Complicates all aspects of care.   LOS: 23 days A FACE TO FACE EVALUATION WAS PERFORMED  Aaron Herrera 02/24/2024, 9:20 AM     "

## 2024-02-24 NOTE — Progress Notes (Signed)
 Physical Therapy Session Note  Patient Details  Name: Aaron Herrera MRN: [id number] Date of Birth: Jun 19, 2000  Today's Date: 02/24/2024 PT Individual Time: 9194-9083 + 1005-1100 PT Individual Time Calculation (min): 71 min + 55 min  Short Term Goals: Week 3:  PT Short Term Goal 1 (Week 3): STG = LTG due to ELOS  Skilled Therapeutic Interventions/Progress Updates:    SESSION 1: Pt presents in room in bed, agreeable to PT. Pt reports premedicated, denies pain. Session focused on therapeutic exercises to complete as HEP at home as well as therapeutic activities for  bed mobility, transfer training and activity tolerance. Pt completes bed mobility and transfers modI with slideboard throughout session. Pt able to manage WC parts modI, self propels WC modI to day room and completes slideboard transfer to EOM. Pt completes short sitting to long sitting scooting posteriorly on mat then pt completes long sitting to supine modI. Pt completes supine to prone with supervision cues for sequencing for ease and efficiency with pt demonstrating understanding. Pt maintains prone position for 10 minutes while completing one set of prone exercises, noted below. Pt completes one set of all of the following exercises during session, cues provided for desired technique as well as progressions, adaptations, and frequency of exercises, provided with handout.  Access Code: [email or web address] URL: [email or web address] Date: 02/24/2024 Prepared by: Reche Ohara  Exercises - Supine March  - 1 x daily - 7 x weekly - 3 sets - 10 reps - Supine Straight Leg Raise  - 1 x daily - 7 x weekly - 3 sets - 10 reps - Prone Knee Flexion  - 1 x daily - 7 x weekly - 3 sets - 10 reps - Prone Hip Extension  - 1 x daily - 7 x weekly - 3 sets - 10 reps - Prone Gluteal Sets  - 1 x daily - 7 x weekly - 3 sets - 10 reps - Prone Press Up  - 1 x daily - 7 x weekly - 3 sets - 10 reps - Seated Long Arc Quad  - 1 x daily - 7 x weekly  - 3 sets - 10 reps - 3 hold - Seated Heel Slide  - 1 x daily - 7 x weekly - 3 sets - 10 reps - 3 hold  Pt completes one sit to stand with min assist from max elevated mat to RW, pt using BUEs on RW. Pt prompted to complete pushing from mat with BUEs to bring RLE underneath him however difficult to complete. Pt then completes sit to stand with mod assist to RW, requires heavy mod assist for stand pivot transfer with RW, mod assist x2 for stand to sit to WC. Pt returns to room and remains seated in [facility] with all needs within reach, cal light in place at end of session.     SESSION 2: Pt presents in room in [facility], agreeable to PT. Pt does not report pain at start of session. Session focused on therapeutic activities for transfer training for stand pivot transfers, therapeutic exercises to promote BUE/core strengthening for transfers and bed mobility. Pt self propels WC with BUEs modI. Pt able to manage parking WC for transfer and WC parts management modI. Pt then completes WC press up x10 prior to sit to stand training to promote improved body mechanics for transfer. Pt completes sit to stand and stand pivot transfer WC<>mat x3 trials with mod assist x2 for sit to stand, mod assist for postural stability for stand pivot  transfer. Pt completes therapeutic exercises for core/BUE strengthening in long sitting on mat including: - sit ups 2x10 - russian twists 2x20 4# med ball - straight arm lat pull down supine 2x10 4# med ball - skull crushers 2x10 6# med ball Pt completes slideboard transfer modI back to [facility] and self propels back to room modI where he remains seated in [facility] with all needs within reach, call light in place at end of session.     Therapy Documentation Precautions:  Precautions Precautions: Fall Precaution/Restrictions Comments: watch BP Required Braces or Orthoses: Knee Immobilizer - Right, Other Brace Knee Immobilizer - Right: On at all times Other Brace: R CAM boot, L post op shoe, L  RNP splint, L wrist cock up splint Restrictions Weight Bearing Restrictions Per Provider Order: Yes RUE Weight Bearing Per Provider Order: Weight bearing as tolerated LUE Weight Bearing Per Provider Order: Weight bearing as tolerated RLE Weight Bearing Per Provider Order: Weight bearing as tolerated LLE Weight Bearing Per Provider Order: Non weight bearing Other Position/Activity Restrictions: no ROM of R knee, NO ROM restrictions L knee, no shoulder ROM restrictions   Therapy/Group: Individual Therapy  Reche Ohara PT, DPT 02/24/2024, 1:03 PM

## 2024-02-24 NOTE — Progress Notes (Signed)
 Occupational Therapy Session Note  Patient Details  Name: Aaron Herrera MRN: [id number] Date of Birth: 2000-09-12  Today's Date: 02/24/2024 OT Individual Time: 1405-1500 OT Individual Time Calculation (min): 55 min    Short Term Goals: Week 3:  OT Short Term Goal 1 (Week 3): Pt will complete posterior pericare with Min A + PRN AE. OT Short Term Goal 2 (Week 3): Pt will perform sit<>stand with Mod A in preparation for standing ADLs. OT Short Term Goal 3 (Week 3): Pt will maintain static standce with Min A in preparation for standing ADLs.  Skilled Therapeutic Interventions/Progress Updates:  Pt greeted sitting in [facility] for skilled OT session with focus on community re-integration.   Pain: Pt with no reports of pain. OT offering intermediate rest breaks and positioning suggestions throughout session to address pain/fatigue and maximize participation/safety in session.   Functional Transfers: Pt remained at [facility] level.  Therapeutic Activities: Pt participates in dance group activity targeting general conditioning, #1 DB utilized. Session than transitioned to community re-integration activity by tasking patient with ordering meal from [employer]. Pt requires Min cues for WC positioning to access items such as straws and napkins (placed at higher than shoulder level) and positioning of food items on lap to allow independent propulsion. Pt demo's bright affect and even visits with acquaintance.   Pt remained sitting in WC with 4Ps assessed and immediate needs met. Pt continues to be appropriate for skilled OT intervention to promote further functional independence in ADLs/IADLs.   Therapy Documentation Precautions:  Precautions Precautions: Fall Precaution/Restrictions Comments: watch BP Required Braces or Orthoses: Knee Immobilizer - Right, Other Brace Knee Immobilizer - Right: On at all times Other Brace: R CAM boot, L post op shoe, L RNP splint, L wrist cock up  splint Restrictions Weight Bearing Restrictions Per Provider Order: Yes RUE Weight Bearing Per Provider Order: Weight bearing as tolerated LUE Weight Bearing Per Provider Order: Weight bearing as tolerated RLE Weight Bearing Per Provider Order: Weight bearing as tolerated LLE Weight Bearing Per Provider Order: Non weight bearing Other Position/Activity Restrictions: no ROM of R knee, NO ROM restrictions L knee, no shoulder ROM restrictions   Therapy/Group: Individual Therapy  Nereida Habermann, OTR/L, MSOT  02/24/2024, 4:56 PM

## 2024-02-24 NOTE — Progress Notes (Signed)
 Patient ID: Aaron Herrera, male   DOB: 2000-10-12, 24 y.o.   MRN: [id number]  40- SW spoke with [name] to discuss if they would be amenable to leaving on Friday due to potential bad weather. She will speak with her husband and will follow-up. She also confirms ramp will be installed today. SW waiting on follow-up.   Graeme Jude, MSW, LCSW Office: [phone] Cell: [phone] Fax: [phone]

## 2024-02-24 NOTE — Progress Notes (Signed)
 Occupational Therapy Session Note  Patient Details  Name: Aaron Herrera MRN: [id number] Date of Birth: 2000-03-27  Today's Date: 02/25/2024 OT Individual Time: 1020-1100 OT Individual Time Calculation (min): 40 min   Today's Date: 02/25/2024 OT Missed Time: 45 Minutes Missed Time Reason: Other (comment) (Discharge)   Short Term Goals: Week 3:  OT Short Term Goal 1 (Week 3): Pt will complete posterior pericare with Min A + PRN AE. OT Short Term Goal 2 (Week 3): Pt will perform sit<>stand with Mod A in preparation for standing ADLs. OT Short Term Goal 3 (Week 3): Pt will maintain static standce with Min A in preparation for standing ADLs.  Skilled Therapeutic Interventions/Progress Updates:   Session 1: Pt greeted sitting in [facility] for skilled OT session with focus on RLE bracing care and BADL completion at sink-side.   Pain: Pt reported 7/10 pain in RLE, LPN administers medication during session. OT offering intermediate rest breaks and positioning suggestions throughout session to address pain/fatigue and maximize participation/safety in session.   Functional Transfers: Pt remained seated in [facility] for entirety of session.   Self Care Tasks: Pt completes the following self care tasks with levels of assistance noted below, Dependent hair care provided for skin integrity and self-image/emotional wellness.  Dependent RLE bracing care and application on ace wraps for skin integrity/joint support (per patient request)  Pt remained sitting in WC with 4Ps assessed and immediate needs met. Pt continues to be appropriate for skilled OT intervention to promote further functional independence in ADLs/IADLs.   Session 2: Pt missing ~45 mins of skilled intervention due to discharge.   Therapy Documentation Precautions:  Precautions Precautions: Fall Precaution/Restrictions Comments: watch BP Required Braces or Orthoses: Knee Immobilizer - Right, Other Brace Knee Immobilizer - Right: On at all  times Other Brace: R CAM boot, L post op shoe, L RNP splint, L wrist cock up splint Restrictions Weight Bearing Restrictions Per Provider Order: Yes RUE Weight Bearing Per Provider Order: Weight bearing as tolerated LUE Weight Bearing Per Provider Order: Weight bearing as tolerated RLE Weight Bearing Per Provider Order: Weight bearing as tolerated LLE Weight Bearing Per Provider Order: Non weight bearing Other Position/Activity Restrictions: no ROM of R knee, NO ROM restrictions L knee, no shoulder ROM restrictions   Therapy/Group: Individual Therapy  Nereida Habermann, OTR/L, MSOT  02/25/2024, 12:43 PM

## 2024-02-24 NOTE — Plan of Care (Signed)
  Problem: Consults Goal: RH GENERAL PATIENT EDUCATION Description: See Patient Education module for education specifics. Outcome: Progressing   Problem: RH BLADDER ELIMINATION Goal: RH STG MANAGE BLADDER WITH ASSISTANCE Description: STG Manage Bladder With min Assistance Outcome: Progressing

## 2024-02-25 ENCOUNTER — Other Ambulatory Visit (HOSPITAL_COMMUNITY): Payer: Self-pay

## 2024-02-25 DIAGNOSIS — T07XXXA Unspecified multiple injuries, initial encounter: Secondary | ICD-10-CM | POA: Diagnosis not present

## 2024-02-25 NOTE — Progress Notes (Addendum)
 Occupational Therapy Discharge Summary  Patient Details  Name: AMARRION PASTORINO MRN: [id number] Date of Birth: 2000/10/24  Date of Discharge from OT service:February 25, 2024   Patient has met 8 of 8 long term goals due to improved activity tolerance, improved balance, postural control, ability to compensate for deficits, functional use of  RIGHT lower, LEFT upper, and LEFT lower extremity, and improved awareness.  Patient to discharge at [facility] Assist level. Patient's care partner is independent to provide the necessary physical assistance at discharge.    Reasons goals not met: N/A  Recommendation:  Patient will benefit from ongoing skilled OT services in outpatient setting to continue to advance functional skills in the area of BADL, iADL, School/Education, Vocation, and Reduce care partner burden.  Equipment: 18 x 18 WC, Hospital Bed, Slide-board To be purchased: Bariatric DABSC  Reasons for discharge: treatment goals met and discharge from hospital  Patient/family agrees with progress made and goals achieved: Yes  OT Discharge Precautions/Restrictions  Precautions Precautions: Fall Recall of Precautions/Restrictions: Intact Required Braces or Orthoses: Knee Immobilizer - Right;Other Brace (R CAM Boot) Knee Immobilizer - Right: On at all times Other Brace: R CAM boot, L post op shoe, L RNP splint, L wrist cock up splint Restrictions Weight Bearing Restrictions Per Provider Order: Yes RUE Weight Bearing Per Provider Order: Weight bearing as tolerated LUE Weight Bearing Per Provider Order: Weight bearing as tolerated RLE Weight Bearing Per Provider Order: Weight bearing as tolerated (FOR TRANSFERS ONLY) LLE Weight Bearing Per Provider Order: Non weight bearing Other Position/Activity Restrictions: No ROM restrictions of B knees and R shoulder ADL ADL Equipment Provided: Reacher, Long-handled sponge, Leg lifter (Looped gait belt.) Eating: Modified independent Where  Assessed-Eating: Wheelchair Grooming: Modified independent Where Assessed-Grooming: Sitting at sink Upper Body Bathing: Modified independent Where Assessed-Upper Body Bathing: Sitting at sink Lower Body Bathing: Modified independent Where Assessed-Lower Body Bathing: Edge of bed Upper Body Dressing: Independent Where Assessed-Upper Body Dressing: Sitting at sink Lower Body Dressing: Minimal assistance Where Assessed-Lower Body Dressing: Edge of bed Toileting: Minimal assistance Where Assessed-Toileting: Bedside Commode (Bariatric drop-arm BSC.) Toilet Transfer: Modified independent Statistician Method: Scientist, Research (life Sciences): Extra wide drop arm bedside commode Tub/Shower Transfer: Not assessed Tub/Shower Transfer Method: Unable to assess Film/video Editor: Not assessed Film/video Editor Method: Unable to assess Vision Baseline Vision/History: 1 Wears glasses Patient Visual Report: No change from baseline Vision Assessment?: No apparent visual deficits Perception  Perception: Within Functional Limits Praxis Praxis: WFL Cognition Cognition Overall Cognitive Status: Within Functional Limits for tasks assessed Arousal/Alertness: Awake/alert Orientation Level: Person;Place;Situation Memory: Appears intact Safety/Judgment: Appears intact Brief Interview for Mental Status (BIMS) Repetition of Three Words (First Attempt): 3 Temporal Orientation: Year: Correct Temporal Orientation: Month: Accurate within 5 days Temporal Orientation: Day: Correct Recall: Sock: No, could not recall Recall: Blue: No, could not recall Recall: Bed: Yes, no cue required BIMS Summary Score: 11 Sensation Sensation Light Touch: Impaired Detail Peripheral sensation comments: Reports numbness in L thumb, elbow, and outside of L knee. Light Touch Impaired Details: Impaired LUE;Impaired LLE Coordination Gross Motor Movements are Fluid and Coordinated: No Fine Motor  Movements are Fluid and Coordinated: No Coordination and Movement Description: Deficits due to multiple orthopedic injuries and subsequent WB precations. 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 Significant improvement from evaluation. Motor  Motor Motor: Other (comment) Motor - Discharge Observations: Deficits due to multiple orthopedic injuries and subsequent WB precations. 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 Significant  improvement from evaluation. Mobility  Mod I for Slide-Board Transfers  Trunk/Postural Assessment  Cervical Assessment Cervical Assessment: Within Functional Limits Thoracic Assessment Thoracic Assessment: Within Functional Limits Lumbar Assessment Lumbar Assessment: Exceptions to [facility] (Posterior pelvic tilt) Postural Control Postural Control: Deficits on evaluation Righting Reactions: Adequate in sitting Protective Responses: Adequate in sitting  Balance Balance Balance Assessed: Yes Static Sitting Balance Static Sitting - Balance Support: Feet supported;No upper extremity supported Static Sitting - Level of Assistance: 7: Independent Dynamic Sitting Balance Dynamic Sitting - Balance Support: During functional activity Dynamic Sitting - Level of Assistance: 6: Modified independent (Device/Increase time) Extremity/Trunk Assessment RUE Assessment RUE Assessment: Exceptions to [facility] Passive Range of Motion (PROM) Comments: WFL Active Range of Motion (AROM) Comments: WFL General Strength Comments: 3-/5 LUE Assessment LUE Assessment: Exceptions to [facility] Active Range of Motion (AROM) Comments: WFL grossly General Strength Comments: 3/5 proximally   Nereida Habermann, OTR/L, MSOT  02/25/2024, 7:53 AM

## 2024-02-25 NOTE — Progress Notes (Signed)
 Physical Therapy Discharge Summary  Patient Details  Name: Aaron Herrera MRN: [id number] Date of Birth: Apr 22, 2000  Date of Discharge from PT service:February 25, 2024  Today's Date: 02/25/2024 PT Individual Time: 0935-1015 PT Individual Time Calculation (min): 40 min    Patient has met 5 of 7 long term goals due to improved activity tolerance, improved balance, improved postural control, increased strength, decreased pain, and ability to compensate for deficits.  Patient to discharge at a wheelchair level Modified Independent.   Patient's care partner is independent to provide the necessary physical assistance at discharge.  Reasons goals not met: Pt continues to require moderate assist for sit to stands, mod assist for stand pivot transfers to car however pt plans to DC home with medical transport  Recommendation:  Patient will benefit from ongoing skilled PT services in outpatient setting once weightbearing limitations have progressed to continue to advance safe functional mobility, address ongoing impairments in gait mechanics, transfers, ROM, BLE strength, dynamic standing balance, and minimize fall risk.  Equipment: Hospital bed, slideboard, 18x18 [facility]  Reasons for discharge: treatment goals met and discharge from hospital  Patient/family agrees with progress made and goals achieved: Yes  PT Discharge Skilled Treatment Interventions: SESSION 1: Pt presents in room in bed, agreeable to PT. Pt does not report pain at this time. Session focused on therapeutic activities to promote improved stand pivot transfers for car transfer. Pt completes bed mobility and slideboard transfer modI during session. Pt able to manage all WC parts modI during session, education provided on where to park [facility] for completing bed mobility modI at DC with pt verbalizing understanding. Pt self propels WC with BUEs modI to ortho gym. Pt trials sit to stand from [facility] with max assist x1 however pt unable to bring LUE  onto RW. Pt requires mod assist x2 for sit to stand from [facility] to RW, mod assist for stand pivot back to [facility] with verbal cues provided on COG positioning over BOS with transfer. Pt completes x3 stand pivot transfers requiring mod assist x2 for sit to stand from [facility] height, mod assist x1 for stand pivot from elevated mat height. Pt returns to room self propelling >300' modI and remains seated in [facility] with all needs within reach, cal light in place at end of session.   Precautions/Restrictions Precautions Precautions: Fall Recall of Precautions/Restrictions: Intact Required Braces or Orthoses: Knee Immobilizer - Right;Other Brace (R CAM Boot) Knee Immobilizer - Right: On at all times Other Brace: R CAM boot, L post op shoe, L RNP splint, L wrist cock up splint Restrictions Weight Bearing Restrictions Per Provider Order: Yes RUE Weight Bearing Per Provider Order: Weight bearing as tolerated LUE Weight Bearing Per Provider Order: Weight bearing as tolerated RLE Weight Bearing Per Provider Order: Weight bearing as tolerated (FOR TRANSFERS ONLY) LLE Weight Bearing Per Provider Order: Non weight bearing Other Position/Activity Restrictions: No ROM restrictions of B knees and R shoulder Pain Interference Pain Interference Pain Effect on Sleep: 1. Rarely or not at all Pain Interference with Therapy Activities: 2. Occasionally Pain Interference with Day-to-Day Activities: 1. Rarely or not at all Vision/Perception  Vision - History Ability to See in Adequate Light: 0 Adequate Perception Perception: Within Functional Limits Praxis Praxis: WFL  Cognition Overall Cognitive Status: Within Functional Limits for tasks assessed Arousal/Alertness: Awake/alert Memory: Appears intact Safety/Judgment: Appears intact Sensation Sensation Light Touch: Impaired Detail Coordination Gross Motor Movements are Fluid and Coordinated: No Fine Motor Movements are Fluid and Coordinated: No Coordination and  Movement  Description: Deficits due to multiple orthopedic injuries and subsequent WB precations. 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 Significant improvement from evaluation. Motor  Motor Motor: Other (comment) Motor - Discharge Observations: Deficits due to multiple orthopedic injuries and subsequent WB precations. 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 Significant improvement from evaluation.  Mobility Transfers Transfers: Lateral/Scoot Transfers Lateral/Scoot Transfers: Independent with assistive device Transfer (Assistive device): Other (Comment) (Slide board) Locomotion  Gait Ambulation: No Gait Gait: No Stairs / Additional Locomotion Stairs: No Corporate Treasurer: Yes Wheelchair Assistance: Independent with Scientist, Research (life Sciences): Both upper extremities Wheelchair Parts Management: Independent  Trunk/Postural Assessment  Cervical Assessment Cervical Assessment: Within Film/video Editor Assessment: Within Functional Limits Lumbar Assessment Lumbar Assessment: Exceptions to [facility] (Posterior pelvic tilt) Postural Control Postural Control: Deficits on evaluation Righting Reactions: Adequate in sitting Protective Responses: Adequate in sitting  Balance Balance Balance Assessed: Yes Static Sitting Balance Static Sitting - Balance Support: Feet supported;No upper extremity supported Static Sitting - Level of Assistance: 7: Independent Dynamic Sitting Balance Dynamic Sitting - Balance Support: During functional activity Dynamic Sitting - Level of Assistance: 6: Modified independent (Device/Increase time) Extremity Assessment  RLE Assessment RLE Assessment: Exceptions to [facility] General Strength Comments: grossly 3/5, limited by ROM restrictions LLE Assessment LLE Assessment: Exceptions to [facility] General Strength Comments: grossly 3/5, limited by ROM   Reche Ohara PT, DPT 02/25/2024, 8:43 AM

## 2024-02-25 NOTE — Progress Notes (Signed)
 Inpatient Rehabilitation Care Coordinator Discharge Note   Patient Details  Name: Aaron Herrera MRN: [id number] Date of Birth: [birth year]   Discharge location: D/c to home  Length of Stay: 23 days  Discharge activity level: wheelchair level Modified Independent  Home/community participation: Limited  Patient response un:Yzjouy Literacy - How often do you need to have someone help you when you read instructions, pamphlets, or other written material from your doctor or pharmacy?: Rarely  Patient response un:Dnrpjo Isolation - How often do you feel lonely or isolated from those around you?: Rarely  Services provided included: MD, RD, PT, OT, RN, TR, Pharmacy, Neuropsych, SW, CM  Financial Services:  Field Seismologist Utilized: [employer] [place] [employer]  Choices offered to/list presented to: patient and family  Follow-up services arranged:  DME, Other (Comment) (therapys is deferred due until WB restrictions are lifted)      DME : [employer] for hosipital bed and wheelchair    Patient response to transportation need: Is the patient able to respond to transportation needs?: Yes In the past 12 months, has lack of transportation kept you from medical appointments or from getting medications?: No In the past 12 months, has lack of transportation kept you from meetings, work, or from getting things needed for daily living?: No   Patient/Family verbalized understanding of follow-up arrangements:  Yes  Individual responsible for coordination of the follow-up plan: contact pt  Confirmed correct DME delivered: Aaron Herrera 02/25/2024    Comments (or additional information):fam edu completed  Summary of Stay    Date/Time Discharge Planning CSW  02/22/24 1048 Pt d/c to home with his parents. Mother is not able to provide any physical assistance. Pt father works and helpful in the evening due to work. PCS referral submitted to insurance. Wife reports  ramp will likely not be in by discharge and more like Friday. SW will confirm there are no barriers to discharge. AAC  02/14/24 1053 Pt d/c to home with his parents. Mother is not able to provide any physical assistance. Pt father works and helpful in the evening due to work. PCS referral submitted to insurance. SW will confirm there are no barriers to discharge. AAC  02/08/24 1037 Pt d/c to home with his parents. Mother is not able to provide any physical assistance. Pt father works and helpful in the evening due to work. SW will confirm there are no barriers to discharge. AAC       Aaron Herrera A Herrera

## 2024-02-25 NOTE — Progress Notes (Signed)
 Discharge instructions were reviewed with patient and mother Dorothe via telephone and they both verbalized agreement.  Medications have been sent to [facility] at their request. All questions answered.

## 2024-02-25 NOTE — Progress Notes (Signed)
 Patient ID: Aaron Herrera, male   DOB: Apr 24, 2000, 24 y.o.   MRN: [id number]  *SW received message from pt mother reporting that DME was delivered and ramp is installed.   1105- SW spoke with Nicole/[employer] [phone]) scheduling ambulance transportation to home. Trip [phone]; pick up is for 215pm; nursing number provided for vendor to call if any changes to the pick up time. Pick up window is 3hrs- unsure if this is an accurate pick up time.   1118- SW called [name] to inform on above.   *SW met with pt to discuss above.   Graeme Jude, MSW, LCSW Office: [phone] Cell: [phone] Fax: [phone]

## 2024-02-25 NOTE — Progress Notes (Signed)
 Inpatient Rehabilitation Discharge Medication Review by a Pharmacist  A complete drug regimen review was completed for this patient to identify any potential clinically significant medication issues.  High Risk Drug Classes Is patient taking? Indication by Medication  Antipsychotic No   Anticoagulant Yes Apixaban  - DVT px  Antibiotic No   Opioid Yes Oxycodone  - pain  Antiplatelet No   Hypoglycemics/insulin No   Vasoactive Medication Yes Tamsulosin  - urinary retention  Chemotherapy No   Other Yes APAP - pain Elavil /buspar  - neuropathy/anxiety Vitamin B12/C/D - supplement Gabapentin  - pain Hydroxyzine  - anxiety Miralax /senokot - constipation Robaxin  - spasms Albuterol  - short of breath Zyrtec - allergy     Type of Medication Issue Identified Description of Issue Recommendation(s)  Drug Interaction(s) (clinically significant)     Duplicate Therapy     Allergy     No Medication Administration End Date     Incorrect Dose     Additional Drug Therapy Needed     Significant med changes from prior encounter (inform family/care partners about these prior to discharge).    Other       Clinically significant medication issues were identified that warrant physician communication and completion of prescribed/recommended actions by midnight of the next day:  No  Name of provider notified for urgent issues identified:   Provider Method of Notification:     Pharmacist comments:   Time spent performing this drug regimen review (minutes):  [address], PharmD, [place], AAHIVP, CPP Infectious Disease Pharmacist 02/25/2024 8:07 AM

## 2024-02-25 NOTE — Progress Notes (Signed)
 "                                                        PROGRESS NOTE   Subjective/Complaints:  No events overnight.   Vital stable.  No concerns about discharge today. States he prefers to keep his right leg wrapped due to feeling of rubbing against the brace.  ROS: Patient denies fever, chills,  rash, sore throat, blurred vision, dizziness, nausea, vomiting, diarrhea, cough, shortness of breath or chest pain,   headache, or mood change.  + Anxiety--well-controlled + Bilateral knee pain, right ankle pain--stable  Objective:   No results found.    Recent Labs    02/24/24 0438  WBC 2.8*  HGB 11.6*  HCT 35.8*  PLT 235      No results for input(s): NA, K, CL, CO2, GLUCOSE, BUN, CREATININE, CALCIUM  in the last 72 hours.      Intake/Output Summary (Last 24 hours) at 02/25/2024 1258 Last data filed at 02/25/2024 0857 Gross per 24 hour  Intake 354 ml  Output 1575 ml  Net -1221 ml        Physical Exam: Vital Signs Blood pressure 135/79, pulse 94, temperature 98.2 F (36.8 C), temperature source Oral, resp. rate 17, height 5' 11 (1.803 m), weight 104.5 kg, SpO2 97%.  Constitutional: No distress . Vital signs reviewed.  Sitting up in bedside wheelchair.   Cardiovascular: RRR without murmur. No JVD  .  Respiratory/Chest: CTA Bilaterally without wheezes or rales. Normal effort    GI/Abdomen: BS +, non-tender, non-distended Ext: no clubbing, cyanosis; trace right lower extremity edema--unchanged Psych: pleasant, cooperative. Calm  Neurologic Exam:    Sensory exam: revealed normal sensation in all dermatomal regions in bilateral lower extremities, right upper extremity, and with reduced sensation to light touch in L dorsal thumb-unchanged 1-30  Motor exam:  strength 5/5 throughout right upper extremity  LUE 4-/5 FF, 0-tr/5 FA, 0/5 WE, 4+/5 EF, 4+/5 EE, 5-/5 SA   Antigravity against resistance bilateral lower extremities, approximately 5-/5 throughout  but complicated by bracing/weightbearing restrictions   MSK:  No apparent deformity   Skin: Incisions and abrasions on bilateral lower extremities healing well--all well-approximated and scabbed over 1/30 Small, palpable area of hardness on left mid femur; mobile, slightly tender, nonpulsatile--unchanged 1/30     Assessment/Plan: 1. Functional deficits which require 3+ hours per day of interdisciplinary therapy in a comprehensive inpatient rehab setting. Physiatrist is providing close team supervision and 24 hour management of active medical problems listed below. Physiatrist and rehab team continue to assess barriers to discharge/monitor patient progress toward functional and medical goals  Care Tool:  Bathing    Body parts bathed by patient: Right arm, Left arm, Chest, Abdomen, Front perineal area, Buttocks, Right upper leg, Left upper leg, Left lower leg, Face (Use of long-handled sponge.)   Body parts bathed by helper: Buttocks Body parts n/a: Right lower leg (Due to surgical bracing.)   Bathing assist Assist Level: Set up assist     Upper Body Dressing/Undressing Upper body dressing   What is the patient wearing?: Pull over shirt    Upper body assist Assist Level: Independent with assistive device    Lower Body Dressing/Undressing Lower body dressing      What is the patient wearing?: Pants, Incontinence brief  Lower body assist Assist for lower body dressing: Minimal Assistance - Patient > 75%     Toileting Toileting    Toileting assist Assist for toileting: 2 Helpers     Transfers Chair/bed transfer  Transfers assist  Chair/bed transfer activity did not occur: Safety/medical concerns (orthostatic hypotension)  Chair/bed transfer assist level: Total Assistance - Patient < 25%     Locomotion Ambulation   Ambulation assist   Ambulation activity did not occur: Safety/medical concerns          Walk 10 feet activity   Assist  Walk 10 feet  activity did not occur: Safety/medical concerns        Walk 50 feet activity   Assist Walk 50 feet with 2 turns activity did not occur: Safety/medical concerns         Walk 150 feet activity   Assist Walk 150 feet activity did not occur: Safety/medical concerns         Walk 10 feet on uneven surface  activity   Assist Walk 10 feet on uneven surfaces activity did not occur: Safety/medical concerns         Wheelchair     Assist Is the patient using a wheelchair?: Yes Type of Wheelchair: Manual Wheelchair activity did not occur: Safety/medical concerns         Wheelchair 50 feet with 2 turns activity    Assist    Wheelchair 50 feet with 2 turns activity did not occur: Safety/medical concerns       Wheelchair 150 feet activity     Assist  Wheelchair 150 feet activity did not occur: Safety/medical concerns       Blood pressure 135/79, pulse 94, temperature 98.2 F (36.8 C), temperature source Oral, resp. rate 17, height 5' 11 (1.803 m), weight 104.5 kg, SpO2 97%.  Medical Problem List and Plan: 1. Functional deficits secondary to polytrauma             -patient may shower with surgical dressings covered             -ELOS/Goals: 12-16 days, Mod A PT/OT at [facility] level - 02/23/24, pending WC ramp being built--> 1/30 DC pending transport/equipment  -Continue CIR therapies including PT, OT,SLP    Weight bearing: WBAT B/L upper extremities. May weight bear on right leg for transfers in boot and knee immobilizer NWB Left leg and should wear post op shoe on left foot when transferring    Splinting: L radial nerve palsy splint (finger bands) during daytime hours PRN for exercise and functional tasks; SLEEP in black wrist cock-up splint and wear PRN during the day when not wearing other splint to prevent wrist drop.    - 1/13: DC home with parents, mom primary caregiver and concerned that she can;t Do much; dad works during daytime. Max A bed mobility  very limited by pain and anxiety - up to EOB yesterday - trying slideboard transfer today.  Min A UB, Max A LB care. Downgrading goals to Min A. SLP mild cognitive deficits  - SPV to ind with memory as biuggest deficit.    -1-15: Weightbearing status per orthopedics as below.    To follow-up with Dr. Kendal 2 weeks after DC for x-rays of the left humerus and left knee, and Dr.Gebauer/Landau for bilateral tibia left femur and left toe fractures  -1/20: Mom working on remodeling their home, widening doorways for his WC. SPV bed mobility, Min A slideboard transfers due to need to place but once down  he is supervision, SPV WC ambulation, STS Max A 2x yesterday. Goal SPT for car and bed transfers. Setup UB care, Mod-Max A LB care due to pain but is doing well with adaptive techniques with the reacher this week. [employer] putting in temporary ramp--discussed keeping in touch with us  on a date that this will be completed to help coordinate discharge plans.  - 1-23: Car transfer not feasible due to height; arranging medical transport for discharge. - 1/27: Honing in on STS - able to hop backwards and side to side with walker; working on car transfer. Using all adaptive equipment for ADLs, Graduated from SLP.    Patient would benefit from a hospital bed at discharge due to ongoing inability to transfer off of low surfaces and significant mobility difficulty in bed secondary to bilateral lower extremity fractures with left lower extremity remaining nonweightbearing.  In addition, he has multiple traumatic wounds in various stages of healing, and would benefit from a bed that can assist in pressure offloading and repositioning with adjusting recline/head height.  1-29: Moving discharge tomorrow to avoid inclement weather over the weekend.  Late transport arranged.  The patient is medically ready for discharge to home and will need follow-up with [facility] PM&R. In addition, they will need to follow up with their  PCP, Orthopedics.     2.  Antithrombotics: -DVT/anticoagulation:  Mechanical: Sequential compression devices, below knee Bilateral lower extremities Pharmaceutical: Lovenox              -antiplatelet therapy: n/a             -BL LE duplex dopplers negative   - 1-15 per Ortho, transition to Eliquis  2.5 mg twice daily for 30 days for DVT prophylaxis at discharge - will start transition to Eliquis  today for patient comfort on 1-17; last Lovenox  dose tonight 1/16--tolerating   3. Pain Management: Tylenol  1000 mg q6h, gabapentin  300 mg 3 times daily.  Oxycodone  10 mg q4h and  Robaxin  500 mg q6 PRN.   1/8: Reduce tylenol  to 1000 mg TID, schedule robaxin  500 mg QID, schedule oxycodone  5 mg QID and reduce PRN to 5 mg Q6H PRN  1/11 still chasing pain at times. Pain wakes him up overnight   -will change scheduled oxycodone  5 q6 to oxycontin , titrate that if needed.   - 1-12: Monitor on OxyContin   - 1-13: Increase Robaxin  to 1000 mg every 6 hours scheduled, add tizanidine  2 mg every 6 hours as needed, and encouraged use of as needed oxycodone  prior to therapies  - 1/14: Increase PRN oxycodone  to 5-10 mg for moderate to severe pain; increase at bedtime gabapentin  to 600 mg to assist in at bedtime control  1-15: Using mostly oxycodone  10 overnight, pain remains 6-7 and interrupting sleep.  Results with increase gabapentin .  We discussed the patient today adding Elavil  versus scheduling tizanidine --will add Elavil  25 mg nightly.  1-16: Slept great with Elavil , continue current regimen  1-18: Doing well with current pain regimen, would start weaning OxyContin  this coming week  1/19 has not been using frequent oxycodone , reports pain is controlled.  Will continue current regimen for for today, could consider discontinue OxyContin  in the next day or two  - 1-22: DC OxyContin  this a.m.; monitor oxycodone  use over the next few days to gradually wean  1-23: Is using oxycodone  about once per day; reduce Robaxin  to  1000 mg 3 times daily, gabapentin  to 300 mg 3 times daily.  DC tizanidine .  Monitor oxycodone  use, will  likely give short supply at discharge and then wean as outpatient.  -1/24 only used oxycodone  once last night and once earlier this morning.  Continue current regimen and monitor  -1/25 pain overall controlled, using as needed oxycodone  about daily--wishes to continue current regimen--will give 1 week supply at discharge  4. Mood/Behavior/Sleep: LCSW to follow for evaluation and support when available.              -anxiety: d/t poor sleep, worry, and claustrophobia; denies nightmares or re-living traumatic event. Continue hydroxyzine  at bedtime.              -sleep: melatonin 5 mg at bedtime.   - 1-8: Not using as needed Atarax , continue nightly scheduled and DC 4 times daily as needed.  If anxiety ongoing limiting factor, will discuss initiation of BuSpar .  1/9-10: Started BuSpar  7.5 mg twice daily.  Encouraged use of as needed Atarax  at night if needed--monitor this weekend.  1/11- some improvement but will increase to 10mg  bid as anxiety still a factor --monitor 1 to 2 days on increased regimen 1-13: Patient endorses this is generally well-controlled, major triggers using bedpan but otherwise doing better 1/14: Sleep interruptions due to pain, melatonin not effective, change to PRN trazodone  50 mg at bedtime. Consider adding elavil  at bedtime if gabapentin  increase not effective.  1-15: Adding Elavil  25 mg nightly-did well with this, slightly groggy this morning but tolerable. --1/26: Discussed moving Atarax  to as needed, patient wishes to keep scheduled at nighttime, no additional adjustments.  Can titrate further as outpatient.   5. Neuropsych/cognition: Psych consult ordered 1/6. Neuropsych Consult ordered in CIR.  This patient is capable of making decisions on his own behalf.  - Graduated from SLP   6. Skin/Wound Care: Routine pressure relief measures.               - 1 week post-op;  will reach out to orthopedic surgical teams regarding timeline for suture/staple removal, RLE wrapping   -- Per Dr. Reyne, okay to remove sutures/staples. Ordered 1-8 - 1/9: Per Lauraine Moores, left humerus and right tibial incisions can be left open to air.  Per Dr. Josefina, right ankle dressing to remain intact unless saturated.   1-13: Remove remaining staples in the left proximal thigh and right knee--reminded nursing on 1-14 1-14: Ortho eval today:  LUE - ok to leave incision open to air LLE - ok to leave knee incision open to air RLE - Staples to be removed today and dressing change PRN 1-16: Nursing to remove residual staples/sutures on right ankle  1-22: Unwrapped and looked at bilateral lower extremities; all areas healing well 1-27: Right lower extremity unwrapped, concern for sweating/moisture retention and odor under current bandaging so we will leave open to air for now.  Informed nursing, can reinforce specific areas with Mepilex if there is any rubbing against the brace--patient can rewrap right lower extremity as needed due to liking the compression and buffer against brace  - 1/30: Patient asking if he can have some time out of his right lower extremity brace to allow wounds to stay dry due to current moisture trapping; stated when he is laying in bed not moving with his knee extended he should be able to take small breaks but to remember to reapply bracing before moving.  He is agreeable to this.  7. Fluids/Electrolytes/Nutrition: Monitor strict I&O and weights. Follow up labs CBC/CMP               -  Regular diet + Ensure and vitamin supplements              - Good p.o. intakes overall   8.  Splenic laceration: 2 cm, monitor hemoglobin with CBC.   9.  Possible liver laceration: Small left zone 2/3 retroperitoneal hematoma.  Same as above   10.  ABLA: S/p 4 units PRBC, 1 unit whole blood, 2 unit FFP on 12/30.  Hemoglobin 8.9 from 8.8.  PLTs improved now to 63.               -  1-7: Stable on admission labs, follow-up labs   1-19 hemoglobin stable 10.4  11.  AKI: Related to hypovolemia on admission, resolved.  Encourage oral hydration. Monitor BMP               - 1/7: BUN/creatinine stable; monitor, stable at 1-7   - 1-12: A.m. labs stable, continue to encourage p.o. fluids, blood pressure holding up better   - BUN and creatinine stable    12.  Tachycardia/hypertension: Continue Lopressor  25 mg twice daily.               - 1/7: Hypotensive with therapies + tachycardic - 500 cc IVF bolus today--responsive   - 1/8: Remains tachycardic 120s; titrate pain regimen as above; repeat orthostats and add 75 cc/hr drip if +; encourage PO fluids - may need to increase lopressor   1/11 HR a little better this morning (105) -continue lopressor  at 50mg  bid to see if this better controls without dropping bp too much -rx pain and anxiety as above 1/12-19: Improving with pain control, monitor. 1-20: Reducing pain medications tomorrow morning, monitor for recurrent tachycardia, then wean Lopressor  if tolerated. 1-22: Reduce Lopressor  to 25 mg twice daily 1-23: Lopressor  to 12.3 mg twice daily, if heart rate and blood pressure stays stable can DC Lopressor  1/24 -1/24 stable, DC Lopressor  -heart rate stable continue current regimen and monitor     02/25/2024    4:30 AM 02/24/2024    1:44 PM 02/24/2024    4:51 AM  Vitals with BMI  Systolic 135 143 875  Diastolic 79 85 63  Pulse 94 87 84      13.  Urinary retention: TOV failed 1/1 & 1/5. Foley replaced on 1/6 - Continue Flomax .  1/7 Flomax  moved to at bedtime due to orthostasis.  1/8: + BM yesterday; will plan foley trial Monday 1/12 --order 1/13-14: Continues with high volume output but has been continent of all voids, continue current regimen -1-15 urinary retention resolved, continent of all voids.  Will continue Flomax  for now given ongoing high-volume output and poor mobility. 1-17: High bladder scan yesterday, increase  Flomax  to 0.8 mg after supper. 1-18: PVRs low.  Patient endorsing difficulty voiding only when he is laying down.  Encouraged upright seated position or bedside commode for voids.  DC PVRs. - 1-28: Some increased difficulty with urination today.  Will resume PVRs, no symptoms indicative of UTI.  14.  Constipation: received mag citrate 1/6 LBM unsure of success. KUB ordered.              -Miralax  increased to BID. Colace BID, as needed enema.              1/7: Straining with orthostasis - give bowel prep  1/8: medium BM with suppository and prep - schedule miralax  17 g BID, sennakot S 1 tab BID; encourage PO fluids as above  1-9: Increase Senokot to 2 tabs  twice daily   - Multiple large BMs 1-11.   1-13: Patient endorses trouble using bedpan as huge source of anxiety, encouraged to practice transfers to assist in changing this. 1/14: Monitor through today; may need sorbitol if no BM tonight Last bowel movement 1-14, large.  Still requiring enema. 1-15: Increase Senokot-S to 3 tabs twice daily, continue MiraLAX  17 g twice daily, add as needed lactulose  10 g for moderate constipation.  1-17 medium bowel movement  1-18: 2 bowel movements yesterday, starting to pick up.  Reduce Senokot-S to 1 tab twice daily  Last bowel movement 1-28: Medium  15. Right 4 through 7, left 9 through 10 rib fractures w/pulmonary contusions: Continue pain control and pulmonary toilet.             - Encourage incentive spirometer and flutter valve.  16. L2-5 transverse process fractures - Pain control. 17. Right scapular spine fracture - Dr. Reyne consulted. This to be treated nonoperatively.  18. Right femur fracture - s/p IM rod 12/30 Dr. Reyne.   - 1/14: RLE knee immobilizer, ok for hip ROM; WBAT for transfers only in boot and KI  19. Open right tib/fib fracture - s/p IM rod right tibia 12/30 Dr. Reyne, ORIF R medial malleolus, Right partial patellectomy Dr. Josefina.   - 1/14: WBAT for tranfers only in boot  and KI  20. Left humeral fracture - ORIF 12/31 Dr. Kendal.   - 1/14: WBAT w/ radial nerve palsy splint  21. Left femur fracture - s/p IM rod 12/30 Dr. Josefina.  - 1/ 26: Patient concerned about persistent swelling over fracture site; will get x-ray today, but provided reassurance this is likely hematoma -1/27: Kemp Gerard Eke PA reviewed images, no migration of bone fragments and again swelling is likely hematoma/seroma.  Recommend ice, heat, compression and lidocaine  patches to help with local symptoms.  Will reevaluate as outpatient.  Reviewed this plan with patient today.  22. Left foot fractures - s/p closed reduction left toes 12/30. 23. Left tibial plateau fracture -  washed out 12/30 Dr. Reyne, ORIF 12/31 Hr. Haddix.     - 1/14: LLE unrestricted ROM, NWB  24. Obesity. Body mass index is 32.13 kg/m. Complicates all aspects of care.   LOS: 24 days A FACE TO FACE EVALUATION WAS PERFORMED  Joesph JAYSON Likes 02/25/2024, 12:58 PM     "

## 2024-02-25 NOTE — Plan of Care (Signed)
" °  Problem: Sit to Stand Goal: LTG:  Patient will perform sit to stand with assistance level (PT) Description: LTG:  Patient will perform sit to stand with assistance level (PT) Outcome: Adequate for Discharge   Problem: RH Car Transfers Goal: LTG Patient will perform car transfers with assist (PT) Description: LTG: Patient will perform car transfers with assistance (PT). Outcome: Adequate for Discharge   Problem: RH Balance Goal: LTG Patient will maintain dynamic sitting balance (PT) Description: LTG:  Patient will maintain dynamic sitting balance with assistance during mobility activities (PT) Outcome: Completed/Met   Problem: RH Bed Mobility Goal: LTG Patient will perform bed mobility with assist (PT) Description: LTG: Patient will perform bed mobility with assistance, with/without cues (PT). Outcome: Completed/Met   Problem: RH Bed to Chair Transfers Goal: LTG Patient will perform bed/chair transfers w/assist (PT) Description: LTG: Patient will perform bed to chair transfers with assistance (PT). Outcome: Completed/Met   Problem: RH Wheelchair Mobility Goal: LTG Patient will propel w/c in controlled environment (PT) Description: LTG: Patient will propel wheelchair in controlled environment, # of feet with assist (PT) Outcome: Completed/Met Goal: LTG Patient will propel w/c in home environment (PT) Description: LTG: Patient will propel wheelchair in home environment, # of feet with assistance (PT). 02/25/2024 1455 by Chaya Mora, PT Outcome: Completed/Met 02/25/2024 0847 by Chaya Mora, PT Flowsheets (Taken 02/25/2024 [phone]) LTG: Pt will propel w/c in home environ  assist needed:: Independent with assistive device   "

## 2024-03-02 ENCOUNTER — Other Ambulatory Visit: Payer: Self-pay | Admitting: Student

## 2024-04-03 ENCOUNTER — Encounter: Admitting: Physical Medicine and Rehabilitation
